# Patient Record
Sex: Female | Born: 1945 | Race: White | Hispanic: No | Marital: Married | Smoking: Never smoker
Health system: Southern US, Community
[De-identification: ages and names within clinical notes are randomized; demographics above are authoritative.]

## PROBLEM LIST (undated history)

## (undated) DIAGNOSIS — M545 Low back pain, unspecified: Secondary | ICD-10-CM

## (undated) DIAGNOSIS — K449 Diaphragmatic hernia without obstruction or gangrene: Secondary | ICD-10-CM

## (undated) DIAGNOSIS — K219 Gastro-esophageal reflux disease without esophagitis: Secondary | ICD-10-CM

## (undated) DIAGNOSIS — E785 Hyperlipidemia, unspecified: Secondary | ICD-10-CM

## (undated) DIAGNOSIS — M3500 Sicca syndrome, unspecified: Secondary | ICD-10-CM

## (undated) DIAGNOSIS — I10 Essential (primary) hypertension: Secondary | ICD-10-CM

## (undated) DIAGNOSIS — E039 Hypothyroidism, unspecified: Secondary | ICD-10-CM

## (undated) DIAGNOSIS — R519 Headache, unspecified: Secondary | ICD-10-CM

## (undated) HISTORY — DX: Gastro-esophageal reflux disease without esophagitis: K21.9

## (undated) HISTORY — PX: SINOSCOPY: SHX187

## (undated) HISTORY — DX: Essential (primary) hypertension: I10

## (undated) HISTORY — DX: Low back pain, unspecified: M54.50

## (undated) HISTORY — PX: OTHER SURGICAL HISTORY: SHX169

## (undated) HISTORY — DX: Hypothyroidism, unspecified: E03.9

## (undated) HISTORY — PX: SALIVARY GLAND SURGERY: SHX768

## (undated) HISTORY — PX: LAPAROSCOPY: SHX197

## (undated) HISTORY — DX: Diaphragmatic hernia without obstruction or gangrene: K44.9

## (undated) HISTORY — PX: BREAST LUMPECTOMY: SHX2

## (undated) HISTORY — PX: TONSILLECTOMY AND ADENOIDECTOMY: SUR1326

## (undated) HISTORY — PX: ADENOIDECTOMY: SUR15

---
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
## 1966-10-10 HISTORY — PX: SALIVARY GLAND SURGERY: SHX768

## 1999-06-02 ENCOUNTER — Other Ambulatory Visit: Admission: RE | Admit: 1999-06-02 | Discharge: 1999-06-02 | Payer: Self-pay | Admitting: *Deleted

## 1999-10-15 ENCOUNTER — Other Ambulatory Visit: Admission: RE | Admit: 1999-10-15 | Discharge: 1999-10-15 | Payer: Self-pay | Admitting: Surgery

## 2000-04-20 ENCOUNTER — Encounter: Payer: Self-pay | Admitting: *Deleted

## 2000-04-20 ENCOUNTER — Encounter: Admission: RE | Admit: 2000-04-20 | Discharge: 2000-04-20 | Payer: Self-pay | Admitting: *Deleted

## 2000-06-09 ENCOUNTER — Other Ambulatory Visit: Admission: RE | Admit: 2000-06-09 | Discharge: 2000-06-09 | Payer: Self-pay | Admitting: *Deleted

## 2001-04-30 ENCOUNTER — Encounter: Admission: RE | Admit: 2001-04-30 | Discharge: 2001-04-30 | Payer: Self-pay | Admitting: *Deleted

## 2001-04-30 ENCOUNTER — Encounter: Payer: Self-pay | Admitting: *Deleted

## 2001-06-26 ENCOUNTER — Other Ambulatory Visit: Admission: RE | Admit: 2001-06-26 | Discharge: 2001-06-26 | Payer: Self-pay | Admitting: *Deleted

## 2002-06-11 ENCOUNTER — Encounter: Admission: RE | Admit: 2002-06-11 | Discharge: 2002-06-11 | Payer: Self-pay | Admitting: *Deleted

## 2002-06-11 ENCOUNTER — Encounter: Payer: Self-pay | Admitting: *Deleted

## 2003-07-08 ENCOUNTER — Encounter: Admission: RE | Admit: 2003-07-08 | Discharge: 2003-07-08 | Payer: Self-pay | Admitting: *Deleted

## 2003-07-08 ENCOUNTER — Encounter: Payer: Self-pay | Admitting: *Deleted

## 2004-10-10 HISTORY — PX: NASAL SINUS SURGERY: SHX719

## 2004-10-26 ENCOUNTER — Encounter: Admission: RE | Admit: 2004-10-26 | Discharge: 2004-10-26 | Payer: Self-pay | Admitting: *Deleted

## 2005-03-31 ENCOUNTER — Encounter: Admission: RE | Admit: 2005-03-31 | Discharge: 2005-03-31 | Payer: Self-pay | Admitting: Otolaryngology

## 2005-03-31 IMAGING — CT CT NECK W/ CM
1 of 2 series · 9 of 14 positions shown, 12 images · IV contrast (OMNIOPAQUE 75CC)
Comparison: none

CLINICAL DATA: Mass at the posterior auricular region, question left submandibular calculus.  Prior salivary gland infection.
NECK CT WITH CONTRAST ? [DATE]:

[Series 2: neck · axial · 0.39mm/px · z∈[-37,+173]mm · 9 of 106 slices shown, 12 images]
[im 11/106  soft-tissue]
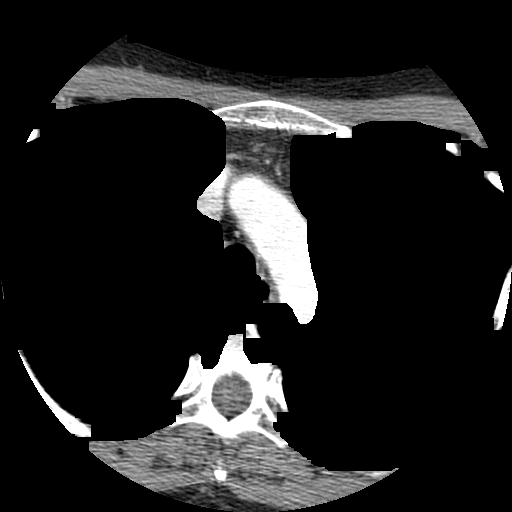
[im 11/106  bone]
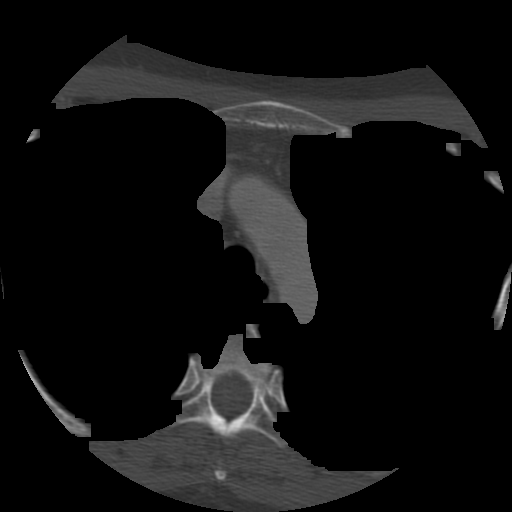
[im 22/106  bone]
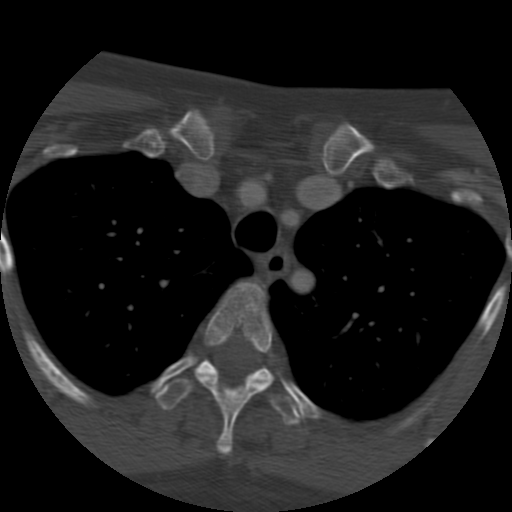
[im 32/106  bone]
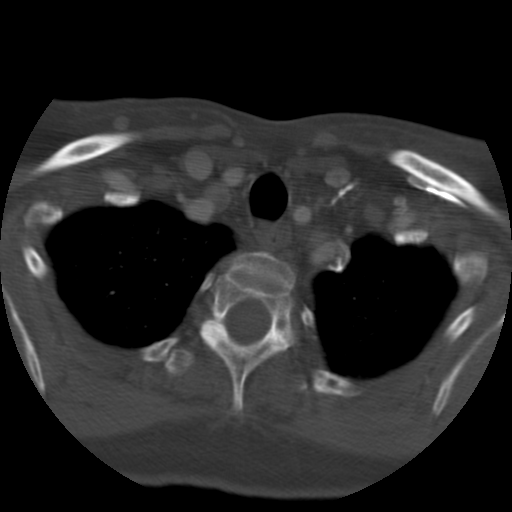
[im 43/106  bone]
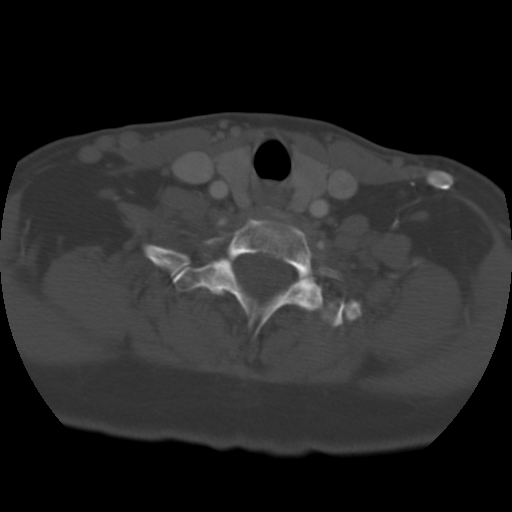
[im 53/106  soft-tissue]
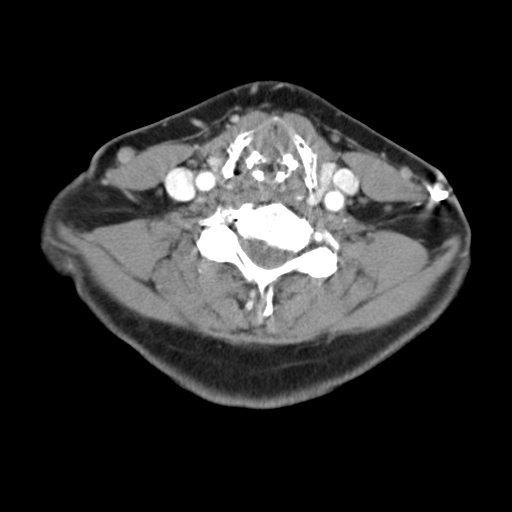
[im 53/106  bone]
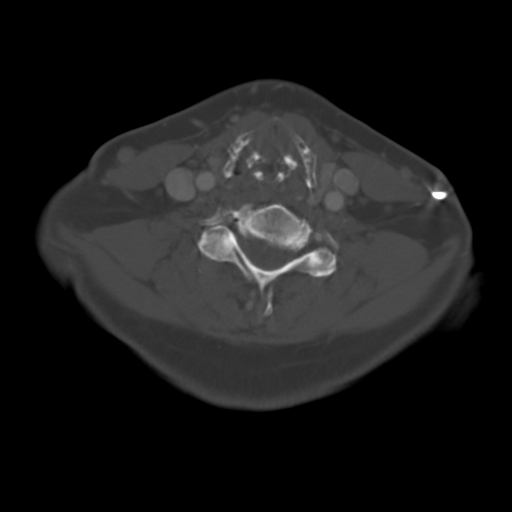
[im 64/106  bone]
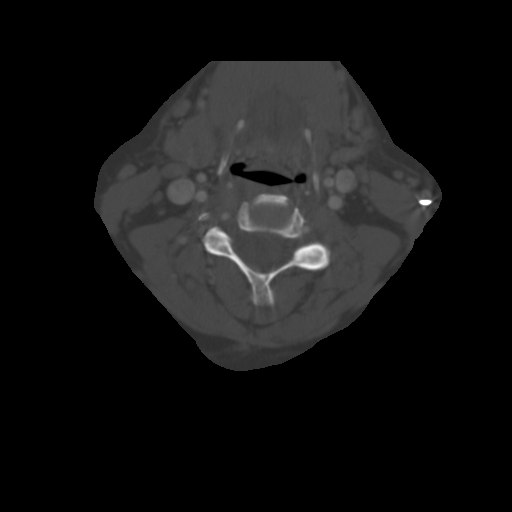
[im 74/106  bone]
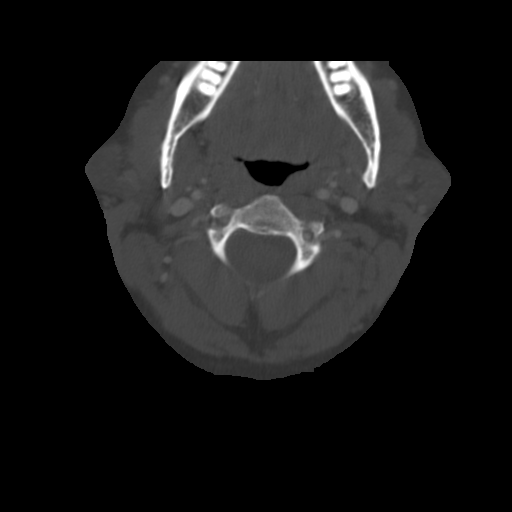
[im 85/106  bone]
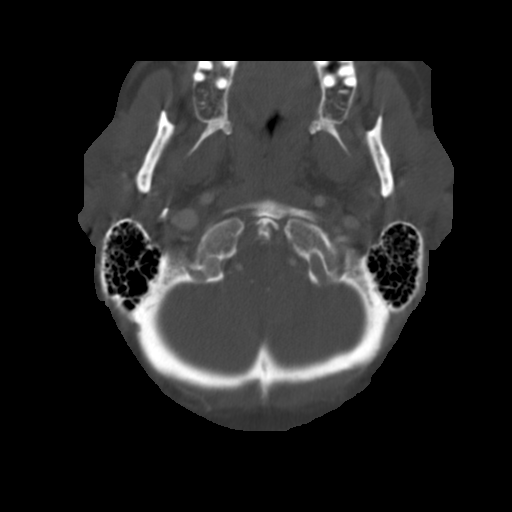
[im 95/106  soft-tissue]
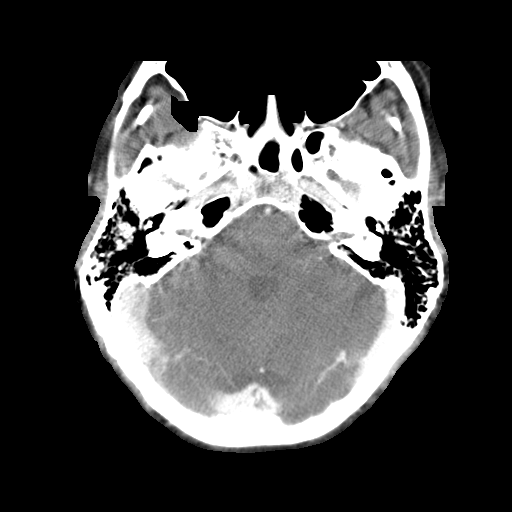
[im 95/106  bone]
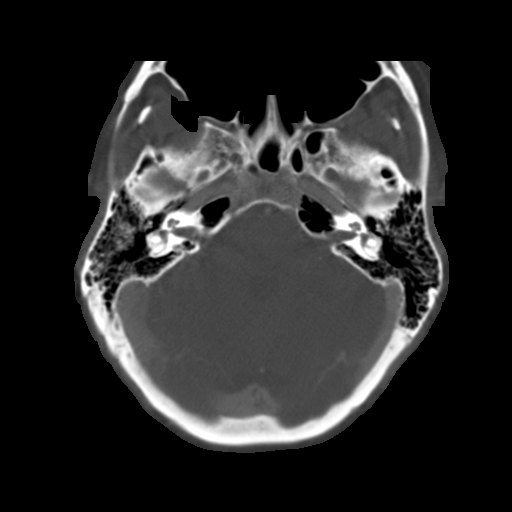

[9 of 14 positions shown; findings below may reference images not displayed]

FINDINGS: Multidetector spiral axial images were obtained from the level of the orbits to the main pulmonary artery with IV injection of 75 cc of Omnipaque 300.  The left submandibular gland has been surgically removed.  Bilateral carotid and right submandibular glands appear normal by CT assessment with no calculus, focal collection, nor mass/adenopathy.  omplete opacification of the right sphenoid sinus is seen with slight mucosal thickening at the inferior maxillary sinuses with the remaining visualized paranasal sinuses, middle ear cavities, and bilateral mastoid air cells clear.  No significant cervical nor superior mediastinal adenopathy is seen with largest lymph nodes seen at the right III internal jugular chain measuring 6 x 10 mm (image 43).  Lung apices are clear.
IMPRESSION: 1.  Post left submandibular gland resection.
2.  complete opacification of the right sphenoid sinus with slight mucosal thickening posterior left maxillary antrum and probable small subcentimeter mucus retention cyst at the inferior right maxillary antrum.
3.  Otherwise negative.

## 2005-06-08 ENCOUNTER — Encounter: Admission: RE | Admit: 2005-06-08 | Discharge: 2005-06-08 | Payer: Self-pay | Admitting: Otolaryngology

## 2005-06-08 IMAGING — CT CT MAXILLOFACIAL W/O CM
1 of 2 series · 15 of 30 positions shown, 19 images · non-contrast
Comparison: none

CLINICAL DATA: Right sphenoid sinusitis.  Pain, chronic drainage.
 CT SINUSES, FULL, WITH INSTATRAK:
 Multidetector helical scans through the sinuses were performed in the prone coronal and axial plane. InstaTrak device was utilized.  Compared to a CT of the neck of [DATE], there is little change in almost complete opacification of the right partition of the sphenoid sinus.  There is bony thickening of the walls of the right partition of the sphenoid sinus, most likely indicating a chronic sphenoid sinusitis.  The left partition of the sphenoid sinus is well pneumatized. The ethmoid air cells are normally pneumatized.  There is mild mucosal thickening of the posterior superior left maxillary sinus with probable retention cyst in the inferior right anterior maxillary sinus.  The frontal sinuses are underdeveloped.  The turbinates are normal in size and the nasal airway is patent.  There is mucosal edema occluding the olfactory recesses, right more so than left.  No bony erosion or destruction is seen.  The osteomeatal complexes are patent bilaterally.

[Series 5: instatrak · axial · 0.49mm/px · z∈[-42,+103]mm · 15 of 128 slices shown, 19 images]
[im 6/128  brain]
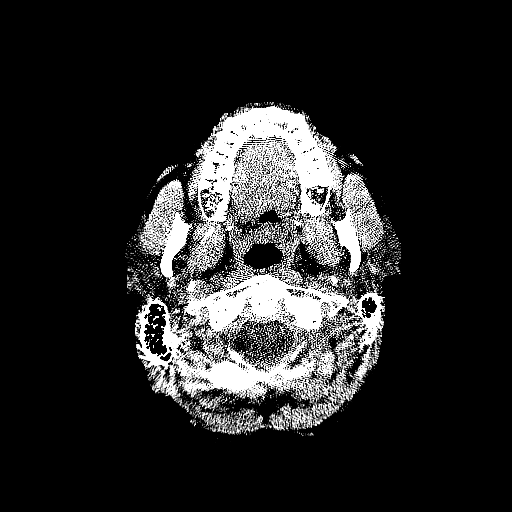
[im 6/128  bone]
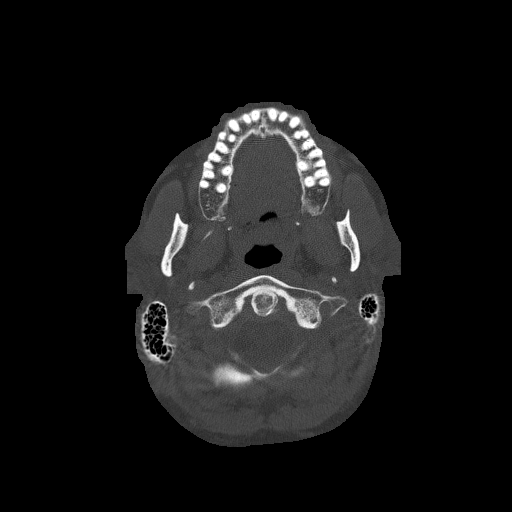
[im 18/128  bone]
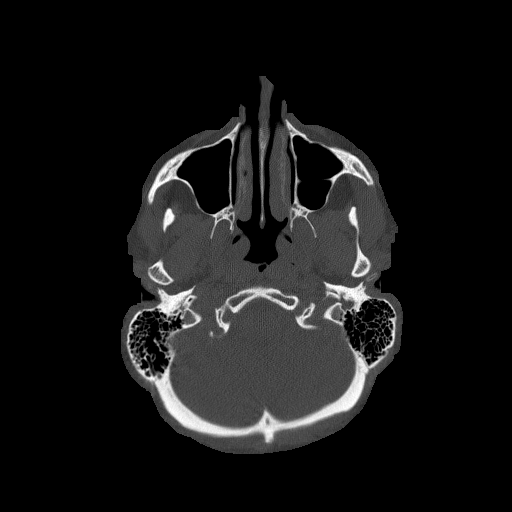
[im 24/128  bone]
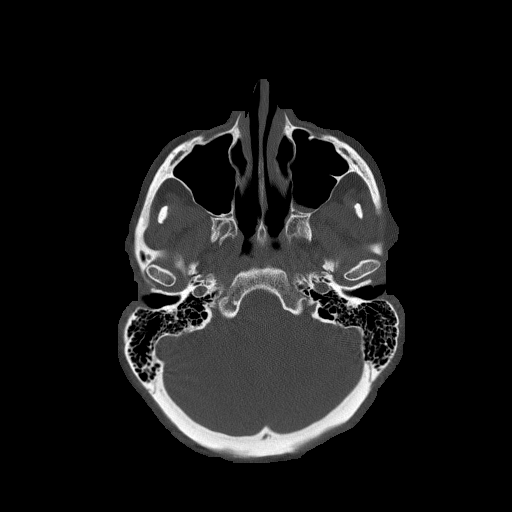
[im 29/128  bone]
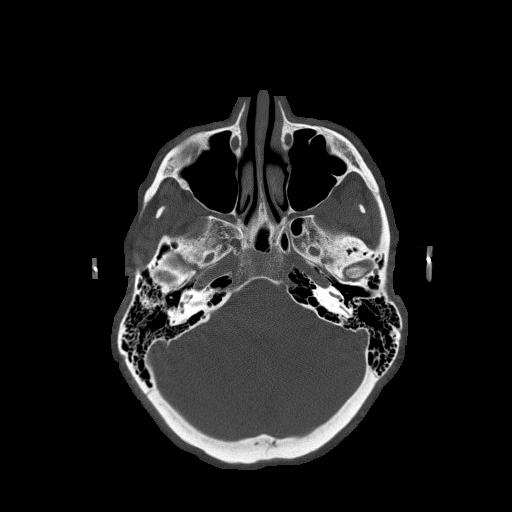
[im 41/128  brain]
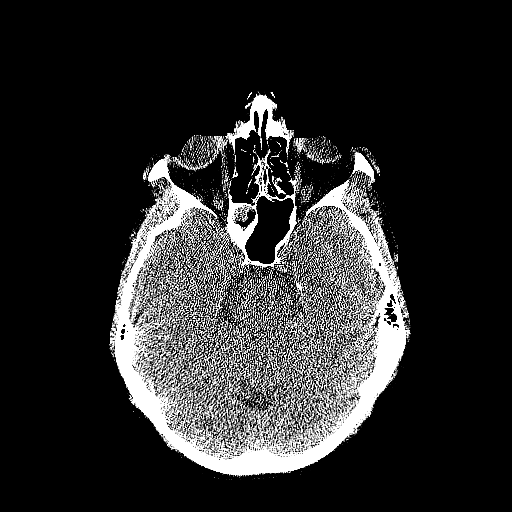
[im 41/128  bone]
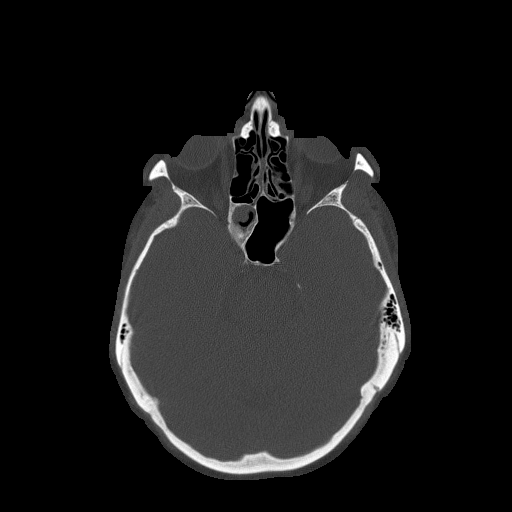
[im 47/128  bone]
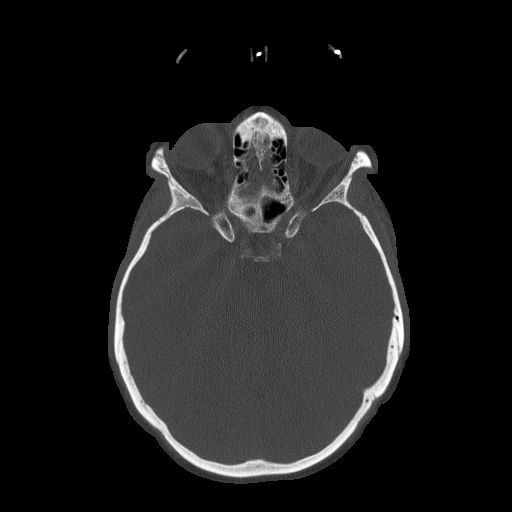
[im 58/128  bone]
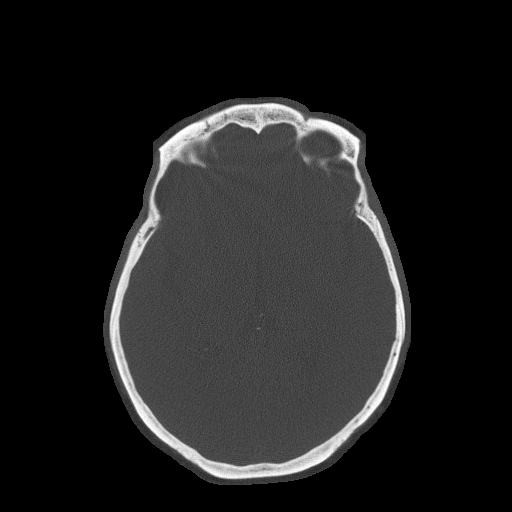
[im 64/128  bone]
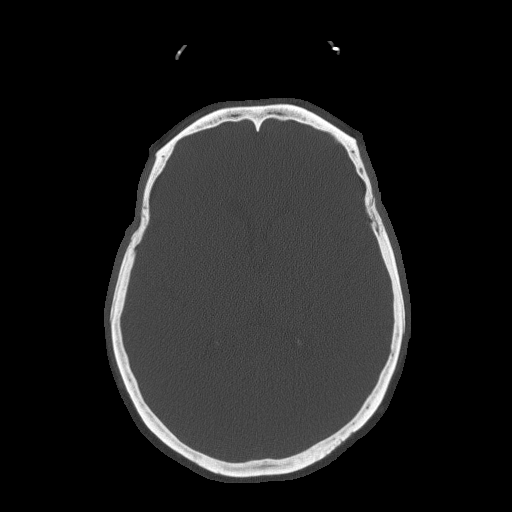
[im 70/128  brain]
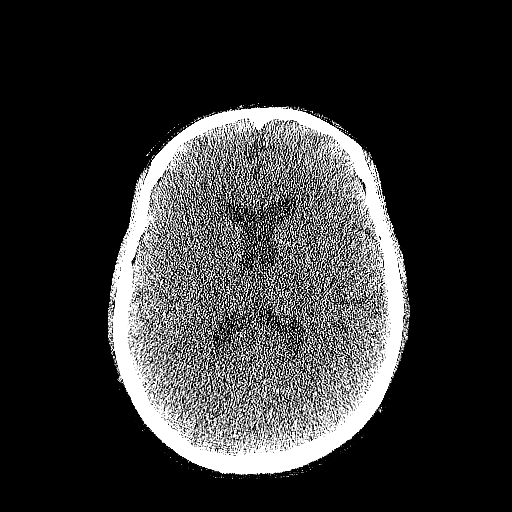
[im 70/128  bone]
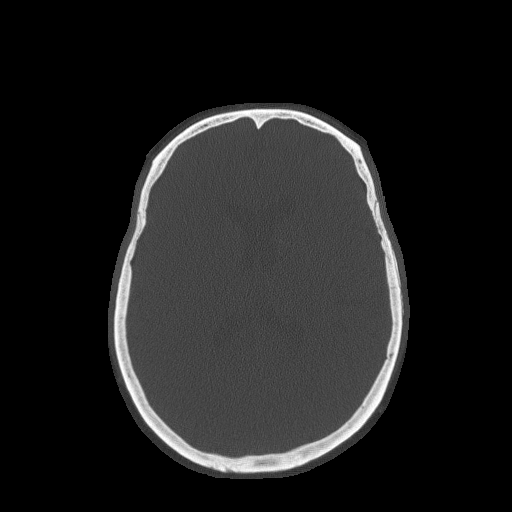
[im 81/128  bone]
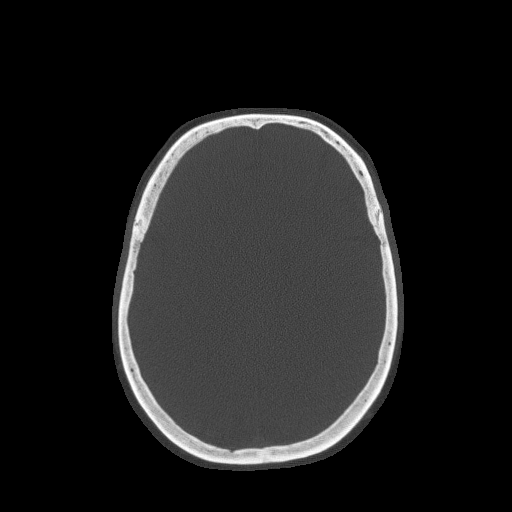
[im 87/128  bone]
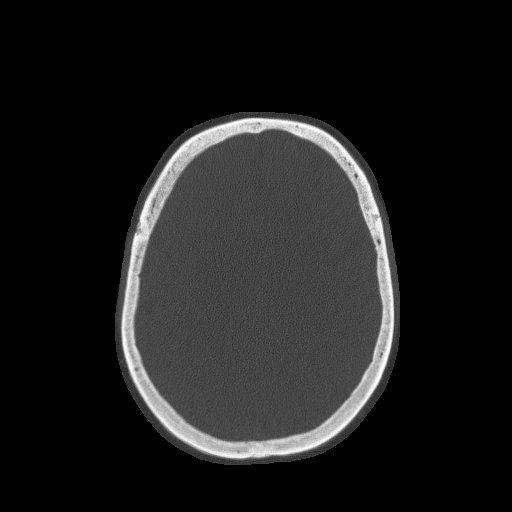
[im 99/128  bone]
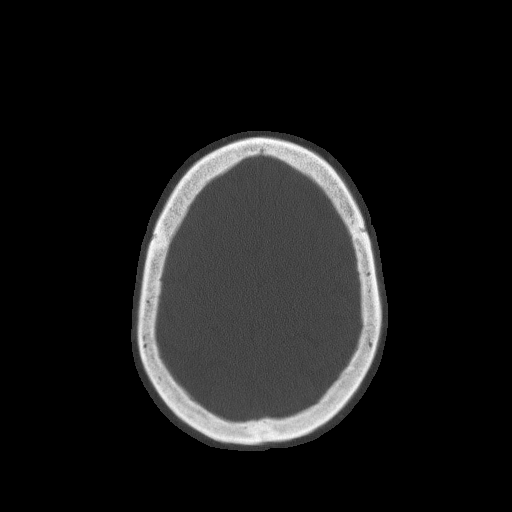
[im 104/128  brain]
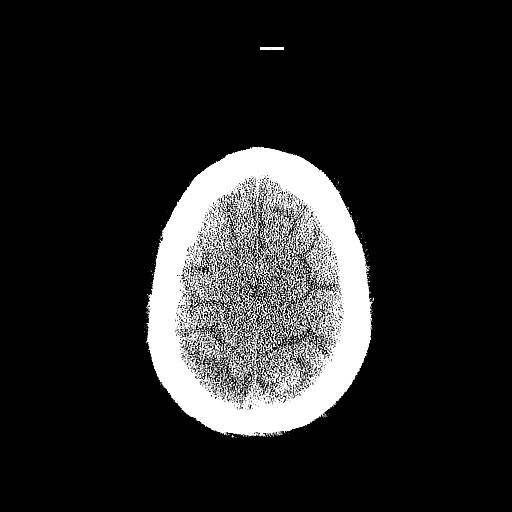
[im 104/128  bone]
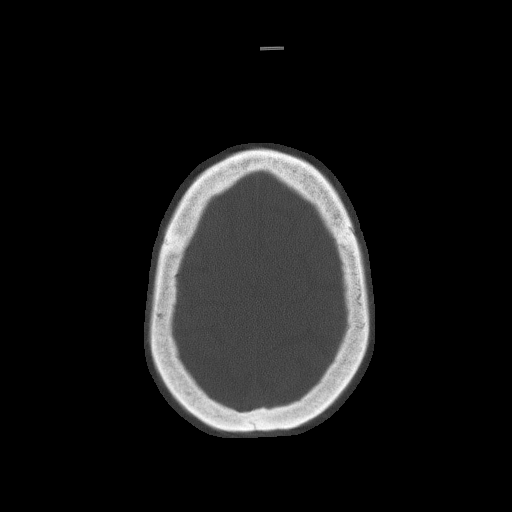
[im 110/128  bone]
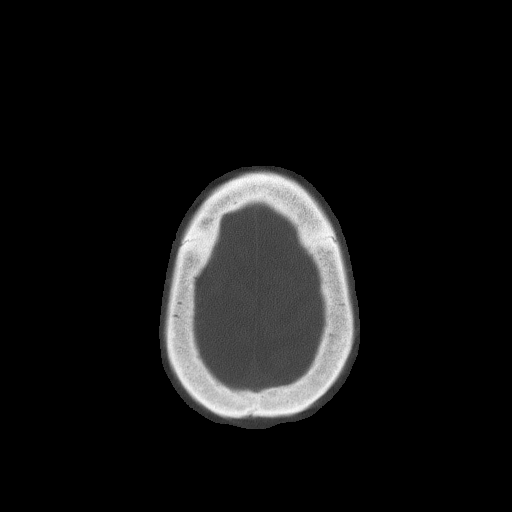
[im 122/128  bone]
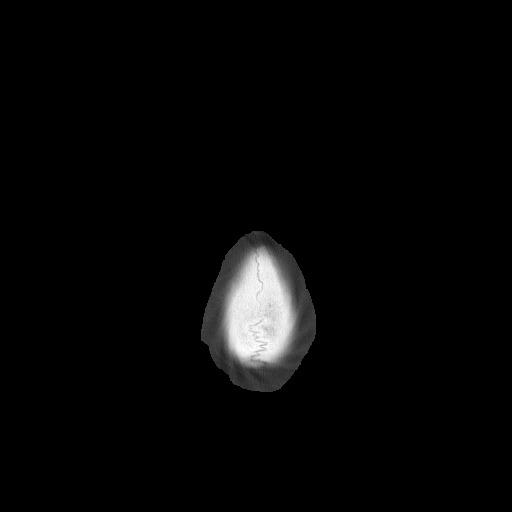

[15 of 30 positions shown; findings below may reference images not displayed]

IMPRESSION: 1.  Probable chronic right sphenoid sinusitis.
 2.  Focal mucosal thickening in posterior left maxillary sinus with probable retention cyst in the floor of the right maxillary sinus.
 3.  Mucosal edema occludes the olfactory recesses.

## 2009-10-10 HISTORY — PX: OTHER SURGICAL HISTORY: SHX169

## 2017-08-18 DIAGNOSIS — H5213 Myopia, bilateral: Secondary | ICD-10-CM | POA: Diagnosis not present

## 2018-01-03 DIAGNOSIS — M3500 Sicca syndrome, unspecified: Secondary | ICD-10-CM | POA: Diagnosis not present

## 2018-01-03 DIAGNOSIS — E663 Overweight: Secondary | ICD-10-CM | POA: Diagnosis not present

## 2018-01-03 DIAGNOSIS — Z6829 Body mass index (BMI) 29.0-29.9, adult: Secondary | ICD-10-CM | POA: Diagnosis not present

## 2018-01-03 DIAGNOSIS — D8989 Other specified disorders involving the immune mechanism, not elsewhere classified: Secondary | ICD-10-CM | POA: Diagnosis not present

## 2018-01-22 DIAGNOSIS — M3509 Sicca syndrome with other organ involvement: Secondary | ICD-10-CM | POA: Diagnosis not present

## 2018-10-12 DIAGNOSIS — H5213 Myopia, bilateral: Secondary | ICD-10-CM | POA: Diagnosis not present

## 2019-09-11 ENCOUNTER — Other Ambulatory Visit: Payer: Self-pay | Admitting: Family Medicine

## 2019-09-11 DIAGNOSIS — Z1231 Encounter for screening mammogram for malignant neoplasm of breast: Secondary | ICD-10-CM

## 2019-12-03 DIAGNOSIS — H524 Presbyopia: Secondary | ICD-10-CM | POA: Diagnosis not present

## 2019-12-24 DIAGNOSIS — Z1211 Encounter for screening for malignant neoplasm of colon: Secondary | ICD-10-CM | POA: Diagnosis not present

## 2019-12-24 DIAGNOSIS — Z0000 Encounter for general adult medical examination without abnormal findings: Secondary | ICD-10-CM | POA: Diagnosis not present

## 2019-12-24 DIAGNOSIS — M3500 Sicca syndrome, unspecified: Secondary | ICD-10-CM | POA: Diagnosis not present

## 2019-12-24 DIAGNOSIS — I10 Essential (primary) hypertension: Secondary | ICD-10-CM | POA: Diagnosis not present

## 2020-01-01 DIAGNOSIS — Z1211 Encounter for screening for malignant neoplasm of colon: Secondary | ICD-10-CM | POA: Diagnosis not present

## 2020-02-11 DIAGNOSIS — K625 Hemorrhage of anus and rectum: Secondary | ICD-10-CM | POA: Diagnosis not present

## 2020-02-11 DIAGNOSIS — R195 Other fecal abnormalities: Secondary | ICD-10-CM | POA: Diagnosis not present

## 2020-03-26 DIAGNOSIS — R030 Elevated blood-pressure reading, without diagnosis of hypertension: Secondary | ICD-10-CM | POA: Diagnosis not present

## 2020-03-26 DIAGNOSIS — R040 Epistaxis: Secondary | ICD-10-CM | POA: Diagnosis not present

## 2020-03-26 DIAGNOSIS — M3500 Sicca syndrome, unspecified: Secondary | ICD-10-CM | POA: Diagnosis not present

## 2020-03-27 ENCOUNTER — Encounter (HOSPITAL_COMMUNITY): Payer: Self-pay

## 2020-03-27 ENCOUNTER — Other Ambulatory Visit: Payer: Self-pay

## 2020-03-27 ENCOUNTER — Emergency Department (HOSPITAL_COMMUNITY)
Admission: EM | Admit: 2020-03-27 | Discharge: 2020-03-27 | Disposition: A | Discharge status: Home / Self Care | Payer: Medicare Other | Attending: Emergency Medicine | Admitting: Emergency Medicine

## 2020-03-27 DIAGNOSIS — Z79899 Other long term (current) drug therapy: Secondary | ICD-10-CM | POA: Diagnosis not present

## 2020-03-27 DIAGNOSIS — I10 Essential (primary) hypertension: Secondary | ICD-10-CM | POA: Insufficient documentation

## 2020-03-27 DIAGNOSIS — I160 Hypertensive urgency: Secondary | ICD-10-CM | POA: Diagnosis not present

## 2020-03-27 HISTORY — DX: Sjogren syndrome, unspecified: M35.00

## 2020-03-27 LAB — CBC WITH DIFFERENTIAL/PLATELET
Abs Immature Granulocytes: 0.06 10*3/uL (ref 0.00–0.07)
Basophils Absolute: 0.1 10*3/uL (ref 0.0–0.1)
Basophils Relative: 1 %
Eosinophils Absolute: 0.1 10*3/uL (ref 0.0–0.5)
Eosinophils Relative: 1 %
HCT: 40.7 % (ref 36.0–46.0)
Hemoglobin: 13.4 g/dL (ref 12.0–15.0)
Immature Granulocytes: 1 %
Lymphocytes Relative: 20 %
Lymphs Abs: 1.9 10*3/uL (ref 0.7–4.0)
MCH: 30.1 pg (ref 26.0–34.0)
MCHC: 32.9 g/dL (ref 30.0–36.0)
MCV: 91.5 fL (ref 80.0–100.0)
Monocytes Absolute: 0.7 10*3/uL (ref 0.1–1.0)
Monocytes Relative: 8 %
Neutro Abs: 6.8 10*3/uL (ref 1.7–7.7)
Neutrophils Relative %: 69 %
Platelets: 233 10*3/uL (ref 150–400)
RBC: 4.45 MIL/uL (ref 3.87–5.11)
RDW: 12.7 % (ref 11.5–15.5)
WBC: 9.6 10*3/uL (ref 4.0–10.5)
nRBC: 0 % (ref 0.0–0.2)

## 2020-03-27 LAB — COMPREHENSIVE METABOLIC PANEL
ALT: 24 U/L (ref 0–44)
AST: 24 U/L (ref 15–41)
Albumin: 4.3 g/dL (ref 3.5–5.0)
Alkaline Phosphatase: 65 U/L (ref 38–126)
Anion gap: 13 (ref 5–15)
BUN: 11 mg/dL (ref 8–23)
CO2: 24 mmol/L (ref 22–32)
Calcium: 9.2 mg/dL (ref 8.9–10.3)
Chloride: 104 mmol/L (ref 98–111)
Creatinine, Ser: 0.91 mg/dL (ref 0.44–1.00)
GFR calc Af Amer: >60 mL/min (ref >60)
GFR calc non Af Amer: >60 mL/min (ref >60)
Glucose, Bld: 118 mg/dL — ABNORMAL HIGH (ref 70–99)
Potassium: 3.4 mmol/L — ABNORMAL LOW (ref 3.5–5.1)
Sodium: 141 mmol/L (ref 135–145)
Total Bilirubin: 1.8 mg/dL — ABNORMAL HIGH (ref 0.3–1.2)
Total Protein: 7.6 g/dL (ref 6.5–8.1)

## 2020-03-27 MED ORDER — AMLODIPINE BESYLATE 5 MG PO TABS
5.0000 mg | ORAL_TABLET | Freq: Every day | ORAL | 0 refills | Status: DC
Start: 2020-03-28 — End: 2020-06-23

## 2020-03-27 MED ORDER — POTASSIUM CHLORIDE CRYS ER 20 MEQ PO TBCR
40.0000 meq | EXTENDED_RELEASE_TABLET | Freq: Once | ORAL | Status: AC
  Administered 2020-03-27: 40 meq via ORAL
  Filled 2020-03-27: qty 2

## 2020-03-27 MED ORDER — AMLODIPINE BESYLATE 5 MG PO TABS
5.0000 mg | ORAL_TABLET | Freq: Once | ORAL | Status: AC
  Administered 2020-03-27: 5 mg via ORAL
  Filled 2020-03-27: qty 1

## 2020-03-27 NOTE — ED Triage Notes (Signed)
Pt arrives POV for eval of hypertension from PCP. Pt was seen at [facility] yesterday for nosebleed, had rhino rocket placed. Seen at PCP today and sent over here for systolic >200. Denies thinners, denies hx of HTN

## 2020-03-27 NOTE — ED Notes (Signed)
Patient verbalizes understanding of discharge instructions. Opportunity for questioning and answers were provided. Pt discharged from ED. 

## 2020-03-27 NOTE — ED Provider Notes (Signed)
[facility] EMERGENCY DEPARTMENT Provider Note   CSN: [id number] Arrival date & time: 03/27/20  1406     History Chief Complaint  Patient presents with  . Hypertension    Regina Marsh is a 74 y.o. female.  HPI 74 year old female presents with hypertension.  Sent over by her PCPs office.  Yesterday she went to urgent care because she was having a right-sided nosebleed.  They placed a Rhino Rocket.  At that time her husband states her blood pressure was measuring around 220.  She has never had hypertension before and in April and March her blood pressure was around 130-140 at her PCPs office.  Went to her PCP today to have the packing removed and her blood pressure was very elevated so they sent her here because they were worried she was started bleeding again.  The patient has noticed intermittent minimal bleeding but none now.  She denies headache, vision changes, chest pain, shortness of breath.   Past Medical History:  Diagnosis Date  . Sjogren's disease (HCC)     There are no problems to display for this patient.   History reviewed. No pertinent surgical history.   OB History   No obstetric history on file.     No family history on file.  Social History   Tobacco Use  . Smoking status: Never Smoker  . Smokeless tobacco: Never Used  Substance Use Topics  . Alcohol use: Not Currently  . Drug use: Not Currently    Home Medications Prior to Admission medications   Medication Sig Start Date End Date Taking? Authorizing Provider  ibuprofen (ADVIL) 200 MG tablet Take 400 mg by mouth every 6 (six) hours as needed for mild pain.   Yes [provider]  Propylene Glycol (SYSTANE BALANCE OP) Apply 1 drop to eye in the morning and at bedtime.   Yes [provider]  PSEUDOEPHEDRINE-ACETAMINOPHEN PO Take 1 tablet by mouth daily as needed (for allergy).   Yes [provider]  amLODipine (NORVASC) 5 MG tablet Take 1 tablet (5 mg  total) by mouth daily. 03/28/20   Pricilla Loveless, MD    Allergies    African Lysbeth Penner Carrie Mew gabonensis] and Penicillins  Review of Systems   Review of Systems  HENT: Positive for nosebleeds.   Eyes: Negative for visual disturbance.  Respiratory: Negative for shortness of breath.   Cardiovascular: Negative for chest pain.  Neurological: Negative for headaches.  All other systems reviewed and are negative.   Physical Exam Updated Vital Signs BP (!) 214/91   Pulse 74   Temp 98.2 F (36.8 C) (Oral)   Resp 20   Ht 5\' 2"  (1.575 m)   Wt 74.8 kg   SpO2 97%   BMI 30.18 kg/m   Physical Exam Vitals and nursing note reviewed.  Constitutional:      General: She is not in acute distress.    Appearance: She is well-developed.  HENT:     Head: Normocephalic and atraumatic.     Right Ear: External ear normal.     Left Ear: External ear normal.     Nose: Nose normal.     Comments: Right sided nasal packing. No current bleeding Eyes:     General:        Right eye: No discharge.        Left eye: No discharge.     Extraocular Movements: Extraocular movements intact.     Pupils: Pupils are equal, round, and reactive to  light.  Cardiovascular:     Rate and Rhythm: Normal rate and regular rhythm.     Heart sounds: Normal heart sounds.  Pulmonary:     Effort: Pulmonary effort is normal.     Breath sounds: Normal breath sounds.  Abdominal:     Palpations: Abdomen is soft.     Tenderness: There is no abdominal tenderness.  Skin:    General: Skin is warm and dry.  Neurological:     Mental Status: She is alert.     Comments: CN 3-12 grossly intact. 5/5 strength in all 4 extremities. Grossly normal sensation. Normal finger to nose.   Psychiatric:        Mood and Affect: Mood is not anxious.     ED Results / Procedures / Treatments   Labs (all labs ordered are listed, but only abnormal results are displayed) Labs Reviewed  COMPREHENSIVE METABOLIC PANEL - Abnormal; Notable for  the following components:      Result Value   Potassium 3.4 (*)    Glucose, Bld 118 (*)    Total Bilirubin 1.8 (*)    All other components within normal limits  CBC WITH DIFFERENTIAL/PLATELET    EKG EKG Interpretation  Date/Time:  Friday March 27 2020 14:13:16 EDT Ventricular Rate:  89 PR Interval:  216 QRS Duration: 86 QT Interval:  410 QTC Calculation: 498 R Axis:   49 Text Interpretation: Sinus rhythm with 1st degree A-V block Cannot rule out Anterior infarct , age undetermined Abnormal ECG No old tracing to compare Confirmed by Sherwood Gambler [phone]) on 03/27/2020 3:09:43 PM   Radiology No results found.  Procedures Procedures (including critical care time)  Medications Ordered in ED Medications  potassium chloride SA (KLOR-CON) CR tablet 40 mEq (has no administration in time range)  amLODipine (NORVASC) tablet 5 mg (has no administration in time range)    ED Course  I have reviewed the triage vital signs and the nursing notes.  Pertinent labs & imaging results that were available during my care of the patient were reviewed by me and considered in my medical decision making (see chart for details).    MDM Rules/Calculators/A&P                          Patient currently is asymptomatic from her hypertension.  While it is impressively elevated, I discussed risk/benefits of acute lowering of her blood pressure.  She agrees with oral blood pressure control and following up closely with PCP, already has follow-up in 3 days.  Otherwise, from a bleeding perspective she is not currently bleeding though it would be a little early to take out her packing.  She will be referred to ear nose throat.  We will start her on Norvasc as she has had some adverse effects from beta-blocker and diuretic before.  Refer back to PCP.  No signs/symptoms of endorgan damage. Final Clinical Impression(s) / ED Diagnoses Final diagnoses:  Hypertensive urgency    Rx / DC Orders ED Discharge  Orders         Ordered    amLODipine (NORVASC) 5 MG tablet  Daily     Discontinue  Reprint     03/27/20 1527           Sherwood Gambler, MD 03/27/20 1539

## 2020-03-30 DIAGNOSIS — I160 Hypertensive urgency: Secondary | ICD-10-CM | POA: Diagnosis not present

## 2020-03-31 DIAGNOSIS — R030 Elevated blood-pressure reading, without diagnosis of hypertension: Secondary | ICD-10-CM | POA: Diagnosis not present

## 2020-03-31 DIAGNOSIS — R040 Epistaxis: Secondary | ICD-10-CM | POA: Diagnosis not present

## 2020-04-02 DIAGNOSIS — I10 Essential (primary) hypertension: Secondary | ICD-10-CM | POA: Diagnosis not present

## 2020-04-03 DIAGNOSIS — I10 Essential (primary) hypertension: Secondary | ICD-10-CM | POA: Diagnosis not present

## 2020-05-06 DIAGNOSIS — I10 Essential (primary) hypertension: Secondary | ICD-10-CM | POA: Diagnosis not present

## 2020-05-08 ENCOUNTER — Telehealth: Payer: Self-pay

## 2020-05-08 NOTE — Telephone Encounter (Signed)
Referral notes sent from [facility], Dr. Samara Deist Timberlake's office Phone #: [phone], Fax #: [phone]   Notes sent to scheduling

## 2020-05-15 DIAGNOSIS — Z79899 Other long term (current) drug therapy: Secondary | ICD-10-CM | POA: Diagnosis not present

## 2020-05-15 DIAGNOSIS — I10 Essential (primary) hypertension: Secondary | ICD-10-CM | POA: Diagnosis not present

## 2020-06-23 ENCOUNTER — Encounter: Payer: Self-pay | Admitting: Cardiology

## 2020-06-23 ENCOUNTER — Ambulatory Visit (INDEPENDENT_AMBULATORY_CARE_PROVIDER_SITE_OTHER): Payer: Medicare Other | Admitting: Cardiology

## 2020-06-23 ENCOUNTER — Other Ambulatory Visit: Payer: Self-pay

## 2020-06-23 VITALS — BP 150/88 | HR 83 | Temp 97.5°F | Ht 62.0 in | Wt 167.4 lb

## 2020-06-23 DIAGNOSIS — R072 Precordial pain: Secondary | ICD-10-CM | POA: Diagnosis not present

## 2020-06-23 DIAGNOSIS — R42 Dizziness and giddiness: Secondary | ICD-10-CM

## 2020-06-23 DIAGNOSIS — M3500 Sicca syndrome, unspecified: Secondary | ICD-10-CM | POA: Diagnosis not present

## 2020-06-23 DIAGNOSIS — I10 Essential (primary) hypertension: Secondary | ICD-10-CM | POA: Diagnosis not present

## 2020-06-23 DIAGNOSIS — Z7189 Other specified counseling: Secondary | ICD-10-CM

## 2020-06-23 MED ORDER — LISINOPRIL-HYDROCHLOROTHIAZIDE 10-12.5 MG PO TABS: 1.0000 | ORAL_TABLET | Freq: Every day | ORAL | 11 refills | Status: DC

## 2020-06-23 NOTE — Patient Instructions (Signed)
Medication Instructions:  Stop Amlodipine 10 mg Start Lisinopril-Hydrochlorothiazide 10-12.5 mg daily   *If you need a refill on your cardiac medications before your next appointment, please call your pharmacy*   Lab Work: None ordered   Testing/Procedures: None ordered    Follow-Up: At [facility], you and your health needs are our priority.  As part of our continuing mission to provide you with exceptional heart care, we have created designated Provider Care Teams.  These Care Teams include your primary Cardiologist (physician) and Advanced Practice Providers (APPs -  Physician Assistants and Nurse Practitioners) who all work together to provide you with the care you need, when you need it.  We recommend signing up for the patient portal called "MyChart".  Sign up information is provided on this After Visit Summary.  MyChart is used to connect with patients for Virtual Visits (Telemedicine).  Patients are able to view lab/test results, encounter notes, upcoming appointments, etc.  Non-urgent messages can be sent to your provider as well.   To learn more about what you can do with MyChart, go to [email or web address].    Your next appointment:   4-6 week(s)  The format for your next appointment:   Virtual Visit   Provider:   Jodelle Red, MD

## 2020-06-23 NOTE — Progress Notes (Signed)
Cardiology Office Note:    Date:  06/23/2020   ID:  Regina Marsh, DOB 11-29-1945, MRN [id number]  PCP:  Shon Hale, MD  Cardiologist:  Jodelle Red, MD  Referring MD: Shon Hale, *   CC: new patient evaluation for hypertension  History of Present Illness:    Regina Marsh is a 74 y.o. 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  I do not have any notes available from Dr. Chanetta Marshall, but I do have a faxed record of metanephrines that are within normal limits (metanephrine 48, normetanephrine 141). I did review the ER note from 03/27/20. Noted to have elevated blood pressure after she had nasal packing for a nosebleed. No associated symptoms. BP noted as 214/91 at that time. Noted to be taking pseudophedrine at home. Started on amlodipine.  Patient history: Here with her husband today.   Had ruptured a vessel in her nose after being outside, started sneezing and then profusely bleeding. Was taking sudafed at the time, no longer taking. Saw ENT, had vessel cauterized.   Brings a list of questions, medications/supplements, and BP logs. Range has been 135/72-160/78. No low numbers. Most average in the 140/70 range.   Was on lisinopril-HCTZ 10-12.5 mg daily, stopped for unclear reasons. Didn't notice any change in symptoms when this was stopped, except perhaps lightheadedness improved.   Had metanephrines checked for pheochromocytoma, normal.   Has a neighbor who is a physician, asking about POTS. Discussed what this is, doesn't meet criteria.  Symptoms: Lightheadedness happens on a daily basis, but when it happens is inconsistent. No syncope, but feels like she needs to sit down. No clear triggers. Feels motion sick/nausea after eating. No energy, no appetite. Worst feeling after lunch, takes her medication around 8:30 PM. Has calf pain  at night in bed, better when she stands up. Feels that she is generally weaker overall, no focal symptoms.   Notes chest discomfort after exercising. Center of chest, neither sharp/dull. Better with rest. No shortness of breath. No radiation. She can sometimes continue exercising through it, but sometimes she needs to sit down.  Denies shortness of breath at rest or with normal exertion. No PND, orthopnea, or unexpected weight gain. No syncope or palpitations.  Past Medical History:  Diagnosis Date  . Sjogren's disease (HCC)     No past surgical history on file.  Current Medications: Current Outpatient Medications on File Prior to Visit  Medication Sig  . acetaminophen (TYLENOL) 325 MG tablet Take 650 mg by mouth every 6 (six) hours as needed.  . Ascorbic Acid (VITAMIN C) 500 MG CAPS Take by mouth daily.  . Cholecalciferol (D3) 50 MCG (2000 UT) TABS Take by mouth daily.  . Cyanocobalamin (VITAMIN B 12 PO) Take by mouth daily.  . D-Mannose 500 MG CAPS Take by mouth daily.  . Ferrous Sulfate (IRON) 325 (65 Fe) MG TABS Take by mouth daily.  . Melatonin 500 MCG TBDP Take by mouth daily as needed.  . Menatetrenone (VITAMIN K2) 100 MCG TABS Take by mouth daily.  . Multiple Vitamin (MULTIVITAMIN) tablet Take 1 tablet by mouth daily.  . Probiotic Product (PROBIOTIC ADVANCED PO) Take by mouth daily.  [name] Propylene Glycol (SYSTANE BALANCE OP) Apply 1 drop to eye in the morning and at bedtime.   No current facility-administered medications on file prior to visit.     Allergies:  African mango [irvingia gabonensis] and Penicillins   Social History   Tobacco Use  . Smoking status: Never Smoker  . Smokeless tobacco: Never Used  Substance Use Topics  . Alcohol use: Not Currently  . Drug use: Not Currently    Family History: family history is not on file.  ROS:   Please see the history of present illness.  Additional pertinent ROS: Constitutional: Negative for chills, fever, night  sweats, unintentional weight loss  HENT: Negative for ear pain and hearing loss.   Eyes: Negative for loss of vision and eye pain.  Respiratory: Negative for cough, sputum, wheezing.   Cardiovascular: See HPI. Gastrointestinal: Negative for abdominal pain, melena, and hematochezia.  Genitourinary: Negative for dysuria and hematuria.  Musculoskeletal: Negative for falls and myalgias.  Skin: Negative for itching and rash.  Neurological: Negative for focal weakness, focal sensory changes and loss of consciousness.  Endo/Heme/Allergies: Does not bruise/bleed easily.     EKGs/Labs/Other Studies Reviewed:    The following studies were reviewed today: No prior cardiac studies  EKG:  EKG is personally reviewed.  The ekg ordered today demonstrates NSR at 83 bpm with poor r wave progression  Recent Labs: 03/27/2020: ALT 24; BUN 11; Creatinine, Ser 0.91; Hemoglobin 13.4; Platelets 233; Potassium 3.4; Sodium 141  Recent Lipid Panel No results found for: CHOL, TRIG, HDL, CHOLHDL, VLDL, LDLCALC, LDLDIRECT  Physical Exam:    VS:  BP (!) 150/88   Pulse 83   Temp (!) 97.5 F (36.4 C)   Ht 5\' 2"  (1.575 m)   Wt 167 lb 6.4 oz (75.9 kg)   SpO2 95%   BMI 30.62 kg/m     Wt Readings from Last 3 Encounters:  06/23/20 167 lb 6.4 oz (75.9 kg)  03/27/20 165 lb (74.8 kg)    GEN: Well nourished, well developed in no acute distress HEENT: Normal, moist mucous membranes NECK: No JVD CARDIAC: regular rhythm, normal S1 and S2, no rubs or gallops. No murmurs. VASCULAR: Radial and DP pulses 2+ bilaterally. No carotid bruits RESPIRATORY:  Clear to auscultation without rales, wheezing or rhonchi  ABDOMEN: Soft, non-tender, non-distended MUSCULOSKELETAL:  Ambulates independently SKIN: Warm and dry, no edema NEUROLOGIC:  Alert and oriented x 3. No focal neuro deficits noted. PSYCHIATRIC:  Normal affect    ASSESSMENT:    1. Essential hypertension   2. Episodic lightheadedness   3. Precordial pain     4. Sjogren's syndrome, with unspecified organ involvement (HCC)   5. Cardiac risk counseling   6. Counseling on health promotion and disease prevention    PLAN:    Hypertension: slightly elevated today, but not severe. Has many nonspecific symptoms that she feels all started after initial diagnosis of hypertension -was for a time on amlodipine, lisinopril-HCTZ. Lisinopril-HCTZ was stopped without much change in symptoms. She wants to try to simplify and would like to try coming off the amlodipine and back to the lisinopril-HCTZ. We will do this today.  Episodic lightheadedness: largely positional. Discussed slow position changes, compression stockings, hydration today  Precordial pain: we discussed this today. Discussed further evaluation, she would like to try changing medications first and then reassess if this improves her symptoms.  History of Sjogren's syndrome: discussed briefly today  Cardiac risk counseling and prevention recommendations: -recommend heart healthy/Mediterranean diet, with whole grains, fruits, vegetable, fish, lean meats, nuts, and olive oil. Limit salt. -recommend moderate walking, 3-5 times/week for 30-50 minutes each session. Aim for at least 150 minutes.week. Goal should be pace of  3 miles/hours, or walking 1.5 miles in 30 minutes -recommend avoidance of tobacco products. Avoid excess alcohol. -ASCVD risk score: The ASCVD Risk score Denman George DC Jr., et al., 2013) failed to calculate for the following reasons:   Cannot find a previous HDL lab   Cannot find a previous total cholesterol lab    Plan for follow up: 4-6 weeks  Jodelle Red, MD, PhD [place]  [facility]    Medication Adjustments/Labs and Tests Ordered: Current medicines are reviewed at length with the patient today.  Concerns regarding medicines are outlined above.  Orders Placed This Encounter  Procedures  . EKG 12-Lead   Meds ordered this encounter  Medications  .  lisinopril-hydrochlorothiazide (ZESTORETIC) 10-12.5 MG tablet    Sig: Take 1 tablet by mouth daily.    Dispense:  30 tablet    Refill:  11    Patient Instructions  Medication Instructions:  Stop Amlodipine 10 mg Start Lisinopril-Hydrochlorothiazide 10-12.5 mg daily   *If you need a refill on your cardiac medications before your next appointment, please call your pharmacy*   Lab Work: None ordered   Testing/Procedures: None ordered    Follow-Up: At [facility], you and your health needs are our priority.  As part of our continuing mission to provide you with exceptional heart care, we have created designated Provider Care Teams.  These Care Teams include your primary Cardiologist (physician) and Advanced Practice Providers (APPs -  Physician Assistants and Nurse Practitioners) who all work together to provide you with the care you need, when you need it.  We recommend signing up for the patient portal called "MyChart".  Sign up information is provided on this After Visit Summary.  MyChart is used to connect with patients for Virtual Visits (Telemedicine).  Patients are able to view lab/test results, encounter notes, upcoming appointments, etc.  Non-urgent messages can be sent to your provider as well.   To learn more about what you can do with MyChart, go to [email or web address].    Your next appointment:   4-6 week(s)  The format for your next appointment:   Virtual Visit   Provider:   Jodelle Red, MD       Signed, Jodelle Red, MD PhD 06/23/2020  [facility]

## 2020-06-25 ENCOUNTER — Encounter: Payer: Self-pay | Admitting: Cardiology

## 2020-06-25 DIAGNOSIS — N390 Urinary tract infection, site not specified: Secondary | ICD-10-CM | POA: Diagnosis not present

## 2020-06-25 DIAGNOSIS — R350 Frequency of micturition: Secondary | ICD-10-CM | POA: Diagnosis not present

## 2020-06-25 DIAGNOSIS — I10 Essential (primary) hypertension: Secondary | ICD-10-CM | POA: Diagnosis not present

## 2020-07-03 DIAGNOSIS — M3500 Sicca syndrome, unspecified: Secondary | ICD-10-CM | POA: Diagnosis not present

## 2020-07-03 DIAGNOSIS — N3289 Other specified disorders of bladder: Secondary | ICD-10-CM | POA: Diagnosis not present

## 2020-07-03 DIAGNOSIS — I10 Essential (primary) hypertension: Secondary | ICD-10-CM | POA: Diagnosis not present

## 2020-07-14 DIAGNOSIS — Z23 Encounter for immunization: Secondary | ICD-10-CM | POA: Diagnosis not present

## 2020-07-21 ENCOUNTER — Telehealth (INDEPENDENT_AMBULATORY_CARE_PROVIDER_SITE_OTHER): Payer: Medicare Other | Admitting: Cardiology

## 2020-07-21 VITALS — BP 149/77 | HR 54 | Ht 62.0 in | Wt 164.0 lb

## 2020-07-21 DIAGNOSIS — I10 Essential (primary) hypertension: Secondary | ICD-10-CM

## 2020-07-21 DIAGNOSIS — Z7189 Other specified counseling: Secondary | ICD-10-CM

## 2020-07-21 DIAGNOSIS — R42 Dizziness and giddiness: Secondary | ICD-10-CM | POA: Diagnosis not present

## 2020-07-21 MED ORDER — LISINOPRIL-HYDROCHLOROTHIAZIDE 10-12.5 MG PO TABS: 2.0000 | ORAL_TABLET | Freq: Every day | ORAL | 11 refills | Status: DC

## 2020-07-21 NOTE — Progress Notes (Signed)
Virtual Visit via Telephone Note   This visit type was conducted due to national recommendations for restrictions regarding the COVID-19 Pandemic (e.g. social distancing) in an effort to limit this patient's exposure and mitigate transmission in our community.  Due to her co-morbid illnesses, this patient is at least at moderate risk for complications without adequate follow up.  This format is felt to be most appropriate for this patient at this time.  The patient did not have access to video technology/had technical difficulties with video requiring transitioning to audio format only (telephone).  All issues noted in this document were discussed and addressed.  No physical exam could be performed with this format.  Please refer to the patient's chart for her  consent to telehealth for [facility].   The patient was identified using 2 identifiers.  Patient Location: Home Provider Location: Home Office  Date:  07/21/2020   ID:  Regina Marsh November 09, 1945, MRN [id number]  PCP:  Regina Smoker, MD  Cardiologist:  Regina Dresser, MD  Referring MD: Regina Marsh, *   CC: follow up  History of Present Illness:    Regina Marsh is a 74 y.o. female with a hx of Sjogren's who is seen for follow up today. I met her 06/23/20 as a new consult at the request of Regina Marsh, * for the evaluation and management of resistant hypertension.  Cardiac history:  metanephrines that are within normal limits (metanephrine 48, normetanephrine 141). ER note from 03/27/20. Noted to have elevated blood pressure after she had nasal packing for a nosebleed. No associated symptoms. BP noted as 214/91 at that time. Noted to be taking pseudophedrine at home. Started on amlodipine.  Today: 149-158 is her typical range of systolic blood pressures since changing from amlodipine to lisinopril-HCTZ.  Lightheadedness a bit better off amlodipine but still present. Occurs after eating,  in the middle of the day. Keeping a food log to look for patterns, no clear associations yet. No abdominal pain. Feels well in the AM, then worst time is about 12-4, then better in the PM. Has come off all her supplements as well, trying to find a trigger. Notes that her blood pressure is a bit higher when she feels poorly, no lows.  Also asking about decongestants. Took sudafed for about 50 years. Discussed avoiding pseudophedrine, phenylephrine particularly. Flonase, antihistamines, coricidin should be ok, but watch blood pressure closely.  She has baseline fatigue, discussed that I would avoid beta blockers given this. Discussed options to try to optimize blood pressure. Will try increasing lisinopril-HCTZ dose.  Denies chest pain, shortness of breath at rest or with normal exertion. No PND, orthopnea, LE edema or unexpected weight gain. No syncope or palpitations.  Past Medical History:  Diagnosis Date  . Sjogren's disease (Regina Marsh)     No past surgical history on file.  Current Medications: Current Outpatient Medications on File Prior to Visit  Medication Sig  . acetaminophen (TYLENOL) 325 MG tablet Take 650 mg by mouth every 6 (six) hours as needed.  . D-Mannose 500 MG CAPS Take by mouth daily.  . Ferrous Sulfate (IRON) 325 (65 Fe) MG TABS Take by mouth daily.  [name] lisinopril-hydrochlorothiazide (ZESTORETIC) 10-12.5 MG tablet Take 1 tablet by mouth daily.  . Melatonin 500 MCG TBDP Take by mouth daily as needed.  . Menatetrenone (VITAMIN K2) 100 MCG TABS Take by mouth daily.  . Multiple Vitamin (MULTIVITAMIN) tablet Take 1 tablet by mouth daily.  . Probiotic Product (PROBIOTIC  ADVANCED PO) Take by mouth daily.  [name] Propylene Glycol (SYSTANE BALANCE OP) Apply 1 drop to eye in the morning and at bedtime.   No current facility-administered medications on file prior to visit.     Allergies:   African mango [irvingia gabonensis] and Penicillins   Social History   Tobacco Use  . Smoking  status: Never Marsh  . Smokeless tobacco: Never Used  Substance Use Topics  . Alcohol use: Not Currently  . Drug use: Not Currently    Family History: No family history of premature CAD  ROS:   Please see the history of present illness.  Additional pertinent ROS otherwise unremarkable.   EKGs/Labs/Other Studies Reviewed:    The following studies were reviewed today: No prior cardiac studies  EKG:  EKG is personally reviewed.  The ekg ordered 06/23/20 demonstrates NSR at 83 bpm with poor r wave progression  Recent Labs: 03/27/2020: ALT 24; BUN 11; Creatinine, Ser 0.91; Hemoglobin 13.4; Platelets 233; Potassium 3.4; Sodium 141  Recent Lipid Panel No results found for: CHOL, TRIG, HDL, CHOLHDL, VLDL, LDLCALC, LDLDIRECT  Physical Exam:    VS:  BP (!) 149/77   Pulse (!) 54   Ht _0  (1.575 m)   Wt 164 lb (74.4 kg)   BMI 30.00 kg/m     Wt Readings from Last 3 Encounters:  07/21/20 164 lb (74.4 kg)  06/23/20 167 lb 6.4 oz (75.9 kg)  03/27/20 165 lb (74.8 kg)    Speaking comfortably on the phone, no audible wheezing In no acute distress Alert and oriented Normal affect Normal speech  ASSESSMENT:    1. Episodic lightheadedness   2. Essential hypertension   3. Cardiac risk counseling   4. Counseling on health promotion and disease prevention    PLAN:    Hypertension:  -improved with stopping amlodipine and returning to lisinopril-HCTZ 10-12.5 mg daily -still above goal. Will increase to 2 tabs of lisinopril-HCTZ daily, for total of 20-28m daily  Episodic lightheadedness: unclear etiology of this. Has cut back supplements, keeping detailed food log. Discussed how to monitor BP at these times as well  History of Sjogren's syndrome  Cardiac risk counseling and prevention recommendations: -recommend heart healthy/Mediterranean diet, with whole grains, fruits, vegetable, fish, lean meats, nuts, and olive oil. Limit salt. -recommend moderate walking, 3-5 times/week  for 30-50 minutes each session. Aim for at least 150 minutes.week. Goal should be pace of 3 miles/hours, or walking 1.5 miles in 30 minutes -recommend avoidance of tobacco products. Avoid excess alcohol. -ASCVD risk score: The ASCVD Risk score (Mikey BussingDC Jr., et al., 2013) failed to calculate for the following reasons:   Cannot find a previous HDL lab   Cannot find a previous total cholesterol lab    Plan for follow up: 2 months  Today, I have spent 19 minutes with the patient with telehealth technology discussing the above problems.  Additional time spent in chart review, documentation, and communication.  Medication Adjustments/Labs and Tests Ordered: Current medicines are reviewed at length with the patient today.  Concerns regarding medicines are outlined above.  No orders of the defined types were placed in this encounter.  Meds ordered this encounter  Medications  . lisinopril-hydrochlorothiazide (ZESTORETIC) 10-12.5 MG tablet    Sig: Take 2 tablets by mouth daily.    Dispense:  30 tablet    Refill:  11    Please wait to fill until patient calls for it. Trialing higher dose    Patient Instructions  Medication  Instructions:  Increase Lisinopril-Hydrochlorothiazide 10-12.5 mg daily  *If you need a refill on your cardiac medications before your next appointment, please call your pharmacy*   Lab Work: None ordered  Testing/Procedures: None ordered    Follow-Up: At [facility], you and your health needs are our priority.  As part of our continuing mission to provide you with exceptional heart care, we have created designated Provider Care Teams.  These Care Teams include your primary Cardiologist (physician) and Advanced Practice Providers (APPs -  Physician Assistants and Nurse Practitioners) who all work together to provide you with the care you need, when you need it.  We recommend signing up for the patient portal called "MyChart".  Sign up information is provided on  this After Visit Summary.  MyChart is used to connect with patients for Virtual Visits (Telemedicine).  Patients are able to view lab/test results, encounter notes, upcoming appointments, etc.  Non-urgent messages can be sent to your provider as well.   To learn more about what you can do with MyChart, go to [email or web address].    Your next appointment:   2 month(s)  The format for your next appointment:   In Person  Provider:   Buford Dresser, MD       Signed, Regina Dresser, MD PhD 07/21/2020  [place]

## 2020-07-21 NOTE — Patient Instructions (Signed)
Medication Instructions:  Increase Lisinopril-Hydrochlorothiazide 10-12.5 mg daily  *If you need a refill on your cardiac medications before your next appointment, please call your pharmacy*   Lab Work: None ordered  Testing/Procedures: None ordered    Follow-Up: At [facility], you and your health needs are our priority.  As part of our continuing mission to provide you with exceptional heart care, we have created designated Provider Care Teams.  These Care Teams include your primary Cardiologist (physician) and Advanced Practice Providers (APPs -  Physician Assistants and Nurse Practitioners) who all work together to provide you with the care you need, when you need it.  We recommend signing up for the patient portal called "MyChart".  Sign up information is provided on this After Visit Summary.  MyChart is used to connect with patients for Virtual Visits (Telemedicine).  Patients are able to view lab/test results, encounter notes, upcoming appointments, etc.  Non-urgent messages can be sent to your provider as well.   To learn more about what you can do with MyChart, go to [email or web address].    Your next appointment:   2 month(s)  The format for your next appointment:   In Person  Provider:   Jodelle Red, MD

## 2020-07-26 ENCOUNTER — Encounter: Payer: Self-pay | Admitting: Cardiology

## 2020-08-13 ENCOUNTER — Ambulatory Visit: Payer: Medicare Other | Admitting: Cardiology

## 2020-08-13 ENCOUNTER — Other Ambulatory Visit: Payer: Self-pay

## 2020-08-13 ENCOUNTER — Encounter: Payer: Self-pay | Admitting: Cardiology

## 2020-08-13 VITALS — BP 160/90 | HR 84

## 2020-08-13 DIAGNOSIS — R42 Dizziness and giddiness: Secondary | ICD-10-CM | POA: Diagnosis not present

## 2020-08-13 DIAGNOSIS — I479 Paroxysmal tachycardia, unspecified: Secondary | ICD-10-CM | POA: Diagnosis not present

## 2020-08-13 DIAGNOSIS — I10 Essential (primary) hypertension: Secondary | ICD-10-CM | POA: Diagnosis not present

## 2020-08-13 DIAGNOSIS — Z7189 Other specified counseling: Secondary | ICD-10-CM | POA: Diagnosis not present

## 2020-08-13 MED ORDER — LISINOPRIL-HYDROCHLOROTHIAZIDE 10-12.5 MG PO TABS: 1.0000 | ORAL_TABLET | Freq: Every day | ORAL | 3 refills | Status: DC

## 2020-08-13 NOTE — Progress Notes (Signed)
Cardiology Office Note:    Date:  08/13/2020   ID:  Regina Marsh, DOB 28-Aug-1946, MRN [id number]  PCP:  Glenis Smoker, MD  Cardiologist:  Buford Dresser, MD  Referring MD: Glenis Smoker, *   Chief Complaint  Patient presents with  . Follow-up  . Headache    History of Present Illness:    Regina Marsh is a 74 y.o. female with a hx of Sjogren's who is seen for follow up today. 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  Cardiac history:  metanephrines that are within normal limits (metanephrine 48, normetanephrine 141). ER note from 03/27/20. Noted to have elevated blood pressure after she had nasal packing for a nosebleed. No associated symptoms. BP noted as 214/91 at that time. Noted to be taking pseudophedrine at home. Started on amlodipine. Changed to lisinopril-HCTZ.  Today:   Received the following letter via mychart. Appt today to discuss. "Email to Dr. Harrell Gave  I know it has only been two weeks since we had the audio visit, but I am having several problems. First, my blood pressure on the two 10 mg dose is going up not down. I am getting fairly consistent readings of 170- 178 over 75-76. The lightheadedness with the two pills is much worse. I am also having stomach problems when I try to eat. It is not nausea, but I just do not feel good after I eat.  I think that I did not fully understand some of the questions you were asking me when we talked. I am having what I call "hot flashes" which I thought were menopause but now I am beginning to wonder if they are something else. I had a very sever episode a few nights ago when I woke burning up and sweating to the point that my gown was so wet, I could wring it out. I was so miserable I did not understand to check my heart rate and only after I had calmed down did I check my pulse and blood pressure. My blood  pressure was up over 200/100 and my pulse was reading 102. When I have one of these "hot flashes" I feel like something is rushing through my body. I quite often also have the lightheadedness when I cool down. But I also have the lightheadedness without having the "hot flashes." I really don't feel my heart beating so I must use a monitor to check my pulse.  Upon reading the paper copy of our phone call I did not realize that putting on weight was an issue I needed to talk to you about. Since I stopped working in early 2020, I have put on about 17 to 18 pounds. I thought it was because I was home and able to cook three meals a day although they were healthy meals and probably better for me that what I was eating at work. I have cut way back on the cooking and what I am eating but not losing the weight.  I had also mentioned having some chest pain which feels like "heartburn" when I am trying to exercise or carry something heavy. When I was working a few months ago I was moving 40 lbs. rolls of cloth and lifting furniture around all day. 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 My pedometer broke but it was normal for me to have 12,000 to 16,000 steps in a day with no problems. Now some days I can walk a mile with no problem and other days I can't make a block.   Addendum: Paulo Fruit (husband)  I have been observing Regina Marsh closely during this process and it has become clear significant parts of the story have not been adequately conveyed to you. At seemingly random times during the day and night Regina Marsh suddenly experiences what appear to be some kind of large hormone dump into her body that completely overwhelm her. If she is on her feet all she can do is try to grab ahold of something to keep from falling. On many occasions I have rushed over to hold her and  prevent a fall. She previously stated that her heart rate did not increase or race during these events, but that has been proven to not be the case. Using a fingertip pulse oximeter, she has gotten a pulse rate as high as 102 bpm (about 60 is her normal rate), and that is 30 seconds or more into the event when things have probably calmed down some. Once when I was right next to her when it happened, I placed my hand on her chest over the heart and felt it beating at what seemed a faster rate than the 102 bpm, though I was not able to get a definite rate count. These "events" are so overwhelming that she is not aware of the heart rate increase, which is why she previously stated there was no increase. The condition that was eliminated from consideration due to not having a heart rate increase needs to be considered.  On more than one occasion I have seen Regina Marsh reduced to tears over what these apparent hormone dumps are doing to her quality of life. The blood pressure meds do not seem to be helping, and the recent doubling of them may have made things worse. Regina Marsh has been experiencing what may have been a somewhat milder form of these hormone events for many years now thinking they were a very extended menopause related thing,  but over the last few months they have become much worse at the same time her blood pressure has become a problem. "  Overall, she has more lightheadedness with the higher doses of medication, but it hasn't helped her blood pressure at all.  We discussed ambulatory blood pressure monitoring and cardiac telemetry. Severe events happen intermittently, can be 3-4 times a week. No clear triggers. Husband feels that this is a hormone dump causing her symptoms. Discussed workup she has had to date.  Past Medical History:  Diagnosis Date  . Sjogren's disease ([place])     History reviewed. No pertinent surgical history.  Current Medications: Current Outpatient Medications on File Prior  to Visit  Medication Sig  . acetaminophen (TYLENOL) 325 MG tablet Take 650 mg by mouth every 6 (six) hours as needed.  [name] lisinopril-hydrochlorothiazide (ZESTORETIC) 10-12.5 MG tablet Take 2 tablets by mouth daily.  . Melatonin 500 MCG TBDP Take by mouth daily as needed.  [name] Propylene Glycol (SYSTANE BALANCE OP) Apply 1 drop to eye in the morning and at bedtime.   No current facility-administered medications on file prior to visit.     Allergies:   African mango [irvingia gabonensis] and Penicillins   Social History   Tobacco Use  . Smoking status:  Never Smoker  . Smokeless tobacco: Never Used  Substance Use Topics  . Alcohol use: Not Currently  . Drug use: Not Currently    Family History: No family history of premature CAD  ROS:   Please see the history of present illness.  Additional pertinent ROS otherwise unremarkable.   EKGs/Labs/Other Studies Reviewed:    The following studies were reviewed today: No prior cardiac studies  EKG:  EKG is personally reviewed.  The ekg ordered 06/23/20 demonstrates NSR at 83 bpm with poor r wave progression  Recent Labs: 03/27/2020: ALT 24; BUN 11; Creatinine, Ser 0.91; Hemoglobin 13.4; Platelets 233; Potassium 3.4; Sodium 141  Recent Lipid Panel No results found for: CHOL, TRIG, HDL, CHOLHDL, VLDL, LDLCALC, LDLDIRECT  Physical Exam:    VS:  BP (!) 160/90 (BP Location: Right Arm, Patient Position: Sitting, Cuff Size: Normal)   Pulse 84     Wt Readings from Last 3 Encounters:  07/21/20 164 lb (74.4 kg)  06/23/20 167 lb 6.4 oz (75.9 kg)  03/27/20 165 lb (74.8 kg)    GEN: Well nourished, well developed in no acute distress HEENT: Normal, moist mucous membranes NECK: No JVD CARDIAC: regular rhythm, normal S1 and S2, no rubs or gallops. No murmur. VASCULAR: Radial and DP pulses 2+ bilaterally. No carotid bruits RESPIRATORY:  Clear to auscultation without rales, wheezing or rhonchi  ABDOMEN: Soft, non-tender,  non-distended MUSCULOSKELETAL:  Ambulates independently SKIN: Warm and dry, no edema NEUROLOGIC:  Alert and oriented x 3. No focal neuro deficits noted. PSYCHIATRIC:  Normal affect   ASSESSMENT:    1. Essential hypertension   2. Dizziness   3. Light headedness   4. Paroxysmal tachycardia (HCC)   5. Cardiac risk counseling    PLAN:    Hypertension Episodic lightheadedness Intermittent tachycardia -reviewed at length, see comments in HPI -she feels that she is worse on the higher dose of lisinopril-HCTZ. Will decrease back to prior dosing -will get ambulatory blood pressure monitoring for further evaluation -reviewed extensive workup to this point -will pursue telemetry monitoring given intermittent episodes of tachycardia  History of Sjogren's syndrome  Cardiac risk counseling and prevention recommendations: -recommend heart healthy/Mediterranean diet, with whole grains, fruits, vegetable, fish, lean meats, nuts, and olive oil. Limit salt. -recommend moderate walking, 3-5 times/week for 30-50 minutes each session. Aim for at least 150 minutes.week. Goal should be pace of 3 miles/hours, or walking 1.5 miles in 30 minutes -recommend avoidance of tobacco products. Avoid excess alcohol. -ASCVD risk score: The ASCVD Risk score Mikey Bussing DC Jr., et al., 2013) failed to calculate for the following reasons:   Cannot find a previous HDL lab   Cannot find a previous total cholesterol lab    Plan for follow up: 4 weeks  Medication Adjustments/Labs and Tests Ordered: Current medicines are reviewed at length with the patient today.  Concerns regarding medicines are outlined above.  Orders Placed This Encounter  Procedures  . LONG TERM MONITOR (3-14 DAYS)  . 24 hour blood pressure monitor   Meds ordered this encounter  Medications  . lisinopril-hydrochlorothiazide (ZESTORETIC) 10-12.5 MG tablet    Sig: Take 1 tablet by mouth daily.    Dispense:  90 tablet    Refill:  3    Please wait  to fill until patient calls for it. Trialing higher dose    Patient Instructions  Medication Instructions:  Decrease Lisinopril-Hydrochlorothiazide 10-12.5 mg to one tablet daily.  *If you need a refill on your cardiac medications before your next appointment,  please call your pharmacy*   Lab Work: None   Testing/Procedures: Our physician has recommended that you wear an Mena monitor. The Zio patch cardiac monitor continuously records heart rhythm data for up to 14 days, this is for patients being evaluated for multiple types heart rhythms. For the first 24 hours post application, please avoid getting the Zio monitor wet in the shower or by excessive sweating during exercise. After that, feel free to carry on with regular activities. Keep soaps and lotions away from the ZIO XT Patch.   Someone from our office will call to verify address and mail monitor.   24 hour home blood pressure monitor ( Will receive a call to schedule appointment). [address]   Follow-Up: At [facility], you and your health needs are our priority.  As part of our continuing mission to provide you with exceptional heart care, we have created designated Provider Care Teams.  These Care Teams include your primary Cardiologist (physician) and Advanced Practice Providers (APPs -  Physician Assistants and Nurse Practitioners) who all work together to provide you with the care you need, when you need it.  We recommend signing up for the patient portal called "MyChart".  Sign up information is provided on this After Visit Summary.  MyChart is used to connect with patients for Virtual Visits (Telemedicine).  Patients are able to view lab/test results, encounter notes, upcoming appointments, etc.  Non-urgent messages can be sent to your provider as well.   To learn more about what you can do with MyChart, go to [email or web address].    Your next appointment:   4 week(s)  The format  for your next appointment:   In Person  Provider:   Buford Dresser, MD      Signed, Buford Dresser, MD PhD 08/13/2020  [place]

## 2020-08-13 NOTE — Patient Instructions (Signed)
Medication Instructions:  Decrease Lisinopril-Hydrochlorothiazide 10-12.5 mg to one tablet daily.  *If you need a refill on your cardiac medications before your next appointment, please call your pharmacy*   Lab Work: None   Testing/Procedures: Our physician has recommended that you wear an 14  DAY ZIO-PATCH monitor. The Zio patch cardiac monitor continuously records heart rhythm data for up to 14 days, this is for patients being evaluated for multiple types heart rhythms. For the first 24 hours post application, please avoid getting the Zio monitor wet in the shower or by excessive sweating during exercise. After that, feel free to carry on with regular activities. Keep soaps and lotions away from the ZIO XT Patch.   Someone from our office will call to verify address and mail monitor.   24 hour home blood pressure monitor ( Will receive a call to schedule appointment). [address]   Follow-Up: At [facility], you and your health needs are our priority.  As part of our continuing mission to provide you with exceptional heart care, we have created designated Provider Care Teams.  These Care Teams include your primary Cardiologist (physician) and Advanced Practice Providers (APPs -  Physician Assistants and Nurse Practitioners) who all work together to provide you with the care you need, when you need it.  We recommend signing up for the patient portal called "MyChart".  Sign up information is provided on this After Visit Summary.  MyChart is used to connect with patients for Virtual Visits (Telemedicine).  Patients are able to view lab/test results, encounter notes, upcoming appointments, etc.  Non-urgent messages can be sent to your provider as well.   To learn more about what you can do with MyChart, go to [email or web address].    Your next appointment:   4 week(s)  The format for your next appointment:   In Person  Provider:   Jodelle Red,  MD

## 2020-08-18 ENCOUNTER — Other Ambulatory Visit (INDEPENDENT_AMBULATORY_CARE_PROVIDER_SITE_OTHER): Payer: Medicare Other

## 2020-08-18 DIAGNOSIS — R42 Dizziness and giddiness: Secondary | ICD-10-CM

## 2020-08-26 ENCOUNTER — Telehealth: Payer: Self-pay | Admitting: *Deleted

## 2020-08-26 NOTE — Telephone Encounter (Signed)
Patient scheduled to have 24 hour ambulatory blood pressure monitor applied at [facility] office, Monday, 09/07/2020 at 10:00 AM. Patient will be accompanied by her husband, Chestine Spore, to assist due to patients episodes of lightheadedness.

## 2020-08-26 NOTE — Telephone Encounter (Signed)
Patient is following up. She states she has a scheduling conflict on 09/07/20. Her husband has an appointment on the same day and she may need to reschedule. Please assist.

## 2020-08-31 NOTE — Telephone Encounter (Signed)
Patients appointment changed to 2:00 PM on 09/07/2020.

## 2020-09-07 ENCOUNTER — Other Ambulatory Visit: Payer: Self-pay

## 2020-09-07 ENCOUNTER — Ambulatory Visit (INDEPENDENT_AMBULATORY_CARE_PROVIDER_SITE_OTHER): Payer: Medicare Other

## 2020-09-07 DIAGNOSIS — R42 Dizziness and giddiness: Secondary | ICD-10-CM

## 2020-09-09 DIAGNOSIS — R42 Dizziness and giddiness: Secondary | ICD-10-CM | POA: Diagnosis not present

## 2020-09-21 ENCOUNTER — Encounter: Payer: Self-pay | Admitting: Cardiology

## 2020-09-21 ENCOUNTER — Other Ambulatory Visit: Payer: Self-pay

## 2020-09-21 ENCOUNTER — Ambulatory Visit: Payer: Medicare Other | Admitting: Cardiology

## 2020-09-21 VITALS — BP 156/92 | HR 65 | Ht 62.0 in | Wt 169.0 lb

## 2020-09-21 DIAGNOSIS — Z7189 Other specified counseling: Secondary | ICD-10-CM | POA: Diagnosis not present

## 2020-09-21 DIAGNOSIS — I10 Essential (primary) hypertension: Secondary | ICD-10-CM | POA: Diagnosis not present

## 2020-09-21 DIAGNOSIS — R42 Dizziness and giddiness: Secondary | ICD-10-CM

## 2020-09-21 MED ORDER — SPIRONOLACTONE 25 MG PO TABS
25.0000 mg | ORAL_TABLET | Freq: Every day | ORAL | 3 refills | Status: DC
Start: 2020-09-21 — End: 2020-10-07

## 2020-09-21 NOTE — Patient Instructions (Addendum)
Medication Instructions:  For the next two weeks, continue both the lisinopril-HCTZ and start the spironolactone 25 mg daily. Overlap for two weeks. Then stop lisinopril-HCTZ. Monitor blood pressure. Goal is <130/80. If your blood pressure is at goal sooner than the two weeks, you can stop lisinopril-HCTZ early.  Monitor blood pressure daily and keep a log. *If you need a refill on your cardiac medications before your next appointment, please call your pharmacy*   Lab Work: None  Testing/Procedures: None   Follow-Up: At [facility], you and your health needs are our priority.  As part of our continuing mission to provide you with exceptional heart care, we have created designated Provider Care Teams.  These Care Teams include your primary Cardiologist (physician) and Advanced Practice Providers (APPs -  Physician Assistants and Nurse Practitioners) who all work together to provide you with the care you need, when you need it.  We recommend signing up for the patient portal called "MyChart".  Sign up information is provided on this After Visit Summary.  MyChart is used to connect with patients for Virtual Visits (Telemedicine).  Patients are able to view lab/test results, encounter notes, upcoming appointments, etc.  Non-urgent messages can be sent to your provider as well.   To learn more about what you can do with MyChart, go to [email or web address].    Your next appointment:   1 month(s)  for labs (will order at visit) and BP check  The format for your next appointment:   In Person  Provider:   Jodelle Red, MD

## 2020-09-21 NOTE — Progress Notes (Signed)
Cardiology Office Note:    Date:  09/21/2020   ID:  Tunisia Landgrebe, DOB Sep 14, 1946, MRN [id number]  PCP:  Glenis Smoker, MD  Cardiologist:  Buford Dresser, MD  Referring MD: Glenis Smoker, *   CC: follow up  History of Present Illness:    Regina Marsh is a 74 y.o. female with a hx of Sjogren's who is seen for follow up today. 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  Cardiac history:  metanephrines that are within normal limits (metanephrine 48, normetanephrine 141). ER note from 03/27/20. Noted to have elevated blood pressure after she had nasal packing for a nosebleed. No associated symptoms. BP noted as 214/91 at that time. Noted to be taking pseudophedrine at home. Started on amlodipine. Changed to lisinopril-HCTZ. See extensive note from 07/21/20.  Today: Reviewed monitor results with patient and her husband today. She self stopped her medications after the monitor was done and felt much better. We reviewed her ambulatory BP monitor as well. She runs hypertensive even with sleep, and there is only a 1% dip in her systolic BP with sleep. Average BP 176/79, peak 201/98.  We discussed options and importance of BP medications. She felt better when she stopped all medications. No BP measurements at that time. We discussed the risk of stroke with uncontrolled BP, and my concern is that if her BP is elevated on her medications, it is likely even higher off of it.   She and her husband associate symptoms with "hormone dump." We reviewed her workup to date. Adrenal workup normal.  We reviewed her prior history on antihypertensives, see below.  Denies chest pain, shortness of breath at rest or with normal exertion. No PND, orthopnea, LE edema or unexpected weight gain. No syncope or palpitations.  Past Medical History:  Diagnosis Date  . Sjogren's disease ([place])      No past surgical history on file.  Current Medications: Current Outpatient Medications on File Prior to Visit  Medication Sig  . acetaminophen (TYLENOL) 325 MG tablet Take 650 mg by mouth every 6 (six) hours as needed.  [name] lisinopril-hydrochlorothiazide (ZESTORETIC) 10-12.5 MG tablet Take 1 tablet by mouth daily.  . Melatonin 500 MCG TBDP Take by mouth daily as needed.  [name] Propylene Glycol (SYSTANE BALANCE OP) Apply 1 drop to eye in the morning and at bedtime.   No current facility-administered medications on file prior to visit.     Allergies:   African mango [irvingia gabonensis] and Penicillins   Social History   Tobacco Use  . Smoking status: Never Smoker  . Smokeless tobacco: Never Used  Substance Use Topics  . Alcohol use: Not Currently  . Drug use: Not Currently    Family History: No family history of premature CAD  ROS:   Please see the history of present illness.  Additional pertinent ROS otherwise unremarkable.   EKGs/Labs/Other Studies Reviewed:    The following studies were reviewed today: Monitor 09/21/20 14 days of data recorded on Zio monitor. Patient had a min HR of 45 bpm, max HR of 184 bpm, and avg HR of 68 bpm. Predominant underlying rhythm was Sinus Rhythm. No VT, atrial fibrillation, high degree block, or pauses noted. 4 Supraventricular Tachycardia runs occurred, the run with the fastest interval lasting 6 beats with a max rate of 184 bpm, the longest lasting 11 beats with an avg rate of 109 bpm. Isolated  atrial and ventricular ectopy was rare (<1%). There were 19 triggered events. These are all sinus rhythm based, 3 with PAC and 1 with PVC.  Ambulatory 24 hour BP monitoring 09/21/20 24 hour ambulatory blood pressure monitor reviewed. Remained hypertensive throughout study. Overall mean 176/79, HR 69. Systolic dipped less than 1% with sleep. Max reading 201/98.    EKG:  EKG is personally reviewed.  The ekg ordered 06/23/20 demonstrates NSR at 83 bpm  with poor r wave progression  Recent Labs: 03/27/2020: ALT 24; BUN 11; Creatinine, Ser 0.91; Hemoglobin 13.4; Platelets 233; Potassium 3.4; Sodium 141  Recent Lipid Panel No results found for: CHOL, TRIG, HDL, CHOLHDL, VLDL, LDLCALC, LDLDIRECT  Physical Exam:    VS:  BP (!) 156/92   Pulse 65   Ht '5\' 2"'  (1.575 m)   Wt 169 lb (76.7 kg)   SpO2 98%   BMI 30.91 kg/m     Wt Readings from Last 3 Encounters:  09/21/20 169 lb (76.7 kg)  07/21/20 164 lb (74.4 kg)  06/23/20 167 lb 6.4 oz (75.9 kg)    GEN: Well nourished, well developed in no acute distress HEENT: Normal, moist mucous membranes NECK: No JVD CARDIAC: regular rhythm, normal S1 and S2, no rubs or gallops. No murmur. VASCULAR: Radial and DP pulses 2+ bilaterally. No carotid bruits RESPIRATORY:  Clear to auscultation without rales, wheezing or rhonchi  ABDOMEN: Soft, non-tender, non-distended MUSCULOSKELETAL:  Ambulates independently SKIN: Warm and dry, no edema NEUROLOGIC:  Alert and oriented x 3. No focal neuro deficits noted. PSYCHIATRIC:  Normal affect   ASSESSMENT:    1. Primary hypertension   2. Episodic lightheadedness   3. Cardiac risk counseling   4. Counseling on health promotion and disease prevention    PLAN:    Hypertension:  -elevated on lisinopril-HCTZ 20-35m daily -we discussed all of the options. Discussed pros/cons of beta blockers, calcium channel blockers, ARB, hydralazine, and clonidine. BP went up on amlodipine. Was on a beta blocker >20 years ago, felt terrible.  -we discussed spironolactone today. She is amenable. -her husband feels strongly this is a hormonal dump process. Reviewed prior workup. -given instructions on transition to spironolactone, monitoring home BP  Episodic lightheadedness: unclear etiology of this. Discussed close monitoring/log to evaluate triggers  History of Sjogren's syndrome  Cardiac risk counseling and prevention recommendations: -recommend heart  healthy/Mediterranean diet, with whole grains, fruits, vegetable, fish, lean meats, nuts, and olive oil. Limit salt. -recommend moderate walking, 3-5 times/week for 30-50 minutes each session. Aim for at least 150 minutes.week. Goal should be pace of 3 miles/hours, or walking 1.5 miles in 30 minutes -recommend avoidance of tobacco products. Avoid excess alcohol. -ASCVD risk score: The ASCVD Risk score (Mikey BussingDC Jr., et al., 2013) failed to calculate for the following reasons:   Cannot find a previous HDL lab   Cannot find a previous total cholesterol lab    Plan for follow up: 1 month, check BMET at that time.  Medication Adjustments/Labs and Tests Ordered: Current medicines are reviewed at length with the patient today.  Concerns regarding medicines are outlined above.  No orders of the defined types were placed in this encounter.  Meds ordered this encounter  Medications  . spironolactone (ALDACTONE) 25 MG tablet    Sig: Take 1 tablet (25 mg total) by mouth daily.    Dispense:  90 tablet    Refill:  3    Patient Instructions  Medication Instructions:  For the next two weeks, continue both  the lisinopril-HCTZ and start the spironolactone 25 mg daily. Overlap for two weeks. Then stop lisinopril-HCTZ. Monitor blood pressure. Goal is <130/80. If your blood pressure is at goal sooner than the two weeks, you can stop lisinopril-HCTZ early.  Monitor blood pressure daily and keep a log. *If you need a refill on your cardiac medications before your next appointment, please call your pharmacy*   Lab Work: None  Testing/Procedures: None   Follow-Up: At [facility], you and your health needs are our priority.  As part of our continuing mission to provide you with exceptional heart care, we have created designated Provider Care Teams.  These Care Teams include your primary Cardiologist (physician) and Advanced Practice Providers (APPs -  Physician Assistants and Nurse Practitioners) who  all work together to provide you with the care you need, when you need it.  We recommend signing up for the patient portal called "MyChart".  Sign up information is provided on this After Visit Summary.  MyChart is used to connect with patients for Virtual Visits (Telemedicine).  Patients are able to view lab/test results, encounter notes, upcoming appointments, etc.  Non-urgent messages can be sent to your provider as well.   To learn more about what you can do with MyChart, go to [email or web address].    Your next appointment:   1 month(s)  for labs (will order at visit) and BP check  The format for your next appointment:   In Person  Provider:   Buford Dresser, MD      Signed, Buford Dresser, MD PhD 09/21/2020  [place]

## 2020-09-24 ENCOUNTER — Telehealth: Payer: Self-pay | Admitting: Cardiology

## 2020-09-24 NOTE — Telephone Encounter (Signed)
Informed pt that labs will be ordered at [facility] on 1/19. Pt verbalized understanding.

## 2020-09-24 NOTE — Telephone Encounter (Signed)
Patient is inquiring about whether or not she needs to have lab work prior to her appointment on 10/29/19. Please advise.

## 2020-09-24 NOTE — Telephone Encounter (Signed)
Did not see where labs have been ordered but AVS indicates labs will be ordered at the next visit  Routed to MD/RN to advise if they should be done before, per patient question

## 2020-10-07 ENCOUNTER — Telehealth: Payer: Self-pay | Admitting: Cardiology

## 2020-10-07 NOTE — Telephone Encounter (Signed)
Spoke to patient she stated she cannot take Aldactone.Stated she took about 1 hour ago and she is having face numbness,cheeks are red.She has a sore on tongue.Advised stop taking.Advised she can take Benadryl 25 mg now.Dr.Christopher is out of office.I will send message to her for advice.

## 2020-10-07 NOTE — Telephone Encounter (Signed)
Pt c/o medication issue:  1. Name of Medication: spironolactone (ALDACTONE) 25 MG tablet  2. How are you currently taking this medication (dosage and times per day)? As prescribed.  3. Are you having a reaction (difficulty breathing--STAT)? Face numb, sores on tongue, lightheadedness.  4. What is your medication issue? Patient is calling in stating that she thinks she's having an allergic reaction to this medication, she says that she's been taking it for 3 weeks but this is the first time this has happened.

## 2020-10-08 ENCOUNTER — Telehealth: Payer: Self-pay

## 2020-10-08 NOTE — Telephone Encounter (Signed)
I contacted patient to discuss the message from PharmD to go to hospital. Patient states that her tingling, and rash and all are improving. She states she is not having any issues currently. I did advise her if they came back to go to the ER/Urgent Care. Patient advised that her blood pressure was 173/89 without medications, so she doesn't know what to take- she advised that she has previous meds before they were changed and she had no issues with. Lisinopril-HCTZ mixed. She was advised for now to hold off, but if the blood pressure continues to climb to go to ER/Urgent Care with/without symptoms.  Patient did verbalize this understanding.

## 2020-10-08 NOTE — Telephone Encounter (Signed)
She should either go to ED or Urgent care for evaluation.

## 2020-10-11 NOTE — Telephone Encounter (Signed)
Agree with holding spironolactone. We will discuss options at her upcoming appt.

## 2020-10-12 ENCOUNTER — Encounter: Payer: Self-pay | Admitting: Cardiology

## 2020-10-12 NOTE — Telephone Encounter (Signed)
Spoke to patient Dr.Christopher's advice given.She will continue to hold Lisinopril/HCTZ too.She will keep appointment as planned 1/19 at 3:00 pm.She will monitor B/P and bring readings to appointment.She stated B/P has been 134 to 155 / 68.

## 2020-10-23 ENCOUNTER — Ambulatory Visit: Payer: Medicare Other | Admitting: Cardiology

## 2020-10-28 ENCOUNTER — Ambulatory Visit (INDEPENDENT_AMBULATORY_CARE_PROVIDER_SITE_OTHER): Payer: Medicare Other | Admitting: Cardiology

## 2020-10-28 ENCOUNTER — Encounter: Payer: Self-pay | Admitting: Cardiology

## 2020-10-28 ENCOUNTER — Other Ambulatory Visit: Payer: Self-pay

## 2020-10-28 VITALS — BP 184/90 | HR 91 | Ht 62.0 in | Wt 169.0 lb

## 2020-10-28 DIAGNOSIS — Z7189 Other specified counseling: Secondary | ICD-10-CM

## 2020-10-28 DIAGNOSIS — Z79899 Other long term (current) drug therapy: Secondary | ICD-10-CM | POA: Diagnosis not present

## 2020-10-28 DIAGNOSIS — R42 Dizziness and giddiness: Secondary | ICD-10-CM

## 2020-10-28 DIAGNOSIS — I10 Essential (primary) hypertension: Secondary | ICD-10-CM

## 2020-10-28 MED ORDER — HYDROCHLOROTHIAZIDE 25 MG PO TABS: 25.0000 mg | ORAL_TABLET | Freq: Every day | ORAL | 11 refills | Status: DC

## 2020-10-28 NOTE — Progress Notes (Signed)
Cardiology Office Note:    Date:  10/28/2020   ID:  Regina Marsh, DOB [birth year], MRN [id number]  PCP:  Glenis Smoker, MD  Cardiologist:  Buford Dresser, MD  Referring MD: Glenis Smoker, *   CC: follow up  History of Present Illness:    Regina Marsh is a 75 y.o. female with a hx of Sjogren's who is seen for follow up today. 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  Cardiac history:  metanephrines that are within normal limits (metanephrine 48, normetanephrine 141). ER note from 03/27/20. Noted to have elevated blood pressure after she had nasal packing for a nosebleed. No associated symptoms. BP noted as 214/91 at that time. Noted to be taking pseudophedrine at home. Started on amlodipine. Changed to lisinopril-HCTZ. See extensive note from 07/21/20. Possible reaction to spironolactone (rash), stopped.  Today: Had a reaction to spironolactone, facial erythema and then diffuse rash, with facial tingling. Improving with benadryl. She does have a history of food allergies (tomatoes/potatoes). Had sores in her mouth and a cough. At first thought it was a food reaction, but then associated it back to her medication changes. Now back to having intermittent lightheadedness.   She is now back on lisinopril-HCTZ. She is taking 1/2 tab of this currently and feels that her blood pressure is better on less medication. Brings log of BP measurements. Range 146/79, HR 55-167/70 HR 64.  We discussed today goal for BP based on guidelines. Discussed options for management. After shared decision making, will try to optimize on one medication at a time to avoid potential interactions, see below.  Past Medical History:  Diagnosis Date  . Sjogren's disease ([place])     No past surgical history on file.  Current Medications: Current Outpatient Medications on File Prior to Visit   Medication Sig  . acetaminophen (TYLENOL) 325 MG tablet Take 650 mg by mouth every 6 (six) hours as needed.  [name] lisinopril-hydrochlorothiazide (ZESTORETIC) 10-12.5 MG tablet Take 1 tablet by mouth daily.  . Melatonin 500 MCG TBDP Take by mouth daily as needed.  [name] Propylene Glycol (SYSTANE BALANCE OP) Apply 1 drop to eye in the morning and at bedtime.   No current facility-administered medications on file prior to visit.     Allergies:   African mango [irvingia gabonensis] and Penicillins   Social History   Tobacco Use  . Smoking status: Never Smoker  . Smokeless tobacco: Never Used  Substance Use Topics  . Alcohol use: Not Currently  . Drug use: Not Currently    Family History: No family history of premature CAD  ROS:   Please see the history of present illness.  Additional pertinent ROS otherwise unremarkable.   EKGs/Labs/Other Studies Reviewed:    The following studies were reviewed today: Monitor 09/21/20 14 days of data recorded on Zio monitor. Patient had a min HR of 45 bpm, max HR of 184 bpm, and avg HR of 68 bpm. Predominant underlying rhythm was Sinus Rhythm. No VT, atrial fibrillation, high degree block, or pauses noted. 4 Supraventricular Tachycardia runs occurred, the run with the fastest interval lasting 6 beats with a max rate of 184 bpm, the longest lasting 11 beats with an avg rate of 109 bpm. Isolated atrial and ventricular ectopy was rare (<1%). There were 19 triggered events. These are all sinus rhythm based, 3 with PAC and 1 with PVC.  Ambulatory 24 hour  BP monitoring 09/21/20 24 hour ambulatory blood pressure monitor reviewed. Remained hypertensive throughout study. Overall mean 176/79, HR 69. Systolic dipped less than 1% with sleep. Max reading 201/98.    EKG:  EKG is personally reviewed.  The ekg ordered 06/23/20 demonstrates NSR at 83 bpm with poor r wave progression  Recent Labs: 03/27/2020: ALT 24; BUN 11; Creatinine, Ser 0.91; Hemoglobin 13.4;  Platelets 233; Potassium 3.4; Sodium 141  Recent Lipid Panel No results found for: CHOL, TRIG, HDL, CHOLHDL, VLDL, LDLCALC, LDLDIRECT  Physical Exam:    VS:  BP (!) 184/90 (BP Location: Left Arm, Patient Position: Sitting)   Pulse 91   Ht '5\' 2"'  (1.575 m)   Wt 169 lb (76.7 kg)   SpO2 99%   BMI 30.91 kg/m     Wt Readings from Last 3 Encounters:  10/28/20 169 lb (76.7 kg)  09/21/20 169 lb (76.7 kg)  07/21/20 164 lb (74.4 kg)    GEN: Well nourished, well developed in no acute distress HEENT: Normal, moist mucous membranes NECK: No JVD CARDIAC: regular rhythm, normal S1 and S2, no rubs or gallops. No murmur. VASCULAR: Radial and DP pulses 2+ bilaterally. No carotid bruits RESPIRATORY:  Clear to auscultation without rales, wheezing or rhonchi  ABDOMEN: Soft, non-tender, non-distended MUSCULOSKELETAL:  Ambulates independently SKIN: Warm and dry, no edema NEUROLOGIC:  Alert and oriented x 3. No focal neuro deficits noted. PSYCHIATRIC:  Normal affect   ASSESSMENT:    1. Primary hypertension   2. Medication management   3. Episodic lightheadedness   4. Cardiac risk counseling    PLAN:    Hypertension:  -continues to be above goal -had rash on spironolactone, stopped -has felt terrible on beta blockers in the past -reported that BP went up on amlodipine -after shared decision making, will start with simplification of regimen. She is currently taking 1/2 tab of lisinopril 10 mg-HCTZ 12.5 mg. We will stop this and work to titrate the HCTZ alone. Will start at 12.5 mg daily for a week, then increase to 25 mg daily after.  -will have her come back for BMET given increase of HCTZ -will follow closely  Episodic lightheadedness: unclear etiology of this. Discussed close monitoring/log to evaluate triggers  History of Sjogren's syndrome  Cardiac risk counseling and prevention recommendations: -recommend heart healthy/Mediterranean diet, with whole grains, fruits, vegetable, fish,  lean meats, nuts, and olive oil. Limit salt. -recommend moderate walking, 3-5 times/week for 30-50 minutes each session. Aim for at least 150 minutes.week. Goal should be pace of 3 miles/hours, or walking 1.5 miles in 30 minutes -recommend avoidance of tobacco products. Avoid excess alcohol. -ASCVD risk score: The ASCVD Risk score Mikey Bussing DC Jr., et al., 2013) failed to calculate for the following reasons:   Cannot find a previous HDL lab   Cannot find a previous total cholesterol lab    Plan for follow up: 3-4 weeks  Medication Adjustments/Labs and Tests Ordered: Current medicines are reviewed at length with the patient today.  Concerns regarding medicines are outlined above.  Orders Placed This Encounter  Procedures  . Basic metabolic panel   Meds ordered this encounter  Medications  . hydrochlorothiazide (HYDRODIURIL) 25 MG tablet    Sig: Take 1 tablet (25 mg total) by mouth daily.    Dispense:  30 tablet    Refill:  11    Patient Instructions  Medication Instructions:  Stop the combination lisinopril-HCTZ pill. Start taking HCTZ 12.5 mg daily for one week, and if you tolerate  this well, then go up to 25 mg daily pill.   *If you need a refill on your cardiac medications before your next appointment, please call your pharmacy*   Lab Work: None  Testing/Procedures: None   Follow-Up: At [facility], you and your health needs are our priority.  As part of our continuing mission to provide you with exceptional heart care, we have created designated Provider Care Teams.  These Care Teams include your primary Cardiologist (physician) and Advanced Practice Providers (APPs -  Physician Assistants and Nurse Practitioners) who all work together to provide you with the care you need, when you need it.  We recommend signing up for the patient portal called "MyChart".  Sign up information is provided on this After Visit Summary.  MyChart is used to connect with patients for Virtual  Visits (Telemedicine).  Patients are able to view lab/test results, encounter notes, upcoming appointments, etc.  Non-urgent messages can be sent to your provider as well.   To learn more about what you can do with MyChart, go to [email or web address].    Your next appointment:   3-4 week(s)  The format for your next appointment:   In Person  Provider:   Buford Dresser, MD       Signed, Buford Dresser, MD PhD 10/28/2020  [place]

## 2020-10-28 NOTE — Patient Instructions (Addendum)
Medication Instructions:  Stop the combination lisinopril-HCTZ pill. Start taking HCTZ 12.5 mg daily for one week, and if you tolerate this well, then go up to 25 mg daily pill.   *If you need a refill on your cardiac medications before your next appointment, please call your pharmacy*   Lab Work: None  Testing/Procedures: None   Follow-Up: At [facility], you and your health needs are our priority.  As part of our continuing mission to provide you with exceptional heart care, we have created designated Provider Care Teams.  These Care Teams include your primary Cardiologist (physician) and Advanced Practice Providers (APPs -  Physician Assistants and Nurse Practitioners) who all work together to provide you with the care you need, when you need it.  We recommend signing up for the patient portal called "MyChart".  Sign up information is provided on this After Visit Summary.  MyChart is used to connect with patients for Virtual Visits (Telemedicine).  Patients are able to view lab/test results, encounter notes, upcoming appointments, etc.  Non-urgent messages can be sent to your provider as well.   To learn more about what you can do with MyChart, go to [email or web address].    Your next appointment:   3-4 week(s)  The format for your next appointment:   In Person  Provider:   Jodelle Red, MD

## 2020-10-29 LAB — BASIC METABOLIC PANEL WITH GFR
BUN/Creatinine Ratio: 18 (ref 12–28)
BUN: 20 mg/dL (ref 8–27)
CO2: 22 mmol/L (ref 20–29)
Calcium: 9.5 mg/dL (ref 8.7–10.3)
Chloride: 103 mmol/L (ref 96–106)
Creatinine, Ser: 1.11 mg/dL — ABNORMAL HIGH (ref 0.57–1.00)
GFR calc Af Amer: 56 mL/min/{1.73_m2} — ABNORMAL LOW
GFR calc non Af Amer: 49 mL/min/{1.73_m2} — ABNORMAL LOW
Glucose: 90 mg/dL (ref 65–99)
Potassium: 4.3 mmol/L (ref 3.5–5.2)
Sodium: 141 mmol/L (ref 134–144)

## 2020-11-19 DIAGNOSIS — R232 Flushing: Secondary | ICD-10-CM | POA: Diagnosis not present

## 2020-11-19 DIAGNOSIS — L509 Urticaria, unspecified: Secondary | ICD-10-CM | POA: Diagnosis not present

## 2020-11-25 ENCOUNTER — Encounter: Payer: Self-pay | Admitting: Cardiology

## 2020-11-27 ENCOUNTER — Ambulatory Visit: Payer: Medicare Other | Admitting: "Endocrinology

## 2020-11-27 ENCOUNTER — Encounter: Payer: Self-pay | Admitting: "Endocrinology

## 2020-11-27 ENCOUNTER — Other Ambulatory Visit: Payer: Self-pay

## 2020-11-27 VITALS — BP 172/106 | HR 76 | Ht 62.0 in | Wt 166.2 lb

## 2020-11-27 DIAGNOSIS — E349 Endocrine disorder, unspecified: Secondary | ICD-10-CM | POA: Diagnosis not present

## 2020-11-27 NOTE — Progress Notes (Signed)
Endocrinology Consult Note                                            11/27/2020, 12:55 PM   Subjective:    Patient ID: Regina Marsh, female    DOB: 24-Dec-1945, PCP Shon Hale, MD   Past Medical History:  Diagnosis Date  . Hypertension   . Low back pain   . Sjogren's disease [facility])    Past Surgical History:  Procedure Laterality Date  . BREAST LUMPECTOMY    . CESAREAN SECTION    . LAPAROSCOPY    . TONSILLECTOMY AND ADENOIDECTOMY     Social History   Socioeconomic History  . Marital status: Married    Spouse name: Not on file  . Number of children: Not on file  . Years of education: Not on file  . Highest education level: Not on file  Occupational History  . Not on file  Tobacco Use  . Smoking status: Never Smoker  . Smokeless tobacco: Never Used  Substance and Sexual Activity  . Alcohol use: Not Currently  . Drug use: Not Currently  . Sexual activity: Not on file  Other Topics Concern  . Not on file  Social History Narrative  . Not on file   Social Determinants of Health   Financial Resource Strain: Not on file  Food Insecurity: Not on file  Transportation Needs: Not on file  Physical Activity: Not on file  Stress: Not on file  Social Connections: Not on file   Family History  Problem Relation Age of Onset  . Hypertension Mother   . Stroke Father   . Cancer Father    Outpatient Encounter Medications as of 11/27/2020  Medication Sig  . diphenhydrAMINE HCl (BENADRYL ALLERGY PO) Take 0.5-1 tablets by mouth daily as needed.  . hydrochlorothiazide (HYDRODIURIL) 25 MG tablet Take 25 mg by mouth daily.  [name] acetaminophen (TYLENOL) 325 MG tablet Take 650 mg by mouth every 6 (six) hours as needed.  . Melatonin 500 MCG TBDP Take by mouth daily as needed.  [name] Propylene Glycol (SYSTANE BALANCE OP) Apply 1 drop to eye in the morning and at bedtime.  . [DISCONTINUED] hydrochlorothiazide (HYDRODIURIL) 25 MG tablet Take 1 tablet (25 mg total) by  mouth daily. (Patient taking differently: Take 12.5 mg by mouth daily.)   No facility-administered encounter medications on file as of 11/27/2020.   ALLERGIES: Allergies  Allergen Reactions  . African Mango [Irvingia Gabonensis]   . Penicillins   . Spironolactone Rash    VACCINATION STATUS:  There is no immunization history on file for this patient.  HPI Regina Marsh is 75 y.o. female who presents today with a medical history as above. she is being seen in consultation for work-up to rule out Cushing syndrome/disease due to recent occurrence of facial flushing.   -His consult is requested by Shon Hale, MD.  She denies any prior history of endocrine dysfunction.  She does have hypertension  since June 2021.  She mentions the fact that it is difficult to control however she is taking only HCTZ 12.5 mg daily.  She does measure it at home regularly, the lowest measurement has been 148 over 80s.  She does have several clinic visits where a significant above target. -At one of her lab works she did have normal plasma metanephrines. -She  denies any prior history of thyrotoxicosis, no exposure to heavy dose steroids on regular basis.  She denies any family history of endocrinopathies.  She has medical diagnosis of Sjogren's syndrome as well as lactose intolerance.  She observed that her allergy used to be only for milk and ice cream, however recently anything with dairy.  She is scheduled to see an allergist in the near future.  She showed a picture of her face at various times with flushing.  She also reports loose stools to diarrhea on and off.  She also wheezes at times.  She reports weight gain, unquantified amount.  Review of her records show that her weight has been stable since June 2021. -Review of her medical records did not include any abdominal imaging. -She denies any diabetes nor antidiabetic medications.  She did not tolerate spironolactone.  Only medication she is taking  currently are melatonin, Propine glycol, Benadryl as needed, Tylenol as needed.  Review of Systems  Constitutional: + Minimally fluctuating body weight, , no fatigue, no subjective hyperthermia, no subjective hypothermia Eyes: no blurry vision, no xerophthalmia, ENT: no sore throat, no nodules palpated in throat, no dysphagia/odynophagia, no hoarseness,  + xerostomia Cardiovascular: no Chest Pain, no Shortness of Breath, no palpitations, no leg swelling Respiratory: no cough, no shortness of breath Gastrointestinal: no Nausea/Vomiting/Diarhhea Musculoskeletal: no muscle/joint aches Skin: no rashes, + flushing skin intermittently, seen on her pictures Neurological: no tremors, no numbness, no tingling, no dizziness Psychiatric: no depression, no anxiety  Objective:    Vitals with BMI 11/27/2020 10/28/2020 09/21/2020  Height 5\' 2"  5\' 2"  5\' 2"   Weight 166 lbs 3 oz 169 lbs 169 lbs  BMI 30.39 30.9 30.9  Systolic 172 184  Diastolic 106 90 92  Pulse 76 91 65    BP (!) 172/106   Pulse 76   Ht 5\' 2"  (1.575 m)   Wt 166 lb 3.2 oz (75.4 kg)   BMI 30.40 kg/m   Wt Readings from Last 3 Encounters:  11/27/20 166 lb 3.2 oz (75.4 kg)  10/28/20 169 lb (76.7 kg)  09/21/20 169 lb (76.7 kg)    Physical Exam  Constitutional:  Body mass index is 30.4 kg/m.,  + Moderately anxious state of mind,.  Eyes: PERRLA, EOMI, no exophthalmos ENT: moist mucous membranes, + gross thyromegaly, no gross cervical lymphadenopathy Cardiovascular: normal precordial activity, Regular Rate and Rhythm, no Murmur/Rubs/Gallops Respiratory:  adequate breathing efforts, no gross chest deformity, Clear to auscultation bilaterally Gastrointestinal: abdomen soft, Non -tender, No distension, Bowel Sounds present, no gross organomegaly Musculoskeletal: no gross deformities, strength intact in all four extremities Skin: moist, warm, no rashes Neurological: no tremor with outstretched hands, Deep tendon reflexes normal in  bilateral lower extremities.  CMP ( most recent) CMP     Component Value Date/Time   NA 141 10/28/2020 1610   K 4.3 10/28/2020 1610   CL 103 10/28/2020 1610   CO2 22 10/28/2020 1610   GLUCOSE 90 10/28/2020 1610   GLUCOSE 118 (H) 03/27/2020 1422   BUN 20 10/28/2020 1610   CREATININE 1.11 (H) 10/28/2020 1610   CALCIUM 9.5 10/28/2020 1610   PROT 7.6 03/27/2020 1422   ALBUMIN 4.3 03/27/2020 1422   AST 24 03/27/2020 1422   ALT 24 03/27/2020 1422   ALKPHOS 65 03/27/2020 1422   BILITOT 1.8 (H) 03/27/2020 1422   GFRNONAA 49 (L) 10/28/2020 1610   GFRAA 56 (L) 10/28/2020 1610     Assessment & Plan:   1. Endocrine disorder,  unspecified  - Anaiza Behrens  is being seen at a kind request of Shon Hale, MD. - I have reviewed her available endocrine records and clinically evaluated the patient.  Her presentation symptoms are nonspecific for any particular endocrinopathy. - Based on these reviews  there is not sufficient information to proceed with definitive treatment plan.  - she will need a repeat,  more complete endocrine evaluation to rule out, and rare endocrinopathies including hypothyroidism, pheochromocytoma, carcinoid syndrome, Cushing syndrome.   She will need 24-hour urine sample for 5 HIAA, metanephrines, catecholamines, as well as cortisol. She will have blood sample this morning for thyroid function test, 25 hydroxy vitamin D.  -she will return in 2 week to review her repeat labs.   If her  labs are suggestive of any hormone excess, she will be considered for imaging studies accordingly.  -I had a long discussion about the need to control her blood pressure better.  She reluctantly accepted to increase her HCTZ to 25 mg p.o. daily.  It is likely that she will need more agents to treat her blood pressure, spironolactone is mentioned as low risk allergy.  However, she has several options to consider on subsequent visits.   - I did not initiate any new  prescriptions today. - she is advised to maintain close follow up with Shon Hale, MD for primary care needs.  She is advised to maintain close follow-up with her cardiologist, PCP, as well as keep her appointment with her allergist.    - Time spent with the patient: 60 minutes, of which >50% was spent in discussing the need to do some work-up in search of diagnosis.  She has established diagnosis of hypertension which needs better treatment, and the rest in obtaining information about her symptoms, reviewing her previous labs/studies ( including abstractions from other facilities),  evaluations, and treatments,  and developing a plan to confirm diagnosis; and documenting her care.  Regina Marsh participated in the discussions, expressed understanding, and voiced agreement with the above plans.  All questions were answered to her satisfaction. she is encouraged to contact clinic should she have any questions or concerns prior to her return visit.   Follow up plan: Return in about 2 weeks (around 12/11/2020), or and 24 hour urine tests as ordered, for Labs Today- Non-Fasting Ok.   Marquis Lunch, MD [facility] [address] [place], [place] [address] Phone: [phone]  Fax: [phone]     11/27/2020, 12:55 PM  This note was partially dictated with voice recognition software. Similar sounding words can be transcribed inadequately or may not  be corrected upon review.

## 2020-11-29 DIAGNOSIS — E349 Endocrine disorder, unspecified: Secondary | ICD-10-CM | POA: Diagnosis not present

## 2020-11-30 DIAGNOSIS — E349 Endocrine disorder, unspecified: Secondary | ICD-10-CM | POA: Diagnosis not present

## 2020-12-01 DIAGNOSIS — E349 Endocrine disorder, unspecified: Secondary | ICD-10-CM | POA: Diagnosis not present

## 2020-12-04 ENCOUNTER — Encounter: Payer: Self-pay | Admitting: Cardiology

## 2020-12-04 ENCOUNTER — Ambulatory Visit: Payer: Medicare Other | Admitting: Cardiology

## 2020-12-04 ENCOUNTER — Other Ambulatory Visit: Payer: Self-pay

## 2020-12-04 VITALS — BP 180/100 | HR 82 | Ht 62.0 in | Wt 167.0 lb

## 2020-12-04 DIAGNOSIS — Z712 Person consulting for explanation of examination or test findings: Secondary | ICD-10-CM | POA: Diagnosis not present

## 2020-12-04 DIAGNOSIS — I10 Essential (primary) hypertension: Secondary | ICD-10-CM | POA: Diagnosis not present

## 2020-12-04 DIAGNOSIS — M3500 Sicca syndrome, unspecified: Secondary | ICD-10-CM

## 2020-12-04 DIAGNOSIS — R42 Dizziness and giddiness: Secondary | ICD-10-CM

## 2020-12-04 NOTE — Progress Notes (Signed)
Cardiology Office Note:    Date:  12/04/2020   ID:  Regina Marsh [birth year], MRN [id number]  PCP:  Glenis Smoker, MD  Cardiologist:  Buford Dresser, MD  Referring MD: Glenis Smoker, *   CC: follow up  History of Present Illness:    Regina Marsh is a 75 y.o. female with a hx of Sjogren's, hypertension who is seen for follow up today. 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  Cardiac history:  metanephrines that are within normal limits (metanephrine 48, normetanephrine 141). ER note from 03/27/20. Noted to have elevated blood pressure after she had nasal packing for a nosebleed. No associated symptoms. BP noted as 214/91 at that time. Noted to be taking pseudophedrine at home. Started on amlodipine. Changed to lisinopril-HCTZ. See extensive note from 07/21/20. Possible reaction to spironolactone (rash), stopped.  Today: Feeling better, less lightheadedness but has not been able to eat much due to rash/face swelling concerning for allergic reaction. Tried claritin, zyrtec, didn't tolerate, so she takes low dow benadryl a few times/day. Trying to sort out the triggers for this.  Brings a BP log from home. Range has been 152/75 to 163/81.  Takes at least 1/2 tab of HCTZ daily, tries to take full pill if her stomach will tolerate.  Started vitamin D, had leg pain, now stopped with resolution of her leg pain.  Has had recent blood work done, reviewed. Cortisol is pending, metanephrines normal. Has upcoming allergy/immunology and then follow up with Dr. Lindell Noe in a month.  Denies chest pain, shortness of breath at rest or with normal exertion. No PND, orthopnea, LE edema or unexpected weight gain. No syncope or palpitations.  Past Medical History:  Diagnosis Date  . Hypertension   . Low back pain   . Sjogren's disease [facility])     Past Surgical History:   Procedure Laterality Date  . BREAST LUMPECTOMY    . CESAREAN SECTION    . LAPAROSCOPY    . TONSILLECTOMY AND ADENOIDECTOMY      Current Medications: Current Outpatient Medications on File Prior to Visit  Medication Sig  . acetaminophen (TYLENOL) 325 MG tablet Take 650 mg by mouth every 6 (six) hours as needed.  . diphenhydrAMINE HCl (BENADRYL ALLERGY PO) Take 0.5-1 tablets by mouth daily as needed.  . hydrochlorothiazide (HYDRODIURIL) 25 MG tablet Take 25 mg by mouth daily.  . Melatonin 500 MCG TBDP Take by mouth daily as needed.  [name] Propylene Glycol (SYSTANE BALANCE OP) Apply 1 drop to eye in the morning and at bedtime.   No current facility-administered medications on file prior to visit.     Allergies:   African mango [irvingia gabonensis], Penicillins, and Spironolactone   Social History   Tobacco Use  . Smoking status: Never Smoker  . Smokeless tobacco: Never Used  Substance Use Topics  . Alcohol use: Not Currently  . Drug use: Not Currently    Family History: No family history of premature CAD  ROS:   Please see the history of present illness.  Additional pertinent ROS otherwise unremarkable.   EKGs/Labs/Other Studies Reviewed:    The following studies were reviewed today: Monitor 09/21/20 14 days of data recorded on Zio monitor. Patient had a min HR of 45 bpm, max HR of 184 bpm, and avg HR of 68 bpm. Predominant underlying rhythm was Sinus Rhythm. No VT, atrial fibrillation, high degree block,  or pauses noted. 4 Supraventricular Tachycardia runs occurred, the run with the fastest interval lasting 6 beats with a max rate of 184 bpm, the longest lasting 11 beats with an avg rate of 109 bpm. Isolated atrial and ventricular ectopy was rare (<1%). There were 19 triggered events. These are all sinus rhythm based, 3 with PAC and 1 with PVC.  Ambulatory 24 hour BP monitoring 09/21/20 24 hour ambulatory blood pressure monitor reviewed. Remained hypertensive throughout  study. Overall mean 176/79, HR 69. Systolic dipped less than 1% with sleep. Max reading 201/98.    EKG:  EKG is personally reviewed.  The ekg ordered 06/23/20 demonstrates NSR at 83 bpm with poor r wave progression  Recent Labs: 03/27/2020: ALT 24; Hemoglobin 13.4; Platelets 233 10/28/2020: BUN 20; Creatinine, Ser 1.11; Potassium 4.3; Sodium 141 11/30/2020: TSH 2.380  Recent Lipid Panel No results found for: CHOL, TRIG, HDL, CHOLHDL, VLDL, LDLCALC, LDLDIRECT  Physical Exam:    VS:  BP (!) 180/100   Pulse 82   Ht $R'5\' 2"'fa$  (1.575 m)   Wt 167 lb (75.8 kg)   SpO2 99%   BMI 30.54 kg/m     Wt Readings from Last 3 Encounters:  12/04/20 167 lb (75.8 kg)  11/27/20 166 lb 3.2 oz (75.4 kg)  10/28/20 169 lb (76.7 kg)    GEN: Well nourished, well developed in no acute distress HEENT: Normal, moist mucous membranes NECK: No JVD CARDIAC: regular rhythm, normal S1 and S2, no rubs or gallops. No murmur. VASCULAR: Radial and DP pulses 2+ bilaterally. No carotid bruits RESPIRATORY:  Clear to auscultation without rales, wheezing or rhonchi  ABDOMEN: Soft, non-tender, non-distended MUSCULOSKELETAL:  Ambulates independently SKIN: Warm and dry, no edema NEUROLOGIC:  Alert and oriented x 3. No focal neuro deficits noted. PSYCHIATRIC:  Normal affect   ASSESSMENT:    1. Primary hypertension   2. Episodic lightheadedness   3. Sjogren's syndrome, with unspecified organ involvement ([place])   4. Encounter to discuss test results    PLAN:    Hypertension:  -continues to be above goal -had rash on spironolactone, stopped -has felt terrible on beta blockers in the past -reported that BP went up on amlodipine -we simplified last visit from lisinopril-HCTZ to just HCTZ. For the first time in a while, she feels like she has more energy/less lightheadedness. However, BP remains elevate. Numbers at home better than in the office -with workup ongoing for cause of her allergic symptoms, will hold on making  changes. She is running 841-660 systolic at home. While this is not ideal long term, we can tolerate short term to not muddy the waters for her allergy eval -counseled that if BP starts to be >630 systolic at home, we will need to adjust the medication regimen -will follow closely -has upcoming visit, likely with bloodwork. Will monitor renal function if done with those labs, otherwise may have to have her return for labs. Will follow this -reviewed recent lab test results, thyroid studies normal, metanephrines normal  Episodic lightheadedness: improved on HCTZ alone  History of Sjogren's syndrome, now with unclear allergy/immune reaction -will hold on altering medications at this time while evaluation underway  Cardiac risk counseling and prevention recommendations: -recommend heart healthy/Mediterranean diet, with whole grains, fruits, vegetable, fish, lean meats, nuts, and olive oil. Limit salt. -recommend moderate walking, 3-5 times/week for 30-50 minutes each session. Aim for at least 150 minutes.week. Goal should be pace of 3 miles/hours, or walking 1.5 miles in 30 minutes -recommend avoidance  of tobacco products. Avoid excess alcohol.  Plan for follow up: with allergy appt in 1 week and Dr. Lindell Noe in 1 mos, I will see her back in 2 mos or sooner if BP rises  Medication Adjustments/Labs and Tests Ordered: Current medicines are reviewed at length with the patient today.  Concerns regarding medicines are outlined above.  No orders of the defined types were placed in this encounter.  No orders of the defined types were placed in this encounter.   Patient Instructions  Medication Instructions:  Your Physician recommend you continue on your current medication as directed.    *If you need a refill on your cardiac medications before your next appointment, please call your pharmacy*   Lab Work: None   Testing/Procedures: none   Follow-Up: At [facility], you and your  health needs are our priority.  As part of our continuing mission to provide you with exceptional heart care, we have created designated Provider Care Teams.  These Care Teams include your primary Cardiologist (physician) and Advanced Practice Providers (APPs -  Physician Assistants and Nurse Practitioners) who all work together to provide you with the care you need, when you need it.  We recommend signing up for the patient portal called "MyChart".  Sign up information is provided on this After Visit Summary.  MyChart is used to connect with patients for Virtual Visits (Telemedicine).  Patients are able to view lab/test results, encounter notes, upcoming appointments, etc.  Non-urgent messages can be sent to your provider as well.   To learn more about what you can do with MyChart, go to [email or web address].    Your next appointment:   2 month(s)  The format for your next appointment:   In Person  Provider:   Buford Dresser, MD     Signed, Buford Dresser, MD PhD 12/04/2020  [place]

## 2020-12-04 NOTE — Patient Instructions (Signed)
Medication Instructions:  Your Physician recommend you continue on your current medication as directed.    *If you need a refill on your cardiac medications before your next appointment, please call your pharmacy*   Lab Work: None   Testing/Procedures: none   Follow-Up: At [facility], you and your health needs are our priority.  As part of our continuing mission to provide you with exceptional heart care, we have created designated Provider Care Teams.  These Care Teams include your primary Cardiologist (physician) and Advanced Practice Providers (APPs -  Physician Assistants and Nurse Practitioners) who all work together to provide you with the care you need, when you need it.  We recommend signing up for the patient portal called "MyChart".  Sign up information is provided on this After Visit Summary.  MyChart is used to connect with patients for Virtual Visits (Telemedicine).  Patients are able to view lab/test results, encounter notes, upcoming appointments, etc.  Non-urgent messages can be sent to your provider as well.   To learn more about what you can do with MyChart, go to [email or web address].    Your next appointment:   2 month(s)  The format for your next appointment:   In Person  Provider:   Jodelle Red, MD

## 2020-12-06 LAB — METANEPHRINES, URINE, 24 HOUR
Metaneph Total, Ur: 57 ug/L
Metanephrines, 24H Ur: 114 ug/24 hr (ref 36–209)
Normetanephrine, 24H Ur: 370 ug/24 hr (ref 131–612)
Normetanephrine, Ur: 185 ug/L

## 2020-12-06 LAB — CORTISOL, URINE, FREE
Cortisol (Ur), Free: 12 ug/24 hr (ref 6–42)
Cortisol,F,ug/L,U: 6 ug/L

## 2020-12-09 LAB — 5 HIAA W/CREATININE, 24 HR
5-HIAA, Ur: 1.7 mg/L
5-HIAA,Quant.,24 Hr Urine: 3.4 mg/24 hr (ref 0.0–14.9)
Creatinine, 24H Ur: 856 mg/24 hr (ref 800–1800)
Creatinine, Urine: 42.8 mg/dL

## 2020-12-09 LAB — VITAMIN D 25 HYDROXY (VIT D DEFICIENCY, FRACTURES): Vit D, 25-Hydroxy: 19.4 ng/mL — ABNORMAL LOW (ref 30.0–100.0)

## 2020-12-09 LAB — TSH: TSH: 2.38 u[IU]/mL (ref 0.450–4.500)

## 2020-12-09 LAB — T4, FREE: Free T4: 1.27 ng/dL (ref 0.82–1.77)

## 2020-12-09 LAB — THYROID PEROXIDASE ANTIBODY: Thyroperoxidase Ab SerPl-aCnc: 9 IU/mL (ref 0–34)

## 2020-12-09 LAB — T3, FREE: T3, Free: 3 pg/mL (ref 2.0–4.4)

## 2020-12-09 LAB — THYROGLOBULIN ANTIBODY: Thyroglobulin Antibody: <1 IU/mL (ref 0.0–0.9)

## 2020-12-09 NOTE — Progress Notes (Signed)
New Patient Note  RE: Regina Marsh MRN: [id number] DOB: May 13, 1946 Date of Office Visit: 12/10/2020  Referring provider: Shon Hale, * Primary care provider: Shon Hale, MD  Chief Complaint: Allergy Testing (June 25th had a sneezing fit, went into shock at urgent care and when she came too bp was 239/50, went to ed. She has been on several different bp meds since then. She had a reaction in December before christmas, whole body redness and spots. Due to a new bp med (spironolatone) . She keeps having major reactions to different meds and now reactions to tomatoes, bell peppers, possibly white potatoes. Not wearing any jewerly, not using hand sanitizer due to a reaction and no make up)  History of Present Illness: I had the pleasure of seeing Regina Marsh for initial evaluation at the [facility] on 12/10/2020. She is a 75 y.o. female, who is referred here by Shon Hale, MD for the evaluation of rash/allergic reactions.   Patient was outdoors in June 2021 and started sneezing which caused epistaxis. She went to the PCP and her blood pressure was very elevated and sent to the ER at that time.  Since then she has been following with endocrinology and cardiology for resistant hypertension.   Patient was started on Sprinolactone in December 2021 and about a few weeks afterwards she broke out in facial rash/edema which traveled down to her body and difficulty breathing with mouth sores.  She was started on benadryl which helped and stopped taking the Spironolactone. About 1 week afterwards she had similar reaction as above but she was off spironolactone.   Since the above episodes she had about at least 15 total episodes of these rashes/edema. Sometimes she has an episode a few times a week and sometimes none.   The rash mainly occurs on her face, chest and arm. Describes them as burning, erythematous, raised and flat. Individual rashes  lasts about less than 1 day. No ecchymosis upon resolution. Associated symptoms include: facial swelling, feels hot, diarrhea. Suspected triggers are unknown. Denies any fevers, chills, foods, personal care products or recent infections. Patient started HCTZ on January 19th.  She has tried the following therapies: benadryl with good benefit.  Zyrtec caused dizziness and Claritin caused dryness. She also has Sjogren's.   Systemic steroids no.  Previous history of rash/hives: no. Patient is up to date with the following cancer screening tests: pap smears, mammogram, colonoscopy.  Patient may have had COVID-19 in early January 2020 but was never formally diagnosed or tested - patient's coworker was at a convention in [place] and came home with URI and GI symptoms. Patient only developed some coughing.  Reviewed images on the phone - facial flushing noted.   03/27/2020 ER visit: "75 year old female presents with hypertension. Sent over by her PCPs office. Yesterday she went to urgent care because she was having a right-sided nosebleed. They placed a Rhino Rocket. At that time her husband states her blood pressure was measuring around 220. She has never had hypertension before and in April and March her blood pressure was around 130-140 at her PCPs office. Went to her PCP today to have the packing removed and her blood pressure was very elevated so they sent her here because they were worried she was started bleeding again. The patient has noticed intermittent minimal bleeding but none now. She denies headache, vision changes, chest pain, shortness of breath."  2/245/22 cardiology OV: "Hypertension:  -continues to be above  goal -had rash on spironolactone, stopped -has felt terrible on beta blockers in the past -reported that BP went up on amlodipine -we simplified last visit from lisinopril-HCTZ to just HCTZ. For the first time in a while, she feels like she has more energy/less  lightheadedness. However, BP remains elevate. Numbers at home better than in the office -with workup ongoing for cause of her allergic symptoms, will hold on making changes. She is running 150-160 systolic at home. While this is not ideal long term, we can tolerate short term to not muddy the waters for her allergy eval -counseled that if BP starts to be >180 systolic at home, we will need to adjust the medication regimen -will follow closely -has upcoming visit, likely with bloodwork. Will monitor renal function if done with those labs, otherwise may have to have her return for labs. Will follow this -reviewed recent lab test results, thyroid studies normal, metanephrines normal  Episodic lightheadedness: improved on HCTZ alone  History of Sjogren's syndrome, now with unclear allergy/immune reaction -will hold on altering medications at this time while evaluation underway  Cardiac risk counseling and prevention recommendations: -recommend heart healthy/Mediterranean diet, with whole grains, fruits, vegetable, fish, lean meats, nuts, and olive oil. Limit salt. -recommend moderate walking, 3-5 times/week for 30-50 minutes each session. Aim for at least 150 minutes.week. Goal should be pace of 3 miles/hours, or walking 1.5 miles in 30 minutes -recommend avoidance of tobacco products. Avoid excess alcohol.  Plan for follow up: with allergy appt in 1 week and Dr. Chanetta Marshall in 1 mos, I will see her back in 2 mos or sooner if BP rises"   11/27/2020 endo OV: "HPI Regina Marsh is 75 y.o. female who presents today with a medical history as above. she is being seen in consultation for work-up to rule out Cushing syndrome/disease due to recent occurrence of facial flushing.   -His consult is requested by Shon Hale, MD.  She denies any prior history of endocrine dysfunction.  She does have hypertension  since June 2021.  She mentions the fact that it is difficult to control however she  is taking only HCTZ 12.5 mg daily.  She does measure it at home regularly, the lowest measurement has been 148 over 80s.  She does have several clinic visits where a significant above target. -At one of her lab works she did have normal plasma metanephrines. -She denies any prior history of thyrotoxicosis, no exposure to heavy dose steroids on regular basis.  She denies any family history of endocrinopathies.  She has medical diagnosis of Sjogren's syndrome as well as lactose intolerance.  She observed that her allergy used to be only for milk and ice cream, however recently anything with dairy.  She is scheduled to see an allergist in the near future.  She showed a picture of her face at various times with flushing.  She also reports loose stools to diarrhea on and off.  She also wheezes at times.  She reports weight gain, unquantified amount.  Review of her records show that her weight has been stable since June 2021. -Review of her medical records did not include any abdominal imaging. -She denies any diabetes nor antidiabetic medications.  She did not tolerate spironolactone.  Only medication she is taking currently are melatonin, Propine glycol, Benadryl as needed, Tylenol as needed"  Assessment and Plan: Regina Marsh is a 75 y.o. female with: Rash and other nonspecific skin eruption Patient started to have resistant hypertension in June 2021.  Followed by endocrinology and cardiology. In December 2021 she was started on spironolactone and 2 weeks afterwards noted facial rash/edema which traveled down to her body, difficulty breathing and mouth sores. Sometimes feel flushed and has diarrhea. Stopped the above medication but had about 15 additional episodes the last 2-3 months. Benadryl seems to help. Zyrtec causes dizziness and Claritin worsens her Sjogren's symptoms. She has been limiting her diet as concerned about food allergies causing these symptoms.  Discussed with patient at length that her symptoms  are not typical of food allergies or environmental allergies. However patient would still like to rule things out.  Today's skin testing showed: Negative to indoor/outdoor allergens and food panel. Results given.   I'm not sure what's causing her symptoms but it does not seem to be food allergy or environmental allergy related.  No limitations in diet.  Continue to take lactaid pills for the lactose intolerance.   See below for proper skin care.  Recommend taking the following as symptoms seem to be responsive to antihistamines.   Take allegra 180mg  1 tablet daily at night - if it causes too much dryness or drowsiness take 1/2 tablet at night.   Take famotidine 20mg  1 tablet daily at night.   Keep track of episodes and take pictures when it flares. Avoid the following potential triggers: alcohol, tight clothing, NSAIDs, getting overheated.  . Get bloodwork to rule out other etiologies - some concern for mast cell issues.   Angio-edema Facial edema. Up to date with cancer screening tests.  Get bloodwork to rule out HAE.   Drug reaction  Continue to avoid spironolactone and penicillin.  Return in about 4 weeks (around 01/07/2021).  Follow up with endocrinologist and cardiologist as scheduled.   Meds ordered this encounter  Medications  . famotidine (PEPCID) 20 MG tablet    Sig: Take 1 tablet (20 mg total) by mouth at bedtime.    Dispense:  30 tablet    Refill:  3    Lab Orders     CBC with Differential/Platelet     ANA w/Reflex     Alpha-Gal Panel     Chronic Urticaria     Comprehensive metabolic panel     C-reactive protein     Sedimentation rate     Complement component c1q     C1 Esterase Inhibitor     C1 esterase inhibitor, functional     C3 and C4     Thyroid Cascade Profile     Tryptase  Other allergy screening: Asthma: no Rhino conjunctivitis: not really Food allergy: not sure  Currently avoiding multiple vegetables.  Past work up includes: no prior  food allergy testing. Dietary History: patient has been eating other foods including lactose intolerant, eggs, treenuts, shellfish, fish, soy, wheat, meats, fruits and vegetables. No recent peanut, sesame, soy ingestion.  Medication allergy: yes Hymenoptera allergy: no History of recurrent infections suggestive of immunodeficency: no  Diagnostics: Skin Testing: Environmental allergy panel and food panel. Negative to indoor/outdoor allergens and food panel. Results discussed with patient/family.  Airborne Adult Perc - 12/10/20 1135    Time Antigen Placed 1130    Allergen Manufacturer 01/09/2021    Location Back    Number of Test 59    1. Control-Buffer 50% Glycerol Negative    2. Control-Histamine 1 mg/ml 2+    3. Albumin saline Negative    4. Bahia Negative    5. 02/09/21 Negative    6. Johnson Negative    7.  [place] Blue Negative    8. Meadow Fescue Negative    9. Perennial Rye Negative    10. Sweet Vernal Negative    11. Timothy Negative    12. Cocklebur Negative    13. Burweed Marshelder Negative    14. Ragweed, short Negative    15. Ragweed, Giant Negative    16. Plantain,  English Negative    17. Lamb's Quarters Negative    18. Sheep Sorrell Negative    19. Rough Pigweed Negative    20. Marsh Elder, Rough Negative    21. Mugwort, Common Negative    22. Ash mix Negative    23. Birch mix Negative    24. Beech American Negative    25. Box, Elder Negative    26. Cedar, red Negative    27. Cottonwood, Guinea-Bissau Negative    28. Elm mix Negative    29. Hickory Negative    30. Maple mix Negative    31. Oak, Guinea[place] mix Negative    32. Pecan Pollen Negative    33. Pine mix Negative    34. Sycamore Eastern Negative    35. Walnut, Black Pollen Negative    36. Alternaria alternata Negative    37. Cladosporium Herbarum Negative    38. Aspergillus mix Negative    39. Penicillium mix Negative    40. Bipolaris sorokiniana (Helminthosporium) Negative    41. Drechslera spicifera  (Curvularia) Negative    42. Mucor plumbeus Negative    43. Fusarium moniliforme Negative    44. Aureobasidium pullulans (pullulara) Negative    45. Rhizopus oryzae Negative    46. Botrytis cinera Negative    47. Epicoccum nigrum Negative    48. Phoma betae Negative    49. Candida Albicans Negative    50. Trichophyton mentagrophytes Negative    51. Mite, D Farinae  5,000 AU/ml Negative    52. Mite, D Pteronyssinus  5,000 AU/ml Negative    53. Cat Hair 10,000 BAU/ml Negative    54.  Dog Epithelia Negative    55. Mixed Feathers Negative    56. Horse Epithelia Negative    57. Cockroach, German Negative    58. Mouse Negative    59. Tobacco Leaf Negative          Food Adult Perc - 12/10/20 1100    Time Antigen Placed 1130    Allergen Manufacturer Waynette Buttery    Location Back    Number of allergen test 72     Control-buffer 50% Glycerol Negative    Control-Histamine 1 mg/ml 2+    1. Peanut Negative    2. Soybean Negative    3. Wheat Negative    4. Sesame Negative    5. Milk, cow Negative    6. Egg White, Chicken Negative    7. Casein Negative    8. Shellfish Mix Negative    9. Fish Mix Negative    10. Cashew Negative    11. Pecan Food Negative    12. Walnut Food Negative    13. Almond Negative    14. Hazelnut Negative    15. [place] nut Negative    16. Coconut Negative    17. Pistachio Negative    18. Catfish Negative    19. Bass Negative    20. Trout Negative    21. Tuna Negative    22. Salmon Negative    23. Flounder Negative    24. Codfish Negative    25. Shrimp Negative    26.  Crab Negative    27. Lobster Negative    28. Oyster Negative    29. Scallops Negative    30. Barley Negative    31. Oat  Negative    32. Rye  Negative    33. Hops Negative    34. Rice Negative    35. Cottonseed Negative    36. Saccharomyces Cerevisiae  Negative    37. Pork Negative    38. [place] Meat Negative    39. Chicken Meat Negative    40. Beef Negative    41. Lamb Negative     42. Tomato Negative    43. White Potato Negative    44. Sweet Potato Negative    45. Pea, Green/English Negative    46. Navy Bean Negative    47. Mushrooms Negative    48. Avocado Negative    49. Onion Negative    50. Cabbage Negative    51. Carrots Negative    52. Celery Negative    53. Corn Negative    54. Cucumber Negative    55. Grape (White seedless) Negative    56. Orange  Negative    57. Banana Negative    58. Apple Negative    59. Peach Negative    60. Strawberry Negative    61. Cantaloupe Negative    62. Watermelon Negative    63. Pineapple Negative    64. Chocolate/Cacao bean Negative    65. Karaya Gum Negative    66. Acacia (Arabic Gum) Negative    67. Cinnamon Negative    68. Nutmeg Negative    69. Ginger Negative    70. Garlic Negative    71. Pepper, black Negative    72. Mustard Negative           Past Medical History: Patient Active Problem List   Diagnosis Date Noted  . Pruritus 12/10/2020  . Rash and other nonspecific skin eruption 12/10/2020  . Drug reaction 12/10/2020  . Lactose intolerance 12/10/2020  . Adverse reaction to food, subsequent encounter 12/10/2020  . Angio-edema 12/10/2020  . Endocrine disorder, unspecified 11/27/2020  . Sjogren's syndrome (HCC) 06/23/2020  . Essential hypertension 06/23/2020  . Episodic lightheadedness 06/23/2020   Past Medical History:  Diagnosis Date  . Hypertension   . Low back pain   . Sjogren's disease [facility])    Past Surgical History: Past Surgical History:  Procedure Laterality Date  . ADENOIDECTOMY    . BREAST LUMPECTOMY    . CESAREAN SECTION    . detached eye lid    . LAPAROSCOPY    . SALIVARY GLAND SURGERY    . SINOSCOPY    . TONSILLECTOMY AND ADENOIDECTOMY     Medication List:  Current Outpatient Medications  Medication Sig Dispense Refill  . acetaminophen (TYLENOL) 325 MG tablet Take 650 mg by mouth every 6 (six) hours as needed.    . diphenhydrAMINE HCl (BENADRYL ALLERGY PO) Take  0.5-1 tablets by mouth daily as needed.    . famotidine (PEPCID) 20 MG tablet Take 1 tablet (20 mg total) by mouth at bedtime. 30 tablet 3  . hydrochlorothiazide (HYDRODIURIL) 25 MG tablet Take 25 mg by mouth daily.    . Melatonin 500 MCG TBDP Take by mouth daily as needed.    [name] Propylene Glycol (SYSTANE BALANCE OP) Apply 1 drop to eye in the morning and at bedtime.     No current facility-administered medications for this visit.   Allergies: Allergies  Allergen Reactions  . African  Mango [Irvingia Gabonensis]   . Penicillins   . Spironolactone Rash   Social History: Social History   Socioeconomic History  . Marital status: Married    Spouse name: Not on file  . Number of children: Not on file  . Years of education: Not on file  . Highest education level: Not on file  Occupational History  . Not on file  Tobacco Use  . Smoking status: Never Smoker  . Smokeless tobacco: Never Used  Vaping Use  . Vaping Use: Never used  Substance and Sexual Activity  . Alcohol use: Not Currently  . Drug use: Not Currently  . Sexual activity: Not on file  Other Topics Concern  . Not on file  Social History Narrative  . Not on file   Social Determinants of Health   Financial Resource Strain: Not on file  Food Insecurity: Not on file  Transportation Needs: Not on file  Physical Activity: Not on file  Stress: Not on file  Social Connections: Not on file   Lives in a 76 year old house. Smoking: denies Occupation: not employed  Environmental HistorySurveyor, minerals: Water Damage/mildew in the house: no Engineer, civil (consulting)Carpet in the family room: yes Carpet in the bedroom: yes Heating: gas and electric Cooling: central Pet: no  Family History: Family History  Problem Relation Age of Onset  . Hypertension Mother   . Stroke Father   . Cancer Father   . Allergic rhinitis Neg Hx   . Angioedema Neg Hx   . Asthma Neg Hx   . Atopy Neg Hx   . Eczema Neg Hx   . Immunodeficiency Neg Hx   . Urticaria Neg Hx     Review of Systems  Constitutional: Negative for appetite change, chills, fever and unexpected weight change.  HENT: Negative for congestion and rhinorrhea.   Eyes: Negative for itching.  Respiratory: Negative for cough, chest tightness, shortness of breath and wheezing.   Cardiovascular: Negative for chest pain.  Gastrointestinal: Positive for diarrhea. Negative for abdominal pain.  Genitourinary: Negative for difficulty urinating.  Skin: Positive for rash.  Allergic/Immunologic: Negative for environmental allergies.  Neurological: Negative for headaches.   Objective: BP (!) 188/102   Pulse 90   Temp 98.2 F (36.8 C) (Temporal)   Resp 16   Ht 5\' 3"  (1.6 m)   Wt 165 lb (74.8 kg)   SpO2 98%   BMI 29.23 kg/m  Body mass index is 29.23 kg/m. Physical Exam Vitals and nursing note reviewed.  Constitutional:      Appearance: Normal appearance. She is well-developed.  HENT:     Head: Normocephalic and atraumatic.     Right Ear: Tympanic membrane and external ear normal.     Left Ear: Tympanic membrane and external ear normal.     Nose: Nose normal.     Mouth/Throat:     Mouth: Mucous membranes are moist.     Pharynx: Oropharynx is clear.  Eyes:     Conjunctiva/sclera: Conjunctivae normal.  Cardiovascular:     Rate and Rhythm: Normal rate and regular rhythm.     Heart sounds: Normal heart sounds. No murmur heard. No friction rub. No gallop.   Pulmonary:     Effort: Pulmonary effort is normal.     Breath sounds: Normal breath sounds. No wheezing, rhonchi or rales.  Musculoskeletal:     Cervical back: Neck supple.  Skin:    General: Skin is warm.     Findings: No rash.  Neurological:  Mental Status: She is alert and oriented to person, place, and time.  Psychiatric:        Behavior: Behavior normal.    The plan was reviewed with the patient/family, and all questions/concerned were addressed.  It was my pleasure to see Regina Marsh today and participate in her care.  Please feel free to contact me with any questions or concerns.  Sincerely,  Wyline Mood, DO Allergy & Immunology  [facility] office: [phone] [facility] office: [phone]

## 2020-12-10 ENCOUNTER — Encounter: Payer: Self-pay | Admitting: Allergy

## 2020-12-10 ENCOUNTER — Ambulatory Visit: Payer: Medicare Other | Admitting: Allergy

## 2020-12-10 ENCOUNTER — Other Ambulatory Visit: Payer: Self-pay

## 2020-12-10 VITALS — BP 188/102 | HR 90 | Temp 98.2°F | Resp 16 | Ht 63.0 in | Wt 165.0 lb

## 2020-12-10 DIAGNOSIS — E739 Lactose intolerance, unspecified: Secondary | ICD-10-CM

## 2020-12-10 DIAGNOSIS — L299 Pruritus, unspecified: Secondary | ICD-10-CM | POA: Diagnosis not present

## 2020-12-10 DIAGNOSIS — T783XXD Angioneurotic edema, subsequent encounter: Secondary | ICD-10-CM

## 2020-12-10 DIAGNOSIS — T50905A Adverse effect of unspecified drugs, medicaments and biological substances, initial encounter: Secondary | ICD-10-CM | POA: Insufficient documentation

## 2020-12-10 DIAGNOSIS — T50905D Adverse effect of unspecified drugs, medicaments and biological substances, subsequent encounter: Secondary | ICD-10-CM

## 2020-12-10 DIAGNOSIS — R21 Rash and other nonspecific skin eruption: Secondary | ICD-10-CM | POA: Diagnosis not present

## 2020-12-10 DIAGNOSIS — T783XXA Angioneurotic edema, initial encounter: Secondary | ICD-10-CM | POA: Insufficient documentation

## 2020-12-10 DIAGNOSIS — T781XXD Other adverse food reactions, not elsewhere classified, subsequent encounter: Secondary | ICD-10-CM | POA: Diagnosis not present

## 2020-12-10 LAB — CATECHOLAMINES, FRACTIONATED, URINE, 24 HOUR
Dopamine , 24H Ur: 135 ug/24 hr (ref 0–510)
Dopamine, Rand Ur: 75 ug/L
Epinephrine, 24H Ur: 4 ug/24 hr (ref 0–20)
Epinephrine, Rand Ur: 2 ug/L
Norepinephrine, 24H Ur: 41 ug/24 hr (ref 0–135)
Norepinephrine, Rand Ur: 23 ug/L

## 2020-12-10 MED ORDER — FAMOTIDINE 20 MG PO TABS: 20.0000 mg | ORAL_TABLET | Freq: Every day | ORAL | 3 refills | Status: DC

## 2020-12-10 NOTE — Assessment & Plan Note (Signed)
Facial edema. Up to date with cancer screening tests.  Get bloodwork to rule out HAE.

## 2020-12-10 NOTE — Patient Instructions (Addendum)
Today's skin testing showed: Negative to indoor/outdoor allergens and food panel. Results given.   Rash:  See below for proper skin care.  Not sure what's causing this issue bu it does not seem to be food allergy or environmental allergy related.  No limitations in diet.  Continue to take lactaid pills for the lactose intolerance.   Recommend taking the following:  Take allegra 180mg  1 tablet daily at night - if it causes too much dryness or drowsiness take 1/2 tablet at night.   Take famotidine 20mg  1 tablet daily at night.   Keep track of episodes and take pictures when it flares. Avoid the following potential triggers: alcohol, tight clothing, NSAIDs, getting overheated.   . Get bloodwork:  o We are ordering labs, so please allow 1-2 weeks for the results to come back. o With the newly implemented Cures Act, the labs might be visible to you at the same time that they become visible to me. However, I will not address the results until all of the results are back, so please be patient.  o In the meantime, continue recommendations in your patient instructions, including avoidance measures (if applicable), until you hear from me.  Drug allergies   Continue to avoid spironolactone and penicillin  Follow up in 1 months or sooner if needed.  Follow up with your endocrinologist and cardiologist as scheduled.   Skin care recommendations  Bath time: . Always use lukewarm water. AVOID very hot or cold water. Keep bathing time to 5-10 minutes. . Do NOT use bubble bath. . Use a mild soap and use just enough to wash the dirty areas. . Do NOT scrub skin vigorously.  . After bathing, pat dry your skin with a towel. Do NOT rub or scrub the skin.  Moisturizers and prescriptions:  . ALWAYS apply moisturizers immediately after bathing (within 3 minutes). This helps to lock-in moisture. . Use the moisturizer several times a day over the whole body. summer moisturizers include:  Aveeno, CeraVe, Cetaphil. [name] winter moisturizers include: Aquaphor, Vaseline, Cerave, Cetaphil, Eucerin, Vanicream. . When using moisturizers along with medications, the moisturizer should be applied about one hour after applying the medication to prevent diluting effect of the medication or moisturize around where you applied the medications. When not using medications, the moisturizer can be continued twice daily as maintenance.  Laundry and clothing: . Avoid laundry products with added color or perfumes. . Use unscented hypo-allergenic laundry products such as Tide free, Cheer free & gentle, and All free and clear.  . If the skin still seems dry or sensitive, you can try double-rinsing the clothes. . Avoid tight or scratchy clothing such as wool. . Do not use fabric softeners or dyer sheets.

## 2020-12-10 NOTE — Assessment & Plan Note (Addendum)
   Continue to avoid spironolactone and penicillin.

## 2020-12-10 NOTE — Assessment & Plan Note (Signed)
Patient started to have resistant hypertension in June 2021. Followed by endocrinology and cardiology. In December 2021 she was started on spironolactone and 2 weeks afterwards noted facial rash/edema which traveled down to her body, difficulty breathing and mouth sores. Sometimes feel flushed and has diarrhea. Stopped the above medication but had about 15 additional episodes the last 2-3 months. Benadryl seems to help. Zyrtec causes dizziness and Claritin worsens her Sjogren's symptoms. She has been limiting her diet as concerned about food allergies causing these symptoms.  Discussed with patient at length that her symptoms are not typical of food allergies or environmental allergies. However patient would still like to rule things out.  Today's skin testing showed: Negative to indoor/outdoor allergens and food panel. Results given.   I'm not sure what's causing her symptoms but it does not seem to be food allergy or environmental allergy related.  No limitations in diet.  Continue to take lactaid pills for the lactose intolerance.   See below for proper skin care.  Recommend taking the following as symptoms seem to be responsive to antihistamines.   Take allegra 180mg  1 tablet daily at night - if it causes too much dryness or drowsiness take 1/2 tablet at night.   Take famotidine 20mg  1 tablet daily at night.   Keep track of episodes and take pictures when it flares. Avoid the following potential triggers: alcohol, tight clothing, NSAIDs, getting overheated.  . Get bloodwork to rule out other etiologies - some concern for mast cell issues.

## 2020-12-14 ENCOUNTER — Other Ambulatory Visit: Payer: Self-pay

## 2020-12-14 ENCOUNTER — Encounter: Payer: Self-pay | Admitting: "Endocrinology

## 2020-12-14 ENCOUNTER — Ambulatory Visit: Payer: Medicare Other | Admitting: "Endocrinology

## 2020-12-14 VITALS — BP 148/96 | HR 68 | Ht 63.0 in | Wt 167.0 lb

## 2020-12-14 DIAGNOSIS — E349 Endocrine disorder, unspecified: Secondary | ICD-10-CM

## 2020-12-14 DIAGNOSIS — E559 Vitamin D deficiency, unspecified: Secondary | ICD-10-CM | POA: Diagnosis not present

## 2020-12-14 DIAGNOSIS — T783XXD Angioneurotic edema, subsequent encounter: Secondary | ICD-10-CM | POA: Diagnosis not present

## 2020-12-14 DIAGNOSIS — R21 Rash and other nonspecific skin eruption: Secondary | ICD-10-CM | POA: Diagnosis not present

## 2020-12-14 DIAGNOSIS — I10 Essential (primary) hypertension: Secondary | ICD-10-CM | POA: Diagnosis not present

## 2020-12-14 DIAGNOSIS — L299 Pruritus, unspecified: Secondary | ICD-10-CM | POA: Diagnosis not present

## 2020-12-14 LAB — POCT GLYCOSYLATED HEMOGLOBIN (HGB A1C): Hemoglobin A1C: 5.6 % (ref 4.0–5.6)

## 2020-12-14 NOTE — Progress Notes (Signed)
12/14/2020, 4:49 PM   Endocrinology follow-up note  Subjective:    Patient ID: Regina Marsh, female    DOB: [birth year], PCP Glenis Smoker, MD   Past Medical History:  Diagnosis Date  . Hypertension   . Low back pain   . Sjogren's disease [facility])    Past Surgical History:  Procedure Laterality Date  . ADENOIDECTOMY    . BREAST LUMPECTOMY    . CESAREAN SECTION    . detached eye lid    . LAPAROSCOPY    . SALIVARY GLAND SURGERY    . SINOSCOPY    . TONSILLECTOMY AND ADENOIDECTOMY     Social History   Socioeconomic History  . Marital status: Married    Spouse name: Not on file  . Number of children: Not on file  . Years of education: Not on file  . Highest education level: Not on file  Occupational History  . Not on file  Tobacco Use  . Smoking status: Never Smoker  . Smokeless tobacco: Never Used  Vaping Use  . Vaping Use: Never used  Substance and Sexual Activity  . Alcohol use: Not Currently  . Drug use: Not Currently  . Sexual activity: Not on file  Other Topics Concern  . Not on file  Social History Narrative  . Not on file   Social Determinants of Health   Financial Resource Strain: Not on file  Food Insecurity: Not on file  Transportation Needs: Not on file  Physical Activity: Not on file  Stress: Not on file  Social Connections: Not on file   Family History  Problem Relation Age of Onset  . Hypertension Mother   . Stroke Father   . Cancer Father   . Allergic rhinitis Neg Hx   . Angioedema Neg Hx   . Asthma Neg Hx   . Atopy Neg Hx   . Eczema Neg Hx   . Immunodeficiency Neg Hx   . Urticaria Neg Hx    Outpatient Encounter Medications as of 12/14/2020  Medication Sig  . acetaminophen (TYLENOL) 325 MG tablet Take 650 mg by mouth every 6 (six) hours as needed.  . diphenhydrAMINE HCl (BENADRYL ALLERGY PO) Take 0.5-1 tablets by mouth daily as needed.  . famotidine (PEPCID) 20 MG tablet  Take 1 tablet (20 mg total) by mouth at bedtime.  . hydrochlorothiazide (HYDRODIURIL) 25 MG tablet Take 25 mg by mouth daily.  . Melatonin 500 MCG TBDP Take by mouth daily as needed.  [name] Propylene Glycol (SYSTANE BALANCE OP) Apply 1 drop to eye in the morning and at bedtime.   No facility-administered encounter medications on file as of 12/14/2020.   ALLERGIES: Allergies  Allergen Reactions  . African Mango [Irvingia Gabonensis]   . Allegra [Fexofenadine] Other (See Comments)    Abdominal pain  . Penicillins   . Spironolactone Rash    VACCINATION STATUS:  There is no immunization history on file for this patient.  HPI Regina Marsh is 75 y.o. female who returns for follow-up after she was seen in consultation for  work-up to rule out Cushing syndrome/disease due to recent occurrence of facial flushing.   -His consult is requested by Glenis Smoker, MD.   See  notes from prior visits. She denies any prior history of endocrine dysfunction.  She does have hypertension  since June 2021.  She mentions the fact that it is difficult to control however she is taking only HCTZ 12.5 mg daily.  She does measure it at home regularly, the lowest measurement has been 148 over 80s.  She does have several clinic visits where a significant above target. -At one of her lab works she did have normal plasma metanephrines. -She denies any prior history of thyrotoxicosis, no exposure to heavy dose steroids on regular basis.  She denies any family history of endocrinopathies.  She has medical diagnosis of Sjogren's syndrome as well as lactose intolerance.  She observed that her allergy used to be only for milk and ice cream, however recently anything with dairy.  She is scheduled to see an allergist in the near future.  She showed a picture of her face at various times with flushing.  She also reports loose stools to diarrhea on and off.  She also wheezes at times.  She reports weight gain, unquantified  amount.  Review of her records show that her weight has been stable since June 2021. -Review of her medical records did not include any abdominal imaging. -She denies any diabetes nor antidiabetic medications.  She did not tolerate spironolactone.  Only medication she is taking currently are melatonin, Propine glycol, Benadryl as needed, Tylenol as needed.  Her complete 24-hour urine studies rule out Cushing syndrome, carcinoid syndrome, pheochromocytoma.  Her blood work also showed some hyperthyroidism. -She has no new complaints today.  Review of Systems  Constitutional: + Minimally fluctuating body weight, , no fatigue, no subjective hyperthermia, no subjective hypothermia   Objective:    Vitals with BMI 12/14/2020 12/10/2020 12/04/2020  Height _0  _1  _2   Weight 167 lbs 165 lbs 167 lbs  BMI 29.59 16.10 96.04  Systolic 540 981 191  Diastolic 96 478 295  Pulse 68 90 82    BP (!) 148/96   Pulse 68   Ht _3  (1.6 m)   Wt 167 lb (75.8 kg)   BMI 29.58 kg/m   Wt Readings from Last 3 Encounters:  12/14/20 167 lb (75.8 kg)  12/10/20 165 lb (74.8 kg)  12/04/20 167 lb (75.8 kg)    Physical Exam  Constitutional:  Body mass index is 29.58 kg/m.,  + Moderately anxious state of mind,.  CMP ( most recent) CMP     Component Value Date/Time   NA 141 10/28/2020 1610   K 4.3 10/28/2020 1610   CL 103 10/28/2020 1610   CO2 22 10/28/2020 1610   GLUCOSE 90 10/28/2020 1610   GLUCOSE 118 (H) 03/27/2020 1422   BUN 20 10/28/2020 1610   CREATININE 1.11 (H) 10/28/2020 1610   CALCIUM 9.5 10/28/2020 1610   PROT 7.6 03/27/2020 1422   ALBUMIN 4.3 03/27/2020 1422   AST 24 03/27/2020 1422   ALT 24 03/27/2020 1422   ALKPHOS 65 03/27/2020 1422   BILITOT 1.8 (H) 03/27/2020 1422   GFRNONAA 49 (L) 10/28/2020 1610   GFRAA 56 (L) 10/28/2020 1610   Recent Results (from the past 2160 hour(s))  Basic metabolic panel     Status: Abnormal   Collection Time: 10/28/20  4:10 PM  Result Value Ref  Range   Glucose 90 65 - 99 mg/dL   BUN 20 8 - 27 mg/dL   Creatinine, Ser 1.11 (H) 0.57 - 1.00 mg/dL   GFR calc non Af  Amer 49 (L) >59 mL/min/1.73   GFR calc Af Amer 56 (L) >59 mL/min/1.73    Comment: **In accordance with recommendations from the NKF-ASN Task force,**   [facility] is in the process of updating its eGFR calculation to the   2021 CKD-EPI creatinine equation that estimates kidney function   without a race variable.    BUN/Creatinine Ratio 18 12 - 28   Sodium 141 134 - 144 mmol/L   Potassium 4.3 3.5 - 5.2 mmol/L   Chloride 103 96 - 106 mmol/L   CO2 22 20 - 29 mmol/L   Calcium 9.5 8.7 - 10.3 mg/dL  Metanephrines, urine, 24 hour     Status: None   Collection Time: 11/29/20  7:00 AM  Result Value Ref Range   Normetanephrine, Ur 185 Undefined ug/L   Normetanephrine, 24H Ur 370 131 - 612 ug/24 hr   Metaneph Total, Ur 57 Undefined ug/L   Metanephrines, 24H Ur 114 36 - 209 ug/24 hr  Cortisol, urine, free     Status: None   Collection Time: 11/29/20  7:00 AM  Result Value Ref Range   Cortisol,F,ug/L,U 6 Undefined ug/L   Cortisol (Ur), Free 12 6 - 42 ug/24 hr  5 HIAA w/Creatinine, 24 hr.     Status: None   Collection Time: 11/30/20  9:18 AM  Result Value Ref Range   Creatinine, Urine 42.8 Not Estab. mg/dL   Creatinine, 24H Ur 856 800 - 1,800 mg/24 hr   5-HIAA, Ur 1.7 Undefined mg/L    Comment: This test was developed and its performance characteristics determined by [facility]. It has not been cleared or approved by the Food and Drug Administration.    5-HIAA,Quant.,24 Hr Urine 3.4 0.0 - 14.9 mg/24 hr  TSH     Status: None   Collection Time: 11/30/20  9:18 AM  Result Value Ref Range   TSH 2.380 0.450 - 4.500 uIU/mL  T4, free     Status: None   Collection Time: 11/30/20  9:18 AM  Result Value Ref Range   Free T4 1.27 0.82 - 1.77 ng/dL  T3, free     Status: None   Collection Time: 11/30/20  9:18 AM  Result Value Ref Range   T3, Free 3.0 2.0 - 4.4 pg/mL  Thyroid  peroxidase antibody     Status: None   Collection Time: 11/30/20  9:18 AM  Result Value Ref Range   Thyroperoxidase Ab SerPl-aCnc 9 0 - 34 IU/mL  Thyroglobulin antibody     Status: None   Collection Time: 11/30/20  9:18 AM  Result Value Ref Range   Thyroglobulin Antibody <1.0 0.0 - 0.9 IU/mL    Comment: Thyroglobulin Antibody measured by [employer] Methodology  VITAMIN D 25 Hydroxy (Vit-D Deficiency, Fractures)     Status: Abnormal   Collection Time: 11/30/20  9:18 AM  Result Value Ref Range   Vit D, 25-Hydroxy 19.4 (L) 30.0 - 100.0 ng/mL    Comment: Vitamin D deficiency has been defined by the [place] and an Endocrine Society practice guideline as a level of serum 25-OH vitamin D less than 20 ng/mL (1,2). The Endocrine Society went on to further define vitamin D insufficiency as a level between 21 and 29 ng/mL (2). 1. IOM (Institute of Medicine). 2010. Dietary reference    intakes for calcium and D. [place]: The    [employer]. 2. Holick MF, Binkley [place], Bischoff-Ferrari HA, et al.    Evaluation, treatment, and prevention of  vitamin D    deficiency: an Endocrine Society clinical practice    guideline. JCEM. 2011 Jul; 96(7):1911-30.   Catecholamines, fractionated, urine, 24 hour     Status: None   Collection Time: 12/01/20  7:00 AM  Result Value Ref Range   Epinephrine, Rand Ur 2 Undefined ug/L   Epinephrine, 24H Ur 4 0 - 20 ug/24 hr   Norepinephrine, Rand Ur 23 Undefined ug/L   Norepinephrine, 24H Ur 41 0 - 135 ug/24 hr   Dopamine, Rand Ur 75 Undefined ug/L   Dopamine , 24H Ur 135 0 - 510 ug/24 hr  HgB A1c     Status: None   Collection Time: 12/14/20  9:39 AM  Result Value Ref Range   Hemoglobin A1C 5.6 4.0 - 5.6 %   HbA1c POC (<> result, manual entry)     HbA1c, POC (prediabetic range)     HbA1c, POC (controlled diabetic range)        Assessment & Plan:   1. Endocrine disorder-ruled out 2.  Hypertension-uncontrolled 3.  Vitamin  D deficiency -I discussed all of her lab results which were done to rule out endocrinopathies with her and her husband in the room.  Her work-up rules out pheochromocytoma, carcinoid syndrome, Cushing syndrome.  Also ruled out her hyperthyroidism/thyroid dysfunction. Her labs confirm vitamin D deficiency, however vitamin deficiency is unlikely to explain her presentation.  - I have reviewed her available endocrine records and clinically evaluated the patient.  Her presentation symptoms are nonspecific for any particular endocrinopathy.  She does not have a diagnosis which requires prescription from endocrine point of view.  She would benefit from vitamin D replacement.  She is advised to initiate vitamin D3 5000 units daily.   Since she does not have biochemical evidence of endocrine dysfunction, she will not need imaging studies for now.   - I did not initiate any new prescriptions today. - she is advised to maintain close follow up with Glenis Smoker, MD for primary care needs.  She is advised to maintain close follow-up with her cardiologist, PCP, as well as keep her appointment with her allergist.       - Time spent on this patient care encounter:  30 minutes of which 50% was spent in  counseling and the rest reviewing  her current and  previous labs / studies and medications  doses and developing a plan for long term care, and documenting this care. [name]  participated in the discussions, expressed understanding, and voiced agreement with the above plans.  All questions were answered to her satisfaction. she is encouraged to contact clinic should she have any questions or concerns prior to her return visit.   Follow up plan: Return in about 1 year (around 12/14/2021) for F/U with no Labs.   Glade Lloyd, MD [facility] [address] [place], [place] [address] Phone: [phone]  Fax: [phone]     12/14/2020, 4:49  PM  This note was partially dictated with voice recognition software. Similar sounding words can be transcribed inadequately or may not  be corrected upon review.

## 2020-12-17 ENCOUNTER — Telehealth: Payer: Self-pay | Admitting: Cardiology

## 2020-12-17 NOTE — Telephone Encounter (Signed)
Spoke with Regina Marsh, she gave the okay for me to talk to Regina Marsh. Spoke with Regina Marsh, aware the patient is not taking lisinopril.

## 2020-12-17 NOTE — Telephone Encounter (Signed)
New Message:     She wants to know if the pt is taking Lisinopril Hydrochlorothiazide?

## 2020-12-23 LAB — CHRONIC URTICARIA: cu index: 22.1 — ABNORMAL HIGH (ref <10)

## 2020-12-23 LAB — C-REACTIVE PROTEIN: CRP: 10 mg/L (ref 0–10)

## 2020-12-23 LAB — COMPREHENSIVE METABOLIC PANEL
ALT: 20 IU/L (ref 0–32)
AST: 20 IU/L (ref 0–40)
Albumin/Globulin Ratio: 1.6 (ref 1.2–2.2)
Albumin: 4.6 g/dL (ref 3.7–4.7)
Alkaline Phosphatase: 75 IU/L (ref 44–121)
BUN/Creatinine Ratio: 12 (ref 12–28)
BUN: 12 mg/dL (ref 8–27)
Bilirubin Total: 0.8 mg/dL (ref 0.0–1.2)
CO2: 25 mmol/L (ref 20–29)
Calcium: 9.4 mg/dL (ref 8.7–10.3)
Chloride: 103 mmol/L (ref 96–106)
Creatinine, Ser: 1 mg/dL (ref 0.57–1.00)
Globulin, Total: 2.8 g/dL (ref 1.5–4.5)
Glucose: 103 mg/dL — ABNORMAL HIGH (ref 65–99)
Potassium: 3.6 mmol/L (ref 3.5–5.2)
Sodium: 144 mmol/L (ref 134–144)
Total Protein: 7.4 g/dL (ref 6.0–8.5)
eGFR: 59 mL/min/{1.73_m2} — ABNORMAL LOW (ref >59)

## 2020-12-23 LAB — CBC WITH DIFFERENTIAL/PLATELET
Basophils Absolute: 0.1 10*3/uL (ref 0.0–0.2)
Basos: 1 %
EOS (ABSOLUTE): 0 10*3/uL (ref 0.0–0.4)
Eos: 0 %
Hematocrit: 37.7 % (ref 34.0–46.6)
Hemoglobin: 13.1 g/dL (ref 11.1–15.9)
Immature Grans (Abs): 0.1 10*3/uL (ref 0.0–0.1)
Immature Granulocytes: 1 %
Lymphocytes Absolute: 1.9 10*3/uL (ref 0.7–3.1)
Lymphs: 24 %
MCH: 31.1 pg (ref 26.6–33.0)
MCHC: 34.7 g/dL (ref 31.5–35.7)
MCV: 90 fL (ref 79–97)
Monocytes Absolute: 0.5 10*3/uL (ref 0.1–0.9)
Monocytes: 7 %
Neutrophils Absolute: 5.4 10*3/uL (ref 1.4–7.0)
Neutrophils: 67 %
Platelets: 242 10*3/uL (ref 150–450)
RBC: 4.21 x10E6/uL (ref 3.77–5.28)
RDW: 12.1 % (ref 11.7–15.4)
WBC: 8 10*3/uL (ref 3.4–10.8)

## 2020-12-23 LAB — ALPHA-GAL PANEL
Allergen Lamb IgE: <0.1 kU/L
Beef IgE: 0.12 kU/L — AB
IgE (Immunoglobulin E), Serum: 13 IU/mL (ref 6–495)
O215-IgE Alpha-Gal: <0.1 kU/L
Pork IgE: <0.1 kU/L

## 2020-12-23 LAB — ANA W/REFLEX: Anti Nuclear Antibody (ANA): NEGATIVE

## 2020-12-23 LAB — SEDIMENTATION RATE: Sed Rate: 17 mm/hr (ref 0–40)

## 2020-12-23 LAB — COMPLEMENT COMPONENT C1Q: Complement C1Q: 16.7 mg/dL (ref 10.3–20.5)

## 2020-12-23 LAB — C3 AND C4
Complement C3, Serum: 174 mg/dL — ABNORMAL HIGH (ref 82–167)
Complement C4, Serum: 32 mg/dL (ref 12–38)

## 2020-12-23 LAB — C1 ESTERASE INHIBITOR, FUNCTIONAL: C1INH Functional/C1INH Total MFr SerPl: 95 %mean normal

## 2020-12-23 LAB — THYROID CASCADE PROFILE: TSH: 1.83 u[IU]/mL (ref 0.450–4.500)

## 2020-12-23 LAB — TRYPTASE: Tryptase: 9.2 ug/L (ref 2.2–13.2)

## 2020-12-23 LAB — C1 ESTERASE INHIBITOR: C1INH SerPl-mCnc: 36 mg/dL (ref 21–39)

## 2020-12-29 DIAGNOSIS — H04123 Dry eye syndrome of bilateral lacrimal glands: Secondary | ICD-10-CM | POA: Diagnosis not present

## 2020-12-29 DIAGNOSIS — H47021 Hemorrhage in optic nerve sheath, right eye: Secondary | ICD-10-CM | POA: Diagnosis not present

## 2020-12-29 DIAGNOSIS — H2513 Age-related nuclear cataract, bilateral: Secondary | ICD-10-CM | POA: Diagnosis not present

## 2021-01-01 DIAGNOSIS — M3500 Sicca syndrome, unspecified: Secondary | ICD-10-CM | POA: Diagnosis not present

## 2021-01-01 DIAGNOSIS — I10 Essential (primary) hypertension: Secondary | ICD-10-CM | POA: Diagnosis not present

## 2021-01-01 DIAGNOSIS — Z0000 Encounter for general adult medical examination without abnormal findings: Secondary | ICD-10-CM | POA: Diagnosis not present

## 2021-01-01 DIAGNOSIS — Z1211 Encounter for screening for malignant neoplasm of colon: Secondary | ICD-10-CM | POA: Diagnosis not present

## 2021-01-04 ENCOUNTER — Other Ambulatory Visit: Payer: Self-pay | Admitting: Family Medicine

## 2021-01-04 DIAGNOSIS — M8580 Other specified disorders of bone density and structure, unspecified site: Secondary | ICD-10-CM

## 2021-01-07 ENCOUNTER — Encounter: Payer: Self-pay | Admitting: Allergy

## 2021-01-07 ENCOUNTER — Other Ambulatory Visit: Payer: Self-pay

## 2021-01-07 ENCOUNTER — Ambulatory Visit: Payer: Medicare Other | Admitting: Allergy

## 2021-01-07 VITALS — BP 180/100 | HR 94 | Resp 17

## 2021-01-07 DIAGNOSIS — T783XXD Angioneurotic edema, subsequent encounter: Secondary | ICD-10-CM

## 2021-01-07 DIAGNOSIS — I10 Essential (primary) hypertension: Secondary | ICD-10-CM

## 2021-01-07 DIAGNOSIS — R21 Rash and other nonspecific skin eruption: Secondary | ICD-10-CM

## 2021-01-07 DIAGNOSIS — T50905D Adverse effect of unspecified drugs, medicaments and biological substances, subsequent encounter: Secondary | ICD-10-CM

## 2021-01-07 MED ORDER — FAMOTIDINE 20 MG PO TABS: 20.0000 mg | ORAL_TABLET | Freq: Two times a day (BID) | ORAL | 5 refills | Status: AC

## 2021-01-07 NOTE — Assessment & Plan Note (Signed)
Past history - In December 2021 she was started on spironolactone and 2 weeks afterwards noted facial rash/edema which traveled down to her body, difficulty breathing and mouth sores. Sometimes feel flushed and has diarrhea. Stopped the above medication but had about 15 additional episodes the last 2-3 months. Benadryl seems to help. Zyrtec causes dizziness and Claritin worsens her Sjogren's symptoms.  Interim history - Did not tolerate allegra. Taking famotidine 20mg  QHS, only having mild episodes about once per week. Bloodwork unremarkable except elevated CU.  INCREASE famotidine 20mg  to twice a day.  Keep track of episodes and take pictures when it flares. Avoid the following potential triggers: alcohol, tight clothing, NSAIDs, getting overheated.  If not controlled, will add on montelukast next.

## 2021-01-07 NOTE — Assessment & Plan Note (Signed)
   Follow up with PCP regarding starting valsartan 80mg  daily. Okay to start from allergy perspective as she never had a reaction to this type of medication previously.

## 2021-01-07 NOTE — Progress Notes (Signed)
Follow Up Note  RE: Regina Marsh MRN: [id number] DOB: 1946/04/20 Date of Office Visit: 01/07/2021  Referring provider: Shon Hale, * Primary care provider: Shon Hale, MD  Chief Complaint: Rash  History of Present Illness: I had the pleasure of seeing Regina Marsh for a follow up visit at the [facility] on 01/07/2021. She is a 75 y.o. female, who is being followed for rash, angioedema and adverse drug reaction. Her previous allergy office visit was on 12/10/2020 with Dr. Selena Batten. Today is a regular follow up visit.  Rash and other nonspecific skin eruption Usually has an outbreak about once a week around the face and chest area usually. Allegra 1/2 tablet caused some type of abdominal pains and causes dryness. Taking famotidine 20mg  daily at night.   Angio-edema Mild swelling episode with the rash/hives/heat on the face.   Blood pressure is still high and PCP prescribed valsartan which she hasn't taken yet.   Assessment and Plan: Amairani is a 75 y.o. female with: Rash and other nonspecific skin eruption Past history - In December 2021 she was started on spironolactone and 2 weeks afterwards noted facial rash/edema which traveled down to her body, difficulty breathing and mouth sores. Sometimes feel flushed and has diarrhea. Stopped the above medication but had about 15 additional episodes the last 2-3 months. Benadryl seems to help. Zyrtec causes dizziness and Claritin worsens her Sjogren's symptoms.  Interim history - Did not tolerate allegra. Taking famotidine 20mg  QHS, only having mild episodes about once per week. Bloodwork unremarkable except elevated CU.  INCREASE famotidine 20mg  to twice a day.  Keep track of episodes and take pictures when it flares. Avoid the following potential triggers: alcohol, tight clothing, NSAIDs, getting overheated.  If not controlled, will add on montelukast next.   Hypertension  Follow up with PCP  regarding starting valsartan 80mg  daily. Okay to start from allergy perspective as she never had a reaction to this type of medication previously.   Return in about 3 months (around 04/08/2021).  Meds ordered this encounter  Medications  . famotidine (PEPCID) 20 MG tablet    Sig: Take 1 tablet (20 mg total) by mouth 2 (two) times daily.    Dispense:  60 tablet    Refill:  5   Lab Orders  No laboratory test(s) ordered today    Diagnostics: None.  Medication List:  Current Outpatient Medications  Medication Sig Dispense Refill  . acetaminophen (TYLENOL) 325 MG tablet Take 650 mg by mouth every 6 (six) hours as needed.    . Cholecalciferol (VITAMIN D3) 50 MCG (2000 UT) TABS 1 tablet    . diphenhydrAMINE HCl (BENADRYL ALLERGY PO) Take 0.5-1 tablets by mouth daily as needed.    . famotidine (PEPCID) 20 MG tablet Take 1 tablet (20 mg total) by mouth 2 (two) times daily. 60 tablet 5  . hydrochlorothiazide (HYDRODIURIL) 25 MG tablet Take 25 mg by mouth daily.    . Melatonin 500 MCG TBDP Take by mouth daily as needed.    Propylene Glycol (SYSTANE BALANCE OP) Apply 1 drop to eye in the morning and at bedtime.    . valsartan (DIOVAN) 80 MG tablet Take 80 mg by mouth daily.     No current facility-administered medications for this visit.   Allergies: Allergies  Allergen Reactions  . African Mango [Irvingia Gabonensis]   . Allegra [Fexofenadine] Other (See Comments)    Abdominal pain  . Atorvastatin Other (See Comments)  .  Cetirizine Hcl Other (See Comments)  . Loratadine Other (See Comments)  . Penicillins Other (See Comments)  . Pravastatin Other (See Comments)  . Simvastatin Other (See Comments)  . Spironolactone Rash   I reviewed her past medical history, social history, family history, and environmental history and no significant changes have been reported from her previous visit.  Review of Systems  Constitutional: Negative for appetite change, chills, fever and unexpected  weight change.  HENT: Negative for congestion and rhinorrhea.   Eyes: Negative for itching.  Respiratory: Negative for cough, chest tightness, shortness of breath and wheezing.   Cardiovascular: Negative for chest pain.  Gastrointestinal: Negative for abdominal pain and diarrhea.  Genitourinary: Negative for difficulty urinating.  Skin: Positive for rash.  Allergic/Immunologic: Negative for environmental allergies.  Neurological: Negative for headaches.   Objective: BP (!) 180/100 Comment: being monitored by cardiologist  Pulse 94   Resp 17   SpO2 100%  There is no height or weight on file to calculate BMI. Physical Exam Vitals and nursing note reviewed.  Constitutional:      Appearance: Normal appearance. She is well-developed.  HENT:     Head: Normocephalic and atraumatic.     Right Ear: Tympanic membrane and external ear normal.     Left Ear: Tympanic membrane and external ear normal.     Nose: Nose normal.     Mouth/Throat:     Mouth: Mucous membranes are moist.     Pharynx: Oropharynx is clear.  Eyes:     Conjunctiva/sclera: Conjunctivae normal.  Cardiovascular:     Rate and Rhythm: Normal rate and regular rhythm.     Heart sounds: Normal heart sounds. No murmur heard. No friction rub. No gallop.   Pulmonary:     Effort: Pulmonary effort is normal.     Breath sounds: Normal breath sounds. No wheezing, rhonchi or rales.  Musculoskeletal:     Cervical back: Neck supple.  Skin:    General: Skin is warm.     Findings: No rash.  Neurological:     Mental Status: She is alert and oriented to person, place, and time.  Psychiatric:        Behavior: Behavior normal.    Previous notes and tests were reviewed. The plan was reviewed with the patient/family, and all questions/concerned were addressed.  It was my pleasure to see Regina Marsh today and participate in her care. Please feel free to contact me with any questions or concerns.  Sincerely,  Wyline Mood, DO Allergy &  Immunology  [facility] office: [phone] [facility] office: [phone]

## 2021-01-07 NOTE — Patient Instructions (Addendum)
Rash:  INCREASE famotidine 20mg  to twice a day.  Keep track of episodes and take pictures when it flares. Avoid the following potential triggers: alcohol, tight clothing, NSAIDs, getting overheated.   Drug allergies   Continue to avoid spironolactone and penicillin  Blood pressure  Follow up with PCP regarding starting valsartan 80mg  daily. Okay to start from allergy perspective as you never had a reaction to this.   Follow up in 3 months or sooner if needed.  Skin care recommendations  Bath time: . Always use lukewarm water. AVOID very hot or cold water. Keep bathing time to 5-10 minutes. . Do NOT use bubble bath. . Use a mild soap and use just enough to wash the dirty areas. . Do NOT scrub skin vigorously.  . After bathing, pat dry your skin with a towel. Do NOT rub or scrub the skin.  Moisturizers and prescriptions:  . ALWAYS apply moisturizers immediately after bathing (within 3 minutes). This helps to lock-in moisture. . Use the moisturizer several times a day over the whole body. summer moisturizers include: Aveeno, CeraVe, Cetaphil. [name] winter moisturizers include: Aquaphor, Vaseline, Cerave, Cetaphil, Eucerin, Vanicream. . When using moisturizers along with medications, the moisturizer should be applied about one hour after applying the medication to prevent diluting effect of the medication or moisturize around where you applied the medications. When not using medications, the moisturizer can be continued twice daily as maintenance.  Laundry and clothing: . Avoid laundry products with added color or perfumes. . Use unscented hypo-allergenic laundry products such as Tide free, Cheer free & gentle, and All free and clear.  . If the skin still seems dry or sensitive, you can try double-rinsing the clothes. . Avoid tight or scratchy clothing such as wool. . Do not use fabric softeners or dyer sheets.

## 2021-01-11 DIAGNOSIS — I10 Essential (primary) hypertension: Secondary | ICD-10-CM | POA: Diagnosis not present

## 2021-01-14 DIAGNOSIS — H524 Presbyopia: Secondary | ICD-10-CM | POA: Diagnosis not present

## 2021-01-20 ENCOUNTER — Ambulatory Visit: Payer: Medicare Other

## 2021-01-20 ENCOUNTER — Telehealth: Payer: Self-pay | Admitting: Cardiology

## 2021-01-20 NOTE — Telephone Encounter (Signed)
Spoke with Purvis Kilts , previous information about blood pressure was from appointment on 01/01/21.  An appointment will be made for patient with [facility] to  assist with blood pressure and statin   -possible  Medication  Intolerance with multiple medication in the past. Last primary  office notes and labs will be faxed for schedule appointment .  RN will contact patient about appointment.

## 2021-01-20 NOTE — Telephone Encounter (Signed)
Called back to obtained more details.  Is this  information from a visit today?  If so did Dr Chanetta Marshall adjust patient's medications? Patient has an appointment on 02/12/21 with Dr Cristal Deer. Patient may possible will need an appointment with [facility] blood pressure clinic.

## 2021-01-20 NOTE — Telephone Encounter (Signed)
Regina Marsh, Dr. Christena Flake nurse calling to inform they are very concerned about retina bruising noted by the patient's eye doctor. She states it is very likely related to her uncontrolled BP. She states the patient has not tolerated the medications. She states they just tried valsartan, but had to stop it because it caused tongue swelling and coughing. She states the patient is extremely sensitive to alot of medications. She states the patient was last seen by them 3/25 and her potassium was 3.3. She states her BP was 215/99 in office and at home 158/160/88. She also states the patient has elevated cholesterol and cannot take statins. She states she does not need a call back, but if there are any questions her direct number is: [phone]

## 2021-01-20 NOTE — Telephone Encounter (Signed)
Spoke to patient . RN gave the rationale for patient to see [facility], but patient states she cannot come to on 4/11/10/20. Patient states she was not sure why she needed. RN  Explained that [facility] will be able to assist with treatment-- with Dr Cristal Deer.   Patient was agreeable to reschedule appointment to 01/21/21 with [facility].  Labs and last office note given to  [facility].

## 2021-01-21 ENCOUNTER — Other Ambulatory Visit: Payer: Self-pay

## 2021-01-21 ENCOUNTER — Ambulatory Visit (INDEPENDENT_AMBULATORY_CARE_PROVIDER_SITE_OTHER): Payer: Medicare Other | Admitting: Pharmacist Clinician (PhC)/ Clinical Pharmacy Specialist

## 2021-01-21 DIAGNOSIS — Z1211 Encounter for screening for malignant neoplasm of colon: Secondary | ICD-10-CM | POA: Diagnosis not present

## 2021-01-21 DIAGNOSIS — I10 Essential (primary) hypertension: Secondary | ICD-10-CM

## 2021-01-21 MED ORDER — AMLODIPINE BESYLATE 2.5 MG PO TABS: 2.5000 mg | ORAL_TABLET | Freq: Every day | ORAL | 3 refills | Status: DC

## 2021-01-21 NOTE — Patient Instructions (Signed)
Return for a a follow up appointment with Dr. Cristal Deer on May 6  Check your blood pressure at home daily (if able) and keep record of the readings.  Take your BP meds as follows:  Start amlodipine 2.5 mg once daily.    Bring all of your meds, your BP cuff and your record of home blood pressures to your next appointment.  Exercise as you're able, try to walk approximately 30 minutes per day.  Keep salt intake to a minimum, especially watch canned and prepared boxed foods.  Eat more fresh fruits and vegetables and fewer canned items.  Avoid eating in fast food restaurants.    HOW TO TAKE YOUR BLOOD PRESSURE: . Rest 5 minutes before taking your blood pressure. .  Don't smoke or drink caffeinated beverages for at least 30 minutes before. . Take your blood pressure before (not after) you eat. . Sit comfortably with your back supported and both feet on the floor (don't cross your legs). . Elevate your arm to heart level on a table or a desk. . Use the proper sized cuff. It should fit smoothly and snugly around your bare upper arm. There should be enough room to slip a fingertip under the cuff. The bottom edge of the cuff should be 1 inch above the crease of the elbow. . Ideally, take 3 measurements at one sitting and record the average.

## 2021-01-21 NOTE — Progress Notes (Signed)
01/22/2021 Regina Marsh [birth year] [id number]   HPI:  Regina Marsh is a 75 y.o. female patient of Dr Harrell Gave, who presents today for hypertension clinic evaluation.  In addition to hypertension, her medical history is significant for hyperlipidemia (LDL 179), Sjogrens' disease and vitamin D deficiency (19.4).  Per patient she had been in good health until last summer when she had an episode of epistaxis following bout of sneezing.   She ended up having her nose packed at urgent care, but went to the ED the following day because of systolic BP around 124.  Packing was left in place, she was given amlodipine 5 mg daily and asked to keep PCP appointment for 3 days out.   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  At that time it was noted she had been taking pseudoephedrine for 30 plus years.  Patient stated her blood pressure had always been well controlled until the epistaxis episode during summer.  Dr. Harrell Gave had her discontinue pseudoephedrine at that time.  Since that episode patient has had problems with rash that appears at random on her face, trunk and/or lower extremities.  States there is no pattern to when the rash appears and has been seeing allergy/immunology for evaluation. She is sensitive to medications, noting that most BP medications cause severe dizziness or seem to trigger rash/facial edema.  A 24-hr BP monitor showed her mean pressure to be 176/79 and she did not show any signs of dipping.  Allergy MD has started her on famotidine 20 mg bid and she notes that the rashes have decreased slightly, but still do occur (has only been on this dose for ~2 weeks).     Today she is in the office for reviewing possible options.  Most secondary causes of hypertension have been evaluated.  Would consider checking for hypoaldosteronism, if for no other reason that to eliminate all secondary causes, but all others have been considered.  Doubtful that  renal artery stenosis would come on this quickly.  She has chosen to discontinue the hydrochlorothiazide as she feels it is partially responsible for her elevated LDL level.  Explained that it would be extremely rare for this to happen, but she feels as though it needs to be discontinued.     Screening for Secondary Hypertension:  Causes 01/22/2021  Drugs/Herbals Screened     - Comments none identified  Renovascular HTN Not Screened  Sleep Apnea Not Screened  Hyperthyroidism Screened     - Comments TSH WNL 12/2020  Hypothyroidism Screened     - Comments TSH WNL 12/2020  Hyperaldosteronism Not Screened  Pheochromocytoma Screened     - Comments plasm (05/2020) and urine (11/2020) metanephrines both WNL  Cushing's Syndrome Screened     - Comments cortison WNL (11/2020)  Compliance Screened    Relevant Labs/Studies: Basic Labs Latest Ref Rng & Units 12/14/2020 10/28/2020 03/27/2020  Sodium 134 - 144 mmol/L 144 141 141  Potassium 3.5 - 5.2 mmol/L 3.6 4.3 3.4(L)  Creatinine 0.57 - 1.00 mg/dL 1.00 1.11(H) 0.91    Thyroid  Latest Ref Rng & Units 12/14/2020 11/30/2020  TSH 0.450 - 4.500 uIU/mL 1.830 2.380                Blood Pressure Goal:  130/80  Current Medications: none  Family Hx: father died at 37 (septicemia), mother at 32 (old age) - mother with hypertension in early years, but not with age; no siblings; 1 son now 71,  no hypertension  Social Hx: no tobacco, rare alcohol, stopped caffeine recently  Diet: working on low sodium, low sugar diet, no restaurant food since Covid; mix of meats and proteins, plenty of vegetables, mostly fresh; snacks on nuts or fruits; lactose intolerant  Exercise: none since this all started  Home BP readings: 140-160's  Intolerances: amlodipine - increased BP, spironolactone - rash, beta blockers - felt terrible  Labs: 01/11/21:  Na 141, K 3.4, Glu 108, BUN 18, SCr 1.05 eGFR 55   Wt Readings from Last 3 Encounters:  01/21/21 167 lb (75.8 kg)  12/14/20  167 lb (75.8 kg)  12/10/20 165 lb (74.8 kg)   BP Readings from Last 3 Encounters:  01/21/21 (!) 226/120  01/07/21 (!) 180/100  12/14/20 (!) 148/96   Pulse Readings from Last 3 Encounters:  01/21/21 92  01/07/21 94  12/14/20 68    Current Outpatient Medications  Medication Sig Dispense Refill  . acetaminophen (TYLENOL) 325 MG tablet Take 650 mg by mouth every 6 (six) hours as needed.    [name] amLODipine (NORVASC) 2.5 MG tablet Take 1 tablet (2.5 mg total) by mouth daily. 30 tablet 3  . Cholecalciferol (VITAMIN D3) 50 MCG (2000 UT) TABS 1 tablet    . diphenhydrAMINE HCl (BENADRYL ALLERGY PO) Take 0.5-1 tablets by mouth daily as needed.    . famotidine (PEPCID) 20 MG tablet Take 1 tablet (20 mg total) by mouth 2 (two) times daily. 60 tablet 5  . hydrochlorothiazide (HYDRODIURIL) 25 MG tablet Take 25 mg by mouth daily.    . Melatonin 500 MCG TBDP Take by mouth daily as needed.    [name] Propylene Glycol (SYSTANE BALANCE OP) Apply 1 drop to eye in the morning and at bedtime.     No current facility-administered medications for this visit.    Allergies  Allergen Reactions  . African Mango [Irvingia [place]ensis]   . Allegra [Fexofenadine] Other (See Comments)    Abdominal pain  . Amlodipine     lightheadedness  . Atorvastatin Other (See Comments)  . Cetirizine Hcl Other (See Comments)  . Chlorthalidone     lightheadedness  . Lisinopril-Hydrochlorothiazide     Light headed  . Loratadine Other (See Comments)  . Penicillins Other (See Comments)  . Pravastatin Other (See Comments)  . Simvastatin Other (See Comments)  . Valsartan     Myalgias, cough, tongue swelling  . Spironolactone Rash    Past Medical History:  Diagnosis Date  . Hypertension   . Low back pain   . Sjogren's disease ([place])     Blood pressure (!) 226/120, pulse 92, resp. rate 16, height '5\' 2"'  (1.575 m), weight 167 lb (75.8 kg), SpO2 98 %.  Hypertension Patient with difficult to control hypertension due to  multiple medications.  Reviewed options and determined we could re-challenge with low dose amlodipine, but otherwise are left with doxazosin or minoxidil as options (I don't think patient would be agreeable with tid dosing of hydralazine).  She is agreeable to this, and though I don't expect to get to near goal with amlodipine 2.5 mg.  Will start with this, and if she can tolerate, could hopefully add the doxazosin or minoxidil.  She is scheduled to see Dr. Harrell Gave in 3 weeks.  We can help after this appointment if further titration is needed.     Tommy Medal PharmD CPP [facility] [address] [place],  [id number] [phone]

## 2021-01-22 ENCOUNTER — Encounter: Payer: Self-pay | Admitting: Pharmacist Clinician (PhC)/ Clinical Pharmacy Specialist

## 2021-01-22 NOTE — Assessment & Plan Note (Signed)
Patient with difficult to control hypertension due to multiple medications.  Reviewed options and determined we could re-challenge with low dose amlodipine, but otherwise are left with doxazosin or minoxidil as options (I don't think patient would be agreeable with tid dosing of hydralazine).  She is agreeable to this, and though I don't expect to get to near goal with amlodipine 2.5 mg.  Will start with this, and if she can tolerate, could hopefully add the doxazosin or minoxidil.  She is scheduled to see Dr. Cristal Deer in 3 weeks.  We can help after this appointment if further titration is needed.

## 2021-01-27 MED ORDER — MINOXIDIL 2.5 MG PO TABS: 2.5000 mg | ORAL_TABLET | Freq: Every day | ORAL | 0 refills | Status: DC

## 2021-01-28 ENCOUNTER — Ambulatory Visit: Payer: Medicare Other

## 2021-01-28 DIAGNOSIS — M79672 Pain in left foot: Secondary | ICD-10-CM | POA: Diagnosis not present

## 2021-02-03 ENCOUNTER — Ambulatory Visit: Payer: Medicare Other | Admitting: Cardiology

## 2021-02-12 ENCOUNTER — Ambulatory Visit: Payer: Medicare Other | Admitting: Cardiology

## 2021-03-09 ENCOUNTER — Other Ambulatory Visit: Payer: Self-pay

## 2021-03-09 ENCOUNTER — Encounter: Payer: Self-pay | Admitting: Cardiology

## 2021-03-09 ENCOUNTER — Ambulatory Visit: Payer: Medicare Other | Admitting: Cardiology

## 2021-03-09 VITALS — BP 144/92 | HR 84 | Ht 62.0 in | Wt 166.2 lb

## 2021-03-09 DIAGNOSIS — I10 Essential (primary) hypertension: Secondary | ICD-10-CM | POA: Diagnosis not present

## 2021-03-09 DIAGNOSIS — Z7189 Other specified counseling: Secondary | ICD-10-CM | POA: Diagnosis not present

## 2021-03-09 DIAGNOSIS — E782 Mixed hyperlipidemia: Secondary | ICD-10-CM | POA: Diagnosis not present

## 2021-03-09 DIAGNOSIS — R42 Dizziness and giddiness: Secondary | ICD-10-CM | POA: Diagnosis not present

## 2021-03-09 DIAGNOSIS — R079 Chest pain, unspecified: Secondary | ICD-10-CM

## 2021-03-09 NOTE — Assessment & Plan Note (Signed)
Has optic nerve bruise. Started on valsartan by PCP to optimize BP. Has many concerns. Took valsartan, felt like she couldn't walk across the room, everything was spinning. Takes HCTZ at either breakfast or lunch . Tried amlodipine, had lightheadedness, severe stomach pain, nausea. Tried for 10 days. Notes that she has extensive allergies. Tried minoxidil as well, had face and tongue swelling on this, took benadryl and stopped. Checks BP at home, got a new cuff, runs 160-165 at home, but has been running 140s at doctor's visits. Recent cuff had initial reading of 136, then recheck was 204.

## 2021-03-09 NOTE — Assessment & Plan Note (Signed)
Trying to make lifestyle changes first. Reviewed recent numbers, Tchol 274, TG 229, HDL 51, LDL 179. She has not tolerated statins. Discussed zetia, she does not want to try any medications. We did discuss long term risk of heart attack and stroke and how we use medications to optimize this. She feels she is at her limit and cannot do anything more in terms of medications right now.   -we reviewed the pros/cons of calcium scores. A cardiac CT scan for coronary calcium is a non-invasive way of obtaining information about the presence, location and extent of calcified plaque in the coronary arteries--the vessels that supply oxygen-containing blood to the heart muscle. Calcified plaque results when there is a build-up of fat and other substances under the inner layer of the artery. This material can calcify which signals the presence of atherosclerosis, a disease of the vessel wall, also called coronary artery disease.  People with this disease have an increased risk for heart attacks. In addition, over time, progression of plaque build up (CAD) can narrow the arteries or even close off blood flow to the heart. Because calcium is a marker of CAD, the amount of calcium detected on a cardiac CT scan is a helpful prognostic tool.  -we reviewed the charts together which show the relationship between calcium score and 15 year all cause mortality -after shared decision making, she is willing to go forward with calcium score. Understands it is $99 out of pocket.

## 2021-03-09 NOTE — Progress Notes (Signed)
Cardiology Office Note:    Date:  03/09/2021   ID:  Regina Marsh, DOB September 09, 1946, MRN [id number]  PCP:  Regina Smoker, MD  Cardiologist:  Buford Dresser, MD  Referring MD: Regina Marsh, *   CC: follow up  History of Present Illness:    Regina Marsh is a 75 y.o. female with a hx of Sjogren's, hypertension who is seen for follow up today. I met her 06/23/20 as a new consult at the request of Regina Marsh, * for the evaluation and management of resistant hypertension.  Cardiac history:  metanephrines that are within normal limits (metanephrine 48, normetanephrine 141). ER note from 03/27/20. Noted to have elevated blood pressure after she had nasal packing for a nosebleed. No associated symptoms. BP noted as 214/91 at that time. Noted to be taking pseudophedrine at home. Started on amlodipine. Changed to lisinopril-HCTZ. See extensive note from 07/21/20. Possible reaction to spironolactone (rash), stopped.  Today: Previously, her ophthalmologist found a bruise on her optic nerve. She is also concerned about side effects of HCTZ or Valsartan. This is because in March, her blood pressure was higher than baseline. She has also noticed weight gain and increased fatigue recently. At home, her blood pressure has averaged in the 160s, but she notes it averages in 140s during clinic visits, and believes her machine may be inaccurate. She is sitting at the table with a relaxed arm during the readings. Of note, she is not feeling any chest pains that she does not associate with GERD.  Currently, she is taking the HCTZ either with breakfast or lunch. Since her visit with PharmD, she had lightheadedness, severe stomach pain, and nausea as side effects of Valsartan. After a few days, her face and tongue were swollen as side effects of Menadione. She has never been on Zetia. However, she would prefer to defer this until a later time, as she is worried about her recent  reactions and her current eye issues.     With her diet, she has been successful with reducing caffeine, salt, and sugar. She is also cutting back on meat and does not eat cheese. Typically, she eats one meal a day with snacks at other times.   She denies any shortness of breath, palpitations, or exertional symptoms. No headaches, lightheadedness, or syncope to report. Also has no lower extremity edema, orthopnea or PND.  She is scheduled for a bone density test in October.  Past Medical History:  Diagnosis Date  . Hypertension   . Low back pain   . Sjogren's disease [facility])     Past Surgical History:  Procedure Laterality Date  . ADENOIDECTOMY    . BREAST LUMPECTOMY    . CESAREAN SECTION    . detached eye lid    . LAPAROSCOPY    . SALIVARY GLAND SURGERY    . SINOSCOPY    . TONSILLECTOMY AND ADENOIDECTOMY      Current Medications: Current Outpatient Medications on File Prior to Visit  Medication Sig  . acetaminophen (TYLENOL) 325 MG tablet Take 650 mg by mouth every 6 (six) hours as needed.  [name] amLODipine (NORVASC) 2.5 MG tablet Take 1 tablet (2.5 mg total) by mouth daily.  . Cholecalciferol (VITAMIN D3) 50 MCG (2000 UT) TABS 1 tablet  . diphenhydrAMINE HCl (BENADRYL ALLERGY PO) Take 0.5-1 tablets by mouth daily as needed.  . famotidine (PEPCID) 20 MG tablet Take 1 tablet (20 mg total) by mouth 2 (two) times daily.  . hydrochlorothiazide (HYDRODIURIL)  25 MG tablet Take 25 mg by mouth daily.  . Melatonin 500 MCG TBDP Take by mouth daily as needed.  . minoxidil (LONITEN) 2.5 MG tablet Take 1 tablet (2.5 mg total) by mouth daily.  [name] Propylene Glycol (SYSTANE BALANCE OP) Apply 1 drop to eye in the morning and at bedtime.   No current facility-administered medications on file prior to visit.     Allergies:   African mango [irvingia gabonensis], Allegra [fexofenadine], Amlodipine, Atorvastatin, Cetirizine hcl, Chlorthalidone, Lisinopril-hydrochlorothiazide, Loratadine, Penicillins,  Pravastatin, Simvastatin, Valsartan, and Spironolactone   Social History   Tobacco Use  . Smoking status: Never Marsh  . Smokeless tobacco: Never Used  Vaping Use  . Vaping Use: Never used  Substance Use Topics  . Alcohol use: Not Currently  . Drug use: Not Currently    Family History: No family history of premature CAD  ROS:   Please see the history of present illness.   (+) Weight gain (+) Fatigue Additional pertinent ROS otherwise unremarkable.   EKGs/Labs/Other Studies Reviewed:    The following studies were reviewed today: Monitor 09/21/20 14 days of data recorded on Zio monitor. Patient had a min HR of 45 bpm, max HR of 184 bpm, and avg HR of 68 bpm. Predominant underlying rhythm was Sinus Rhythm. No VT, atrial fibrillation, high degree block, or pauses noted. 4 Supraventricular Tachycardia runs occurred, the run with the fastest interval lasting 6 beats with a max rate of 184 bpm, the longest lasting 11 beats with an avg rate of 109 bpm. Isolated atrial and ventricular ectopy was rare (<1%). There were 19 triggered events. These are all sinus rhythm based, 3 with PAC and 1 with PVC.  Ambulatory 24 hour BP monitoring 09/21/20 24 hour ambulatory blood pressure monitor reviewed. Remained hypertensive throughout study. Overall mean 176/79, HR 69. Systolic dipped less than 1% with sleep. Max reading 201/98.    EKG:  EKG is personally reviewed.   03/09/2021: EKG is not ordered today. 06/23/20: NSR at 83 bpm with poor r wave progression  Recent Labs: 12/14/2020: ALT 20; BUN 12; Creatinine, Ser 1.00; Hemoglobin 13.1; Platelets 242; Potassium 3.6; Sodium 144; TSH 1.830  Recent Lipid Panel No results found for: CHOL, TRIG, HDL, CHOLHDL, VLDL, LDLCALC, LDLDIRECT  Physical Exam:    VS:  BP (!) 144/92 (BP Location: Left Arm, Patient Position: Sitting, Cuff Size: Normal)   Pulse 84   Ht '5\' 2"'  (1.575 m)   Wt 166 lb 3.2 oz (75.4 kg)   SpO2 100%   BMI 30.40 kg/m     Wt  Readings from Last 3 Encounters:  03/09/21 166 lb 3.2 oz (75.4 kg)  01/21/21 167 lb (75.8 kg)  12/14/20 167 lb (75.8 kg)    GEN: Well nourished, well developed in no acute distress HEENT: Normal, moist mucous membranes NECK: No JVD CARDIAC: regular rhythm, normal S1 and S2, no rubs or gallops. No murmur. VASCULAR: Radial and DP pulses 2+ bilaterally. No carotid bruits RESPIRATORY:  Clear to auscultation without rales, wheezing or rhonchi  ABDOMEN: Soft, non-tender, non-distended MUSCULOSKELETAL:  Ambulates independently SKIN: Warm and dry, no edema NEUROLOGIC:  Alert and oriented x 3. No focal neuro deficits noted. PSYCHIATRIC:  Normal affect   ASSESSMENT:    1. Primary hypertension   2. Episodic lightheadedness   3. Cardiac risk counseling   4. Counseling on health promotion and disease prevention   5. Mixed hyperlipidemia   6. Chest pain of uncertain etiology    PLAN:  Primary hypertension Has optic nerve bruise. Started on valsartan by PCP to optimize BP. Has many concerns. Took valsartan, felt like she couldn't walk across the room, everything was spinning. Takes HCTZ at either breakfast or lunch . Tried amlodipine, had lightheadedness, severe stomach pain, nausea. Tried for 10 days. Notes that she has extensive allergies. Tried minoxidil as well, had face and tongue swelling on this, took benadryl and stopped. Checks BP at home, got a new cuff, runs 160-165 at home, but has been running 140s at doctor's visits. Recent cuff had initial reading of 136, then recheck was 204.   Mixed hyperlipidemia Trying to make lifestyle changes first. Reviewed recent numbers, Tchol 274, TG 229, HDL 51, LDL 179. She has not tolerated statins. Discussed zetia, she does not want to try any medications. We did discuss long term risk of heart attack and stroke and how we use medications to optimize this. She feels she is at her limit and cannot do anything more in terms of medications right now.    -we reviewed the pros/cons of calcium scores. A cardiac CT scan for coronary calcium is a non-invasive way of obtaining information about the presence, location and extent of calcified plaque in the coronary arteries--the vessels that supply oxygen-containing blood to the heart muscle. Calcified plaque results when there is a build-up of fat and other substances under the inner layer of the artery. This material can calcify which signals the presence of atherosclerosis, a disease of the vessel wall, also called coronary artery disease.  People with this disease have an increased risk for heart attacks. In addition, over time, progression of plaque build up (CAD) can narrow the arteries or even close off blood flow to the heart. Because calcium is a marker of CAD, the amount of calcium detected on a cardiac CT scan is a helpful prognostic tool.  -we reviewed the charts together which show the relationship between calcium score and 15 year all cause mortality -after shared decision making, she is willing to go forward with calcium score. Understands it is $99 out of pocket.  Chest pain of uncertain etiology Better with treating reflux.  Hypertension:  -continues to be above goal -had rash on spironolactone, stopped -has felt terrible on beta blockers in the past -amlodipine associated with lightheadedness, stomach pain, nausea -had face/tongue swelling on minoxidil -thus far, tolerating HCTZ -lab workup for secondary causes summarized in HPI  Episodic lightheadedness: improved on HCTZ alone  History of Sjogren's syndrome, now with unclear allergy/immune reaction -will hold on altering medications at this time while evaluation underway  Cardiac risk counseling and prevention recommendations: -recommend heart healthy/Mediterranean diet, with whole grains, fruits, vegetable, fish, lean meats, nuts, and olive oil. Limit salt. -recommend moderate walking, 3-5 times/week for 30-50 minutes each  session. Aim for at least 150 minutes.week. Goal should be pace of 3 miles/hours, or walking 1.5 miles in 30 minutes -recommend avoidance of tobacco products. Avoid excess alcohol.  Plan for follow up: I will see her back in 3 mos or sooner as needed.  Medication Adjustments/Labs and Tests Ordered: Current medicines are reviewed at length with the patient today.  Concerns regarding medicines are outlined above.  Orders Placed This Encounter  Procedures  . CT CARDIAC SCORING   No orders of the defined types were placed in this encounter.   Patient Instructions  Medication Instructions:  Your Physician recommend you continue on your current medication as directed.    *If you need a refill on your  cardiac medications before your next appointment, please call your pharmacy*   Lab Work: None ordered today   Testing/Procedures: Dr. Harrell Gave has ordered a CT coronary calcium score. This test is done at [address]. This is $99 out of pocket.   Coronary CalciumScan A coronary calcium scan is an imaging test used to look for deposits of calcium and other fatty materials (plaques) in the inner lining of the blood vessels of the heart (coronary arteries). These deposits of calcium and plaques can partly clog and narrow the coronary arteries without producing any symptoms or warning signs. This puts a person at risk for a heart attack. This test can detect these deposits before symptoms develop. Tell a health care provider about:  Any allergies you have.  All medicines you are taking, including vitamins, herbs, eye drops, creams, and over-the-counter medicines.  Any problems you or family members have had with anesthetic medicines.  Any blood disorders you have.  Any surgeries you have had.  Any medical conditions you have.  Whether you are pregnant or may be pregnant. What are the risks? Generally, this is a safe procedure. However, problems may occur,  including:  Harm to a pregnant woman and her unborn baby. This test involves the use of radiation. Radiation exposure can be dangerous to a pregnant woman and her unborn baby. If you are pregnant, you generally should not have this procedure done.  Slight increase in the risk of cancer. This is because of the radiation involved in the test. What happens before the procedure? No preparation is needed for this procedure. What happens during the procedure?  You will undress and remove any jewelry around your neck or chest.  You will put on a hospital gown.  Sticky electrodes will be placed on your chest. The electrodes will be connected to an electrocardiogram (ECG) machine to record a tracing of the electrical activity of your heart.  A CT scanner will take pictures of your heart. During this time, you will be asked to lie still and hold your breath for 2-3 seconds while a picture of your heart is being taken. The procedure may vary among health care providers and hospitals. What happens after the procedure?  You can get dressed.  You can return to your normal activities.  It is up to you to get the results of your test. Ask your health care provider, or the department that is doing the test, when your results will be ready. Summary  A coronary calcium scan is an imaging test used to look for deposits of calcium and other fatty materials (plaques) in the inner lining of the blood vessels of the heart (coronary arteries).  Generally, this is a safe procedure. Tell your health care provider if you are pregnant or may be pregnant.  No preparation is needed for this procedure.  A CT scanner will take pictures of your heart.  You can return to your normal activities after the scan is done. This information is not intended to replace advice given to you by your health care provider. Make sure you discuss any questions you have with your health care provider. Document Released: 03/24/2008  Document Revised: 08/15/2016 Document Reviewed: 08/15/2016 Elsevier Interactive Patient Education  2017 [place]: At [facility], you and your health needs are our priority.  As part of our continuing mission to provide you with exceptional heart care, we have created designated Provider Care Teams.  These  Care Teams include your primary Cardiologist (physician) and Advanced Practice Providers (APPs -  Physician Assistants and Nurse Practitioners) who all work together to provide you with the care you need, when you need it.  We recommend signing up for the patient portal called "MyChart".  Sign up information is provided on this After Visit Summary.  MyChart is used to connect with patients for Virtual Visits (Telemedicine).  Patients are able to view lab/test results, encounter notes, upcoming appointments, etc.  Non-urgent messages can be sent to your provider as well.   To learn more about what you can do with MyChart, go to [email or web address].    Your next appointment:   3 month(s) @ [address] [place], [place] [address]   The format for your next appointment:   In Person  Provider:   Buford Dresser, MD       [facility] Stumpf,acting as a scribe for Buford Dresser, MD.,have documented all relevant documentation on the behalf of Buford Dresser, MD,as directed by  Buford Dresser, MD while in the presence of Buford Dresser, MD.  I, Buford Dresser, MD, have reviewed all documentation for this visit. The documentation on 03/16/21 for the exam, diagnosis, procedures, and orders are all accurate and complete.  Signed, Buford Dresser, MD PhD 03/09/2021  [facility]

## 2021-03-09 NOTE — Patient Instructions (Signed)
Medication Instructions:  Your Physician recommend you continue on your current medication as directed.    *If you need a refill on your cardiac medications before your next appointment, please call your pharmacy*   Lab Work: None ordered today   Testing/Procedures: Dr. Cristal Deer has ordered a CT coronary calcium score. This test is done at [address]. This is $99 out of pocket.   Coronary CalciumScan A coronary calcium scan is an imaging test used to look for deposits of calcium and other fatty materials (plaques) in the inner lining of the blood vessels of the heart (coronary arteries). These deposits of calcium and plaques can partly clog and narrow the coronary arteries without producing any symptoms or warning signs. This puts a person at risk for a heart attack. This test can detect these deposits before symptoms develop. Tell a health care provider about:  Any allergies you have.  All medicines you are taking, including vitamins, herbs, eye drops, creams, and over-the-counter medicines.  Any problems you or family members have had with anesthetic medicines.  Any blood disorders you have.  Any surgeries you have had.  Any medical conditions you have.  Whether you are pregnant or may be pregnant. What are the risks? Generally, this is a safe procedure. However, problems may occur, including:  Harm to a pregnant woman and her unborn baby. This test involves the use of radiation. Radiation exposure can be dangerous to a pregnant woman and her unborn baby. If you are pregnant, you generally should not have this procedure done.  Slight increase in the risk of cancer. This is because of the radiation involved in the test. What happens before the procedure? No preparation is needed for this procedure. What happens during the procedure?  You will undress and remove any jewelry around your neck or chest.  You will put on a hospital gown.  Sticky  electrodes will be placed on your chest. The electrodes will be connected to an electrocardiogram (ECG) machine to record a tracing of the electrical activity of your heart.  A CT scanner will take pictures of your heart. During this time, you will be asked to lie still and hold your breath for 2-3 seconds while a picture of your heart is being taken. The procedure may vary among health care providers and hospitals. What happens after the procedure?  You can get dressed.  You can return to your normal activities.  It is up to you to get the results of your test. Ask your health care provider, or the department that is doing the test, when your results will be ready. Summary  A coronary calcium scan is an imaging test used to look for deposits of calcium and other fatty materials (plaques) in the inner lining of the blood vessels of the heart (coronary arteries).  Generally, this is a safe procedure. Tell your health care provider if you are pregnant or may be pregnant.  No preparation is needed for this procedure.  A CT scanner will take pictures of your heart.  You can return to your normal activities after the scan is done. This information is not intended to replace advice given to you by your health care provider. Make sure you discuss any questions you have with your health care provider. Document Released: 03/24/2008 Document Revised: 08/15/2016 Document Reviewed: 08/15/2016 Elsevier Interactive Patient Education  2017 [employer].     Follow-Up: At [facility], you and your health needs are our priority.  As part of our continuing mission to provide you with exceptional heart care, we have created designated Provider Care Teams.  These Care Teams include your primary Cardiologist (physician) and Advanced Practice Providers (APPs -  Physician Assistants and Nurse Practitioners) who all work together to provide you with the care you need, when you need it.  We recommend  signing up for the patient portal called "MyChart".  Sign up information is provided on this After Visit Summary.  MyChart is used to connect with patients for Virtual Visits (Telemedicine).  Patients are able to view lab/test results, encounter notes, upcoming appointments, etc.  Non-urgent messages can be sent to your provider as well.   To learn more about what you can do with MyChart, go to [email or web address].    Your next appointment:   3 month(s) @ [address] [place], [place] [address]   The format for your next appointment:   In Person  Provider:   Jodelle Red, MD

## 2021-03-09 NOTE — Assessment & Plan Note (Signed)
Better with treating reflux.

## 2021-03-16 ENCOUNTER — Other Ambulatory Visit: Payer: Medicare Other

## 2021-04-01 DIAGNOSIS — M79672 Pain in left foot: Secondary | ICD-10-CM | POA: Diagnosis not present

## 2021-04-07 NOTE — Progress Notes (Signed)
Follow Up Note  RE: Regina Marsh MRN: [id number] DOB: [birth year] Date of Office Visit: 04/08/2021  Referring provider: Shon Hale, * Primary care provider: Shon Hale, MD  Chief Complaint: Rash  History of Present Illness: I had the pleasure of seeing Regina Marsh for a follow up visit at the [facility] on 04/08/2021. She is a 75 y.o. female, who is being followed for rash. Her previous allergy office visit was on 01/07/2021 with Dr. Selena Batten. Today is a regular follow up visit.  Rash and other nonspecific skin eruption Patient noted rash flare up after eating Roma tomatoes, green bell peppers, and raw almonds.  Symptoms usually occur 1 hour after ingestion and resolve within 1 hour of taking benadryl.  Tolerates red/yellow peppers, or certain tomatoes with no issues.  Currently taking famotidine 20mg  twice a day and noticed about 90% improvement in symptoms with no side effects.   She noted some reflux like symptoms when doing fast walking and lifting anything heavy which resolve within 15 minutes.   Diagnosed with complex regional pain syndrome and was prescribed gabapentin by her sports medicine doctor. She is hesitant in taking it.    Hypertension Patient developed facials welling and rash after taking 1 dose of valsartan. Currently on HCTZ which works but making her Sjogren's worse.  Assessment and Plan: Bedie is a 75 y.o. female with: Rash and other nonspecific skin eruption Past history - In December 2021 she was started on spironolactone and 2 weeks afterwards noted facial rash/edema which traveled down to her body, difficulty breathing and mouth sores. Sometimes feel flushed and has diarrhea. Stopped the above medication but had about 15 additional episodes the last 2-3 months. Benadryl seems to help. Zyrtec causes dizziness and Claritin worsens her Sjogren's symptoms. Did not tolerate allegra. Bloodwork unremarkable except elevated  CU. Interim history -  Taking famotidine 20mg  Bid with 90% improvement in symptoms. Green bell peppers, roma tomatoes seem to flare symptoms.  Continue famotidine 20mg  twice a day. Keep track of episodes and take pictures when it flares. Continue to avoid foods that are bothersome - green bell peppers, Roma tomatoes. For mild symptoms you can take over the counter antihistamines such as Benadryl and monitor symptoms closely. If symptoms worsen or if you have severe symptoms including breathing issues, throat closure, significant swelling, whole body hives, severe diarrhea and vomiting, lightheadedness then inject epinephrine and seek immediate medical care afterwards.  Lactose intolerance May use lactose free milk or take a lactaid pill right before consuming anything with dairy.  Chest discomfort Reflux like symptoms when doing fast walking or lifting anything heavy. Follow up with cardiology.   Drug reaction Reaction to valsartan. Continue to avoid medications on your allergy list.  Okay to take gabapentin from allergy standpoint as she never had a reaction to this type of medication before - check with your eye doctor as well.   Primary hypertension Follow up with PCP/cardiology regarding your blood pressure.   Return in about 6 months (around 10/08/2021).  No orders of the defined types were placed in this encounter.  Lab Orders  No laboratory test(s) ordered today    Diagnostics: None.  Medication List:  Current Outpatient Medications  Medication Sig Dispense Refill   acetaminophen (TYLENOL) 325 MG tablet Take 650 mg by mouth every 6 (six) hours as needed.     Cholecalciferol (VITAMIN D3) 50 MCG (2000 UT) TABS 1 tablet     diphenhydrAMINE HCl (BENADRYL ALLERGY  PO) Take 0.5-1 tablets by mouth daily as needed.     famotidine (PEPCID) 20 MG tablet Take 1 tablet (20 mg total) by mouth 2 (two) times daily. 60 tablet 5   hydrochlorothiazide (HYDRODIURIL) 25 MG tablet Take 25 mg  by mouth daily.     Melatonin 500 MCG TBDP Take by mouth daily as needed.     Propylene Glycol (SYSTANE BALANCE OP) Apply 1 drop to eye in the morning and at bedtime.     gabapentin (NEURONTIN) 100 MG capsule Take 100 mg by mouth daily. (Patient not taking: Reported on 04/08/2021)     No current facility-administered medications for this visit.   Allergies: Allergies  Allergen Reactions   African Mango [Irvingia Gabonensis]    Allegra [Fexofenadine] Other (See Comments)    Abdominal pain   Amlodipine     lightheadedness   Atorvastatin Other (See Comments)   Cetirizine Hcl Other (See Comments)   Chlorthalidone     lightheadedness   Lisinopril-Hydrochlorothiazide     Light headed   Loratadine Other (See Comments)   Penicillins Other (See Comments)   Pravastatin Other (See Comments)   Simvastatin Other (See Comments)   Sulfa Antibiotics Other (See Comments)   Valsartan Other (See Comments)    Myalgias, cough, tongue swelling   Spironolactone Rash   I reviewed her past medical history, social history, family history, and environmental history and no significant changes have been reported from her previous visit.  Review of Systems  Constitutional:  Negative for appetite change, chills, fever and unexpected weight change.  HENT:  Negative for congestion and rhinorrhea.   Eyes:  Negative for itching.  Respiratory:  Negative for cough, chest tightness, shortness of breath and wheezing.   Cardiovascular:  Negative for chest pain.  Gastrointestinal:  Negative for abdominal pain and diarrhea.  Genitourinary:  Negative for difficulty urinating.  Skin:  Positive for rash.  Allergic/Immunologic: Negative for environmental allergies.  Neurological:  Negative for headaches.   Objective: BP (!) 142/98   Pulse 78   Resp 18   SpO2 99%  There is no height or weight on file to calculate BMI. Physical Exam Vitals and nursing note reviewed.  Constitutional:      Appearance: Normal  appearance. She is well-developed.  HENT:     Head: Normocephalic and atraumatic.     Right Ear: Tympanic membrane and external ear normal.     Left Ear: Tympanic membrane and external ear normal.     Nose: Nose normal.     Mouth/Throat:     Mouth: Mucous membranes are dry.     Pharynx: Oropharynx is clear.  Eyes:     Conjunctiva/sclera: Conjunctivae normal.  Cardiovascular:     Rate and Rhythm: Normal rate and regular rhythm.     Heart sounds: Normal heart sounds. No murmur heard.   No friction rub. No gallop.  Pulmonary:     Effort: Pulmonary effort is normal.     Breath sounds: Normal breath sounds. No wheezing, rhonchi or rales.  Musculoskeletal:     Cervical back: Neck supple.  Skin:    General: Skin is warm.     Findings: No rash.  Neurological:     Mental Status: She is alert and oriented to person, place, and time.  Psychiatric:        Behavior: Behavior normal.   Previous notes and tests were reviewed. The plan was reviewed with the patient/family, and all questions/concerned were addressed.  It was my pleasure  to see Alexah today and participate in her care. Please feel free to contact me with any questions or concerns.  Sincerely,  Wyline Mood, DO Allergy & Immunology  [facility] office: [phone] [facility] office: [phone]

## 2021-04-08 ENCOUNTER — Ambulatory Visit: Payer: Medicare Other | Admitting: Allergy

## 2021-04-08 ENCOUNTER — Encounter: Payer: Self-pay | Admitting: Allergy

## 2021-04-08 ENCOUNTER — Other Ambulatory Visit: Payer: Self-pay

## 2021-04-08 VITALS — BP 142/98 | HR 78 | Resp 18

## 2021-04-08 DIAGNOSIS — R0789 Other chest pain: Secondary | ICD-10-CM

## 2021-04-08 DIAGNOSIS — R21 Rash and other nonspecific skin eruption: Secondary | ICD-10-CM

## 2021-04-08 DIAGNOSIS — E739 Lactose intolerance, unspecified: Secondary | ICD-10-CM | POA: Diagnosis not present

## 2021-04-08 DIAGNOSIS — I10 Essential (primary) hypertension: Secondary | ICD-10-CM

## 2021-04-08 DIAGNOSIS — T50905D Adverse effect of unspecified drugs, medicaments and biological substances, subsequent encounter: Secondary | ICD-10-CM

## 2021-04-08 NOTE — Assessment & Plan Note (Signed)
   Follow up with PCP/cardiology regarding your blood pressure.

## 2021-04-08 NOTE — Patient Instructions (Addendum)
Rash: Continue famotidine 20mg  twice a day. Keep track of episodes and take pictures when it flares. Continue to avoid foods that are bothersome - green bell peppers, Roma tomatoes. For mild symptoms you can take over the counter antihistamines such as Benadryl and monitor symptoms closely. If symptoms worsen or if you have severe symptoms including breathing issues, throat closure, significant swelling, whole body hives, severe diarrhea and vomiting, lightheadedness then inject epinephrine and seek immediate medical care afterwards.  Lactose intolerance May use lactose free milk or take a lactaid pill right before consuming anything with dairy.  Drug allergies  Continue to avoid medications on your allergy list.   Blood pressure Follow up with PCP/cardiology regarding your blood pressure.   Chest pressure Follow up with cardiology.   Okay to take gabapentin from allergy standpoint - check with your eye doctor as well.   Follow up in 4-6 months or sooner if needed.  Skin care recommendations  Bath time: Always use lukewarm water. AVOID very hot or cold water. Keep bathing time to 5-10 minutes. Do NOT use bubble bath. Use a mild soap and use just enough to wash the dirty areas. Do NOT scrub skin vigorously.  After bathing, pat dry your skin with a towel. Do NOT rub or scrub the skin.  Moisturizers and prescriptions:  ALWAYS apply moisturizers immediately after bathing (within 3 minutes). This helps to lock-in moisture. Use the moisturizer several times a day over the whole body. Good summer moisturizers include: Aveeno, CeraVe, Cetaphil. Good winter moisturizers include: Aquaphor, Vaseline, Cerave, Cetaphil, Eucerin, Vanicream. When using moisturizers along with medications, the moisturizer should be applied about one hour after applying the medication to prevent diluting effect of the medication or moisturize around where you applied the medications. When not using medications,  the moisturizer can be continued twice daily as maintenance.  Laundry and clothing: Avoid laundry products with added color or perfumes. Use unscented hypo-allergenic laundry products such as Tide free, Cheer free & gentle, and All free and clear.  If the skin still seems dry or sensitive, you can try double-rinsing the clothes. Avoid tight or scratchy clothing such as wool. Do not use fabric softeners or dyer sheets.

## 2021-04-08 NOTE — Assessment & Plan Note (Signed)
   May use lactose free milk or take a lactaid pill right before consuming anything with dairy. 

## 2021-04-08 NOTE — Assessment & Plan Note (Signed)
Reflux like symptoms when doing fast walking or lifting anything heavy. . Follow up with cardiology.

## 2021-04-08 NOTE — Assessment & Plan Note (Signed)
Reaction to valsartan.  Continue to avoid medications on your allergy list.   Okay to take gabapentin from allergy standpoint as she never had a reaction to this type of medication before - check with your eye doctor as well.

## 2021-04-08 NOTE — Assessment & Plan Note (Signed)
Past history - In December 2021 she was started on spironolactone and 2 weeks afterwards noted facial rash/edema which traveled down to her body, difficulty breathing and mouth sores. Sometimes feel flushed and has diarrhea. Stopped the above medication but had about 15 additional episodes the last 2-3 months. Benadryl seems to help. Zyrtec causes dizziness and Claritin worsens her Sjogren's symptoms. Did not tolerate allegra. Bloodwork unremarkable except elevated CU. Interim history -  Taking famotidine 20mg  Bid with 90% improvement in symptoms. Green bell peppers, roma tomatoes seem to flare symptoms.   Continue famotidine 20mg  twice a day.  Keep track of episodes and take pictures when it flares.  Continue to avoid foods that are bothersome - green bell peppers, Roma tomatoes. . For mild symptoms you can take over the counter antihistamines such as Benadryl and monitor symptoms closely. If symptoms worsen or if you have severe symptoms including breathing issues, throat closure, significant swelling, whole body hives, severe diarrhea and vomiting, lightheadedness then inject epinephrine and seek immediate medical care afterwards.

## 2021-04-09 DIAGNOSIS — H47021 Hemorrhage in optic nerve sheath, right eye: Secondary | ICD-10-CM | POA: Diagnosis not present

## 2021-05-07 ENCOUNTER — Ambulatory Visit (INDEPENDENT_AMBULATORY_CARE_PROVIDER_SITE_OTHER)
Admission: RE | Admit: 2021-05-07 | Discharge: 2021-05-07 | Disposition: A | Discharge status: Home / Self Care | Payer: Self-pay | Source: Ambulatory Visit | Attending: Cardiology | Admitting: Cardiology

## 2021-05-07 ENCOUNTER — Other Ambulatory Visit: Payer: Self-pay

## 2021-05-07 DIAGNOSIS — Z7189 Other specified counseling: Secondary | ICD-10-CM

## 2021-05-07 DIAGNOSIS — E782 Mixed hyperlipidemia: Secondary | ICD-10-CM

## 2021-05-07 IMAGING — CT CT CARDIAC CORONARY ARTERY CALCIUM SCORE
3 series · 14 of 20 positions shown, 15 images · non-contrast
Comparison: None.
COMPARISON: None.

Addendum:
EXAM:
OVER-READ INTERPRETATION  CT CHEST

The following report is an over-read performed by radiologist Dr.
ABDAALI [REDACTED] on [DATE]. This over-read
does not include interpretation of cardiac or coronary anatomy or
pathology. The coronary calcium score interpretation by the
cardiologist is attached.
TECHNIQUE: A gated, non-contrast computed tomography scan of the heart was
performed using 3mm slice thickness. Axial images were analyzed on a
dedicated workstation. Calcium scoring of the coronary arteries was
performed using the Agatston method.

[Series 3: casc 3.0 bv41 2 bestsyst 37 % · axial · 0.45mm/px · z∈[-158,-86]mm · 4 of 41 slices shown, 5 images]
[im 9/41  vessel]
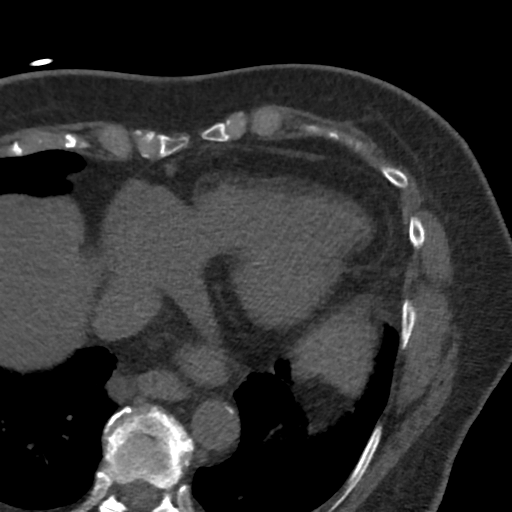
[im 9/41  lung]
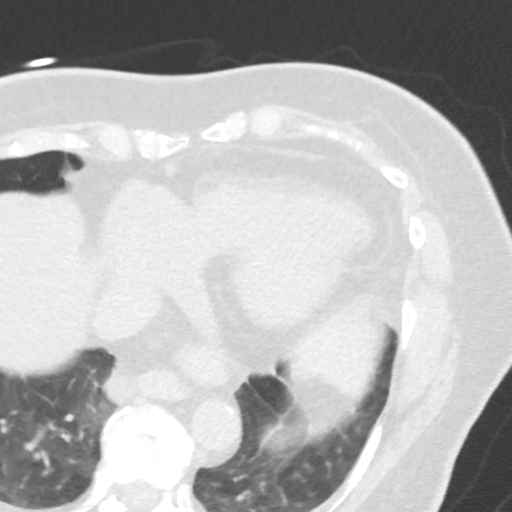
[im 17/41  vessel]
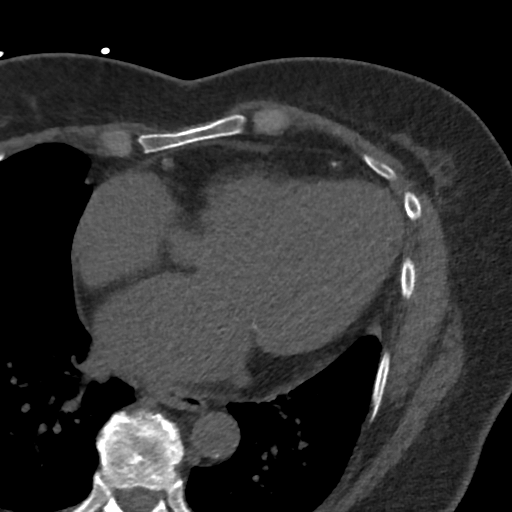
[im 25/41  vessel]
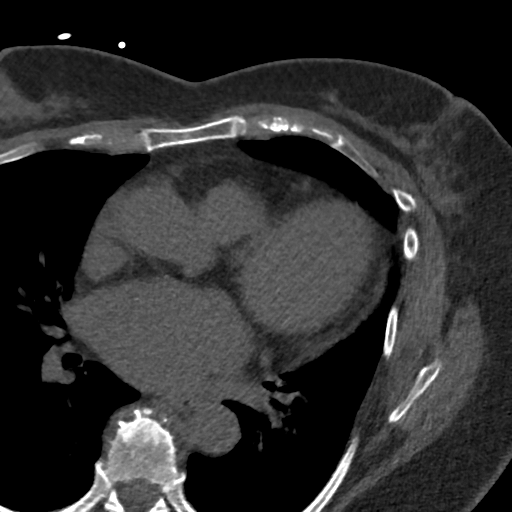
[im 33/41  vessel]
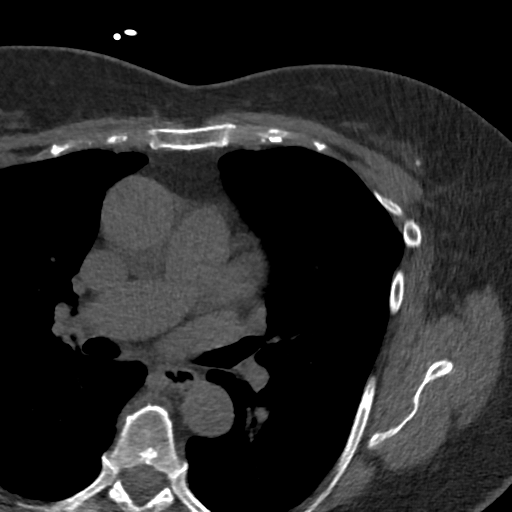

[Series 4: lung 37 % · axial · 0.66mm/px · z∈[-164,-84]mm · 5 of 41 slices shown]
[im 7/41  lung]
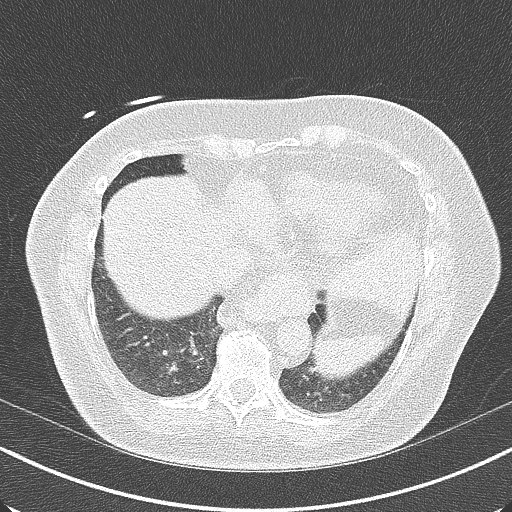
[im 14/41  lung]
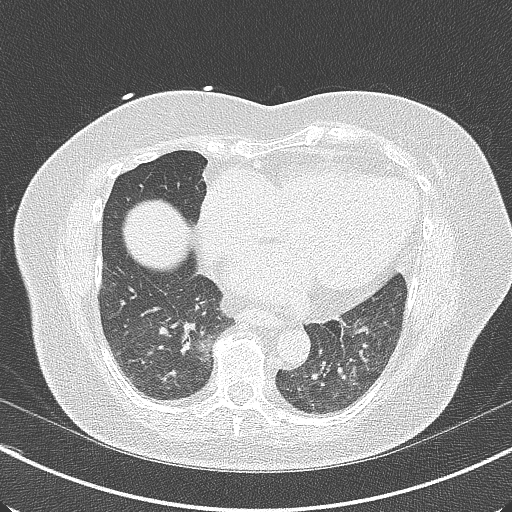
[im 21/41  lung]
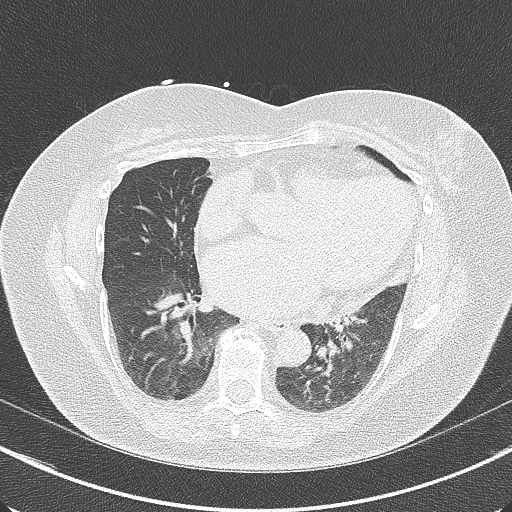
[im 27/41  lung]
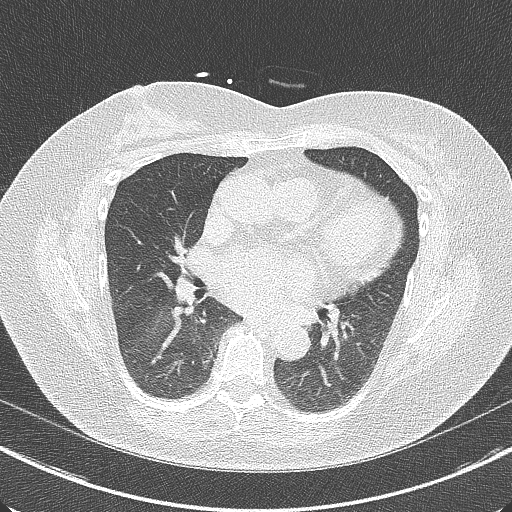
[im 34/41  lung]
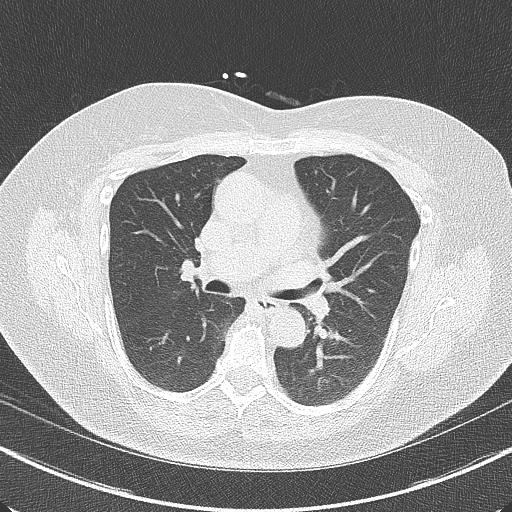

[Series 5: lung st 37 % · axial · 0.66mm/px · z∈[-164,-84]mm · 5 of 41 slices shown]
[im 7/41  lung]
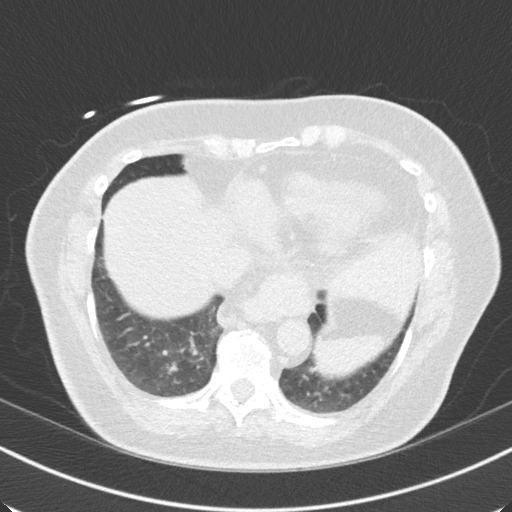
[im 14/41  lung]
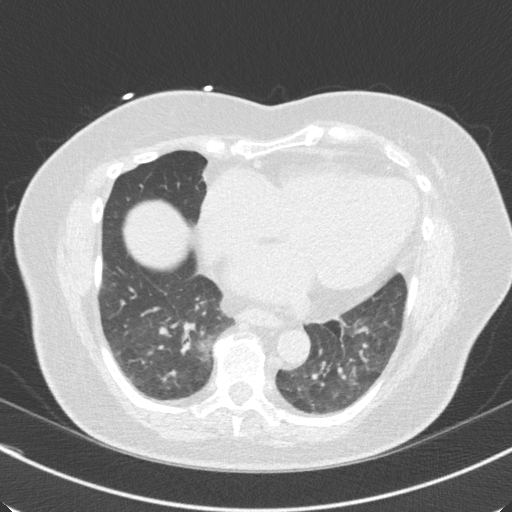
[im 21/41  lung]
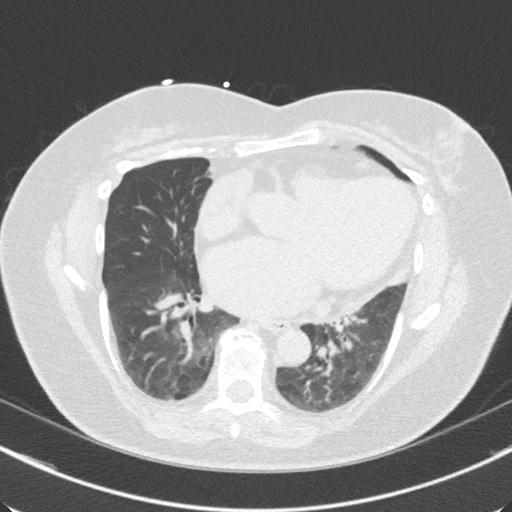
[im 27/41  lung]
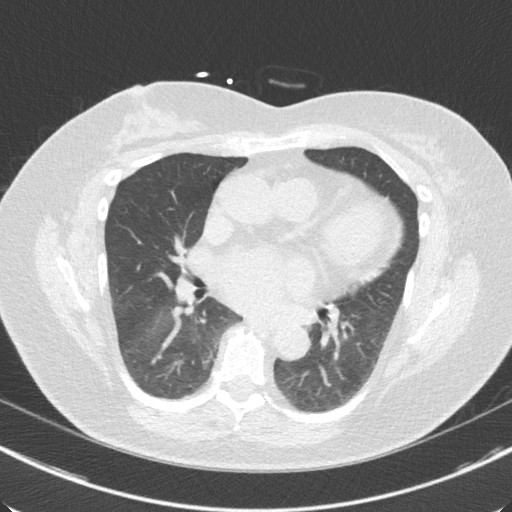
[im 34/41  lung]
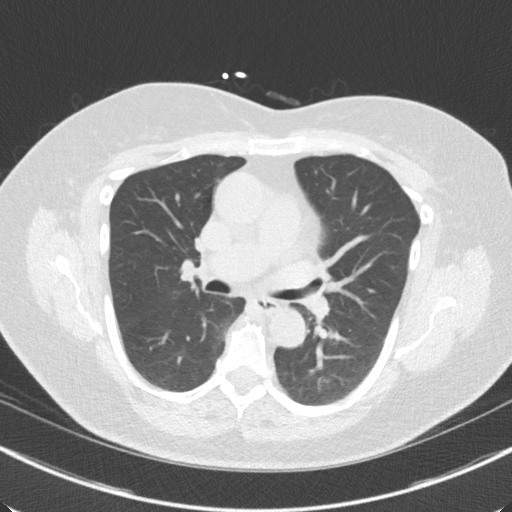

[14 of 20 positions shown; findings below may reference images not displayed]

FINDINGS: Vascular: Heart size is prominent. No significant pericardial fluid.
Normal caliber of the visualized thoracic aorta.

Mediastinum/Nodes: Small to moderate sized hiatal hernia. There may
be trace fluid or edema along the right side of the distal esophagus
near the hiatal hernia.

Lungs/Pleura: 5 mm nodule in the right lower lobe on sequence 4,
image 32. Additional small pleural-based nodules. Few densities in
the medial right lower lobe likely represent atelectasis. No large
pleural effusions. No significant airspace disease or consolidation.

Upper Abdomen: Images of upper abdomen are unremarkable.

Musculoskeletal: Multilevel degenerative disc and endplate changes
in thoracic spine.
IMPRESSION: 1. Small to moderate sized hiatal hernia. There may be trace fluid
or edema along the right side of the distal esophagus which is
nonspecific.
2. Small pulmonary nodules, largest measuring 5 mm in the right
lower lobe. These small pulmonary nodules are indeterminate. No
follow-up needed if patient is low-risk (and has no known or
suspected primary neoplasm). Non-contrast chest CT can be considered
in 12 months if patient is high-risk. This recommendation follows
the consensus statement: Guidelines for Management of Incidental
Pulmonary Nodules Detected on CT Images: From the [REDACTED]AL DATA:  Cardiovascular Disease Risk stratification

EXAM:
Coronary Calcium Score
FINDINGS: Coronary arteries: Normal origins.

Coronary Calcium Score:

Left main: 0

Left anterior descending artery:

Left circumflex artery: 0

Right coronary artery:

Total:

Percentile: 42nd

Pericardium: Normal.

Ascending Aorta: Normal caliber.

Non-cardiac: See separate report from [REDACTED].
IMPRESSION: Coronary calcium score of 27.7. This was 42nd percentile for age-,
race-, and sex-matched controls.



If CAC=0, it is reasonable to withhold statin therapy and reassess
in 5 to 10 years, as long as higher risk conditions are absent
(diabetes mellitus, family history of premature CHD in first degree
relatives (males <55 years; females <65 years), cigarette smoking,
or LDL >=190 mg/dL).

If CAC is 1 to 99, it is reasonable to initiate statin therapy for
patients >=55 years of age.

If CAC is >=100 or >=75th percentile, it is reasonable to initiate
statin therapy at any age.

Cardiology referral should be considered for patients with CAC
scores >=400 or >=75th percentile.

*[II] AHA/ACC/AACVPR/AAPA/ABC/ABDAALI/ABDAALI/ABDAALI/ABDAALI/ABDAALI/ABDAALI/ABDAALI
Guideline on the Management of Blood Cholesterol: A Report of the
American College of Cardiology/American Heart Association Task Force
on Clinical Practice Guidelines. J Am Coll Cardiol.
[II];73(24):[PHONE_NUMBER].

*** End of Addendum ***
EXAM:
OVER-READ INTERPRETATION  CT CHEST

The following report is an over-read performed by radiologist Dr.
ABDAALI [REDACTED] on [DATE]. This over-read
does not include interpretation of cardiac or coronary anatomy or
pathology. The coronary calcium score interpretation by the
cardiologist is attached.
FINDINGS: Vascular: Heart size is prominent. No significant pericardial fluid.
Normal caliber of the visualized thoracic aorta.

Mediastinum/Nodes: Small to moderate sized hiatal hernia. There may
be trace fluid or edema along the right side of the distal esophagus
near the hiatal hernia.

Lungs/Pleura: 5 mm nodule in the right lower lobe on sequence 4,
image 32. Additional small pleural-based nodules. Few densities in
the medial right lower lobe likely represent atelectasis. No large
pleural effusions. No significant airspace disease or consolidation.

Upper Abdomen: Images of upper abdomen are unremarkable.

Musculoskeletal: Multilevel degenerative disc and endplate changes
in thoracic spine.
IMPRESSION: 1. Small to moderate sized hiatal hernia. There may be trace fluid
or edema along the right side of the distal esophagus which is
nonspecific.
2. Small pulmonary nodules, largest measuring 5 mm in the right
lower lobe. These small pulmonary nodules are indeterminate. No
follow-up needed if patient is low-risk (and has no known or
suspected primary neoplasm). Non-contrast chest CT can be considered
in 12 months if patient is high-risk. This recommendation follows
the consensus statement: Guidelines for Management of Incidental
Pulmonary Nodules Detected on CT Images: From the [HOSPITAL]

## 2021-05-11 DIAGNOSIS — M79672 Pain in left foot: Secondary | ICD-10-CM | POA: Diagnosis not present

## 2021-05-11 DIAGNOSIS — A6920 Lyme disease, unspecified: Secondary | ICD-10-CM | POA: Diagnosis not present

## 2021-06-08 ENCOUNTER — Other Ambulatory Visit: Payer: Self-pay

## 2021-06-08 ENCOUNTER — Encounter (HOSPITAL_BASED_OUTPATIENT_CLINIC_OR_DEPARTMENT_OTHER): Payer: Self-pay | Admitting: Cardiology

## 2021-06-08 ENCOUNTER — Ambulatory Visit (HOSPITAL_BASED_OUTPATIENT_CLINIC_OR_DEPARTMENT_OTHER): Payer: Medicare Other | Admitting: Cardiology

## 2021-06-08 VITALS — BP 162/100 | HR 86 | Ht 62.0 in | Wt 169.0 lb

## 2021-06-08 DIAGNOSIS — I2510 Atherosclerotic heart disease of native coronary artery without angina pectoris: Secondary | ICD-10-CM

## 2021-06-08 DIAGNOSIS — I10 Essential (primary) hypertension: Secondary | ICD-10-CM

## 2021-06-08 DIAGNOSIS — Z7189 Other specified counseling: Secondary | ICD-10-CM

## 2021-06-08 DIAGNOSIS — E782 Mixed hyperlipidemia: Secondary | ICD-10-CM | POA: Diagnosis not present

## 2021-06-08 NOTE — Progress Notes (Signed)
Cardiology Office Note:    Date:  06/08/2021   ID:  Zoeya Gramajo, DOB [birth year], MRN [id number]  PCP:  Glenis Smoker, MD  Cardiologist:  Buford Dresser, MD  Referring MD: Glenis Smoker, *   CC: follow up  History of Present Illness:    Berlie Persky is a 75 y.o. female with a hx of Sjogren's, hypertension who is seen for follow up today. 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  Cardiac history:  metanephrines that are within normal limits (metanephrine 48, normetanephrine 141). ER note from 03/27/20. Noted to have elevated blood pressure after she had nasal packing for a nosebleed. No associated symptoms. BP noted as 214/91 at that time.   Today: Bruise on her optic nerve has resolved.   When she has had BP checked recently, has been 283 systolic. She had news of a friend passing just prior to her visit.   She recently saw Dr. Sheppard Coil. Dr. Redgie Grayer nurse has similar issues and recently started [facility] with improvement, so Ms. Kaseman has started this recently as well.  Has had intermittent chest discomfort, consistent with her hiatal hernia. Pain improves with Gas-X or taking benadryl. She has not seen a GI doctor. She will talk to Dr. Lindell Noe about this. She is having worsening lactose intolerance and very limited diet. Tomatoes continue to be a problem, and she cannot eat any nuts as it causes severe leg pain at night.  She feels like she is more limited in terms of her activity, and she is not sure why. She has recently been diagnosed with complex regional pain syndrome, which has limited her activity. Feels like she is losing strength. We discussed options for exercise. She walks 5000-11000 steps per day. When she was working, could be 16-20k steps/day.  Got a new blood pressure cuff, her readings have been not matching. She would get 662 systolic at  home ant then 140s at Dr. Redgie Grayer office. She will bring her cuff to her upcoming visit with Dr. Lindell Noe.   Denies shortness of breath at rest or with normal exertion. No PND, orthopnea, LE edema or unexpected weight gain. No syncope or palpitations.   Past Medical History:  Diagnosis Date   Hypertension    Low back pain    Sjogren's disease ([place])     Past Surgical History:  Procedure Laterality Date   ADENOIDECTOMY     BREAST LUMPECTOMY     CESAREAN SECTION     detached eye lid     LAPAROSCOPY     SALIVARY GLAND SURGERY     SINOSCOPY     TONSILLECTOMY AND ADENOIDECTOMY      Current Medications: Current Outpatient Medications on File Prior to Visit  Medication Sig   acetaminophen (TYLENOL) 325 MG tablet Take 650 mg by mouth every 6 (six) hours as needed.   Cholecalciferol (VITAMIN D3) 50 MCG (2000 UT) TABS 1 tablet   diphenhydrAMINE HCl (BENADRYL ALLERGY PO) Take 0.5-1 tablets by mouth daily as needed.   famotidine (PEPCID) 20 MG tablet Take 1 tablet (20 mg total) by mouth 2 (two) times daily.   gabapentin (NEURONTIN) 100 MG capsule Take 100 mg by mouth daily.   Garlic (GARLIQUE PO) Take by mouth.   hydrochlorothiazide (HYDRODIURIL) 25 MG tablet Take 25 mg by mouth daily.   Melatonin 500 MCG TBDP Take by mouth daily as needed.   OVER THE  COUNTER MEDICATION Take 1,500 mg by mouth daily. Beet root   Propylene Glycol (SYSTANE BALANCE OP) Apply 1 drop to eye in the morning and at bedtime.   No current facility-administered medications on file prior to visit.     Allergies:   African mango [irvingia gabonensis], Allegra [fexofenadine], Amlodipine, Atorvastatin, Cetirizine hcl, Chlorthalidone, Lisinopril-hydrochlorothiazide, Loratadine, Penicillins, Pravastatin, Simvastatin, Sulfa antibiotics, Valsartan, and Spironolactone   Social History   Tobacco Use   Smoking status: Never   Smokeless tobacco: Never  Vaping Use   Vaping Use: Never used  Substance Use Topics    Alcohol use: Not Currently   Drug use: Not Currently    Family History: No family history of premature CAD  ROS:   Please see the history of present illness.   Additional pertinent ROS otherwise unremarkable.   EKGs/Labs/Other Studies Reviewed:    The following studies were reviewed today: Calcium score 05/07/21 Coronary calcium score of 27.7. This was 42nd percentile for age-, race-, and sex-matched controls.  Monitor 09/21/20 14 days of data recorded on Zio monitor. Patient had a min HR of 45 bpm, max HR of 184 bpm, and avg HR of 68 bpm. Predominant underlying rhythm was Sinus Rhythm. No VT, atrial fibrillation, high degree block, or pauses noted. 4 Supraventricular Tachycardia runs occurred, the run with the fastest interval lasting 6 beats with a max rate of 184 bpm, the longest lasting 11 beats with an avg rate of 109 bpm. Isolated atrial and ventricular ectopy was rare (<1%). There were 19 triggered events. These are all sinus rhythm based, 3 with PAC and 1 with PVC.  Ambulatory 24 hour BP monitoring 09/21/20 24 hour ambulatory blood pressure monitor reviewed. Remained hypertensive throughout study. Overall mean 176/79, HR 69. Systolic dipped less than 1% with sleep. Max reading 201/98.    EKG:  EKG is personally reviewed.   06/08/2021 Sinus rhythm with 1st degree AV block, 86 bpm 03/09/2021: EKG is not ordered today. 06/23/20: NSR at 83 bpm with poor r wave progression  Recent Labs: 12/14/2020: ALT 20; BUN 12; Creatinine, Ser 1.00; Hemoglobin 13.1; Platelets 242; Potassium 3.6; Sodium 144; TSH 1.830  Recent Lipid Panel No results found for: CHOL, TRIG, HDL, CHOLHDL, VLDL, LDLCALC, LDLDIRECT  Physical Exam:    VS:  BP (!) 162/100   Pulse 86   Ht '5\' 2"'  (1.575 m)   Wt 169 lb (76.7 kg)   SpO2 98%   BMI 30.91 kg/m     Wt Readings from Last 3 Encounters:  06/08/21 169 lb (76.7 kg)  03/09/21 166 lb 3.2 oz (75.4 kg)  01/21/21 167 lb (75.8 kg)    GEN: Well nourished, well  developed in no acute distress HEENT: Normal, moist mucous membranes NECK: No JVD CARDIAC: regular rhythm, normal S1 and S2, no rubs or gallops. No murmur. VASCULAR: Radial and DP pulses 2+ bilaterally. No carotid bruits RESPIRATORY:  Clear to auscultation without rales, wheezing or rhonchi  ABDOMEN: Soft, non-tender, non-distended MUSCULOSKELETAL:  Ambulates independently SKIN: Warm and dry, no edema NEUROLOGIC:  Alert and oriented x 3. No focal neuro deficits noted. PSYCHIATRIC:  Normal affect    ASSESSMENT:    1. Primary hypertension   2. Mixed hyperlipidemia   3. Coronary artery calcification seen on CT scan   4. Cardiac risk counseling   5. Counseling on health promotion and disease prevention     PLAN:    Hypertension:  -continues to be above goal -had rash on spironolactone, stopped -has felt terrible  on beta blockers in the past -amlodipine associated with lightheadedness, stomach pain, nausea -had face/tongue swelling on minoxidil -lightheadedness on lisinopril -lightheadedness, nausea, stomach pain on valsartan -thus far, tolerating HCTZ -lab workup for secondary causes summarized in HPI -we reviewed guidelines and goals today. I do not expect that she will ever be within the guidelines of <130/80. She feels better on no medications, but her blood pressure can be very elevated. I would aim for 098J systolic with 19J-47W diastolic. This seems to be a compromise of how she feels and avoiding very elevated number. -her blood pressure cuff at home has been highly variable. Recommend she bring this to her upcoming PCP appt to check -overall very limited options for management given her reactions to medications  Chest pain: improved with reflux treatment, still occurs occasionally, better with gas-x or benadryl -she will talk to Dr. Lindell Noe about a GI referral  Mixed hyperlipidemia -She has not tolerated statins. Discussed zetia, she does not want to try any  medications. We did discuss long term risk of heart attack and stroke and how we use medications to optimize this. She feels she is at her limit and cannot do anything more in terms of medications right now.  -calcium score 05/07/21 showed Ca score=27.7  Cardiac risk counseling and prevention recommendations: -recommend heart healthy/Mediterranean diet, with whole grains, fruits, vegetable, fish, lean meats, nuts, and olive oil. Limit salt. -recommend moderate walking, 3-5 times/week for 30-50 minutes each session. Aim for at least 150 minutes.week. Goal should be pace of 3 miles/hours, or walking 1.5 miles in 30 minutes -recommend avoidance of tobacco products. Avoid excess alcohol.  Plan for follow up: I will see her back in 4 mos or sooner as needed.  Total time of encounter: 42 minutes total time of encounter, including 38 minutes spent in face-to-face patient care. This time includes coordination of care and counseling regarding symptoms, management options, guidelines. Remainder of non-face-to-face time involved reviewing chart documents/testing relevant to the patient encounter and documentation in the medical record.  Buford Dresser, MD, PhD, [facility]    Medication Adjustments/Labs and Tests Ordered: Current medicines are reviewed at length with the patient today.  Concerns regarding medicines are outlined above.  Orders Placed This Encounter  Procedures   EKG 12-Lead    No orders of the defined types were placed in this encounter.   Patient Instructions  Medication Instructions:  The current medical regimen is effective;  continue present plan and medications as directed. Please refer to the Current Medication list given to you today.   *If you need a refill on your cardiac medications before your next appointment, please call your pharmacy*  Lab Work:   Testing/Procedures:  NONE    NONE  Follow-Up: Your next appointment:  4 month(s) In  Person with Buford Dresser, MD   At [facility], you and your health needs are our priority.  As part of our continuing mission to provide you with exceptional heart care, we have created designated Provider Care Teams.  These Care Teams include your primary Cardiologist (physician) and Advanced Practice Providers (APPs -  Physician Assistants and Nurse Practitioners) who all work together to provide you with the care you need, when you need it.          Signed, Buford Dresser, MD PhD 06/08/2021  [facility]

## 2021-06-08 NOTE — Patient Instructions (Signed)
Medication Instructions:  The current medical regimen is effective;  continue present plan and medications as directed. Please refer to the Current Medication list given to you today.   *If you need a refill on your cardiac medications before your next appointment, please call your pharmacy*  Lab Work:   Testing/Procedures:  NONE    NONE  Follow-Up: Your next appointment:  4 month(s) In Person with Jodelle Red, MD   At [facility], you and your health needs are our priority.  As part of our continuing mission to provide you with exceptional heart care, we have created designated Provider Care Teams.  These Care Teams include your primary Cardiologist (physician) and Advanced Practice Providers (APPs -  Physician Assistants and Nurse Practitioners) who all work together to provide you with the care you need, when you need it.

## 2021-07-05 DIAGNOSIS — H938X1 Other specified disorders of right ear: Secondary | ICD-10-CM | POA: Diagnosis not present

## 2021-07-05 DIAGNOSIS — K449 Diaphragmatic hernia without obstruction or gangrene: Secondary | ICD-10-CM | POA: Diagnosis not present

## 2021-07-05 DIAGNOSIS — R222 Localized swelling, mass and lump, trunk: Secondary | ICD-10-CM | POA: Diagnosis not present

## 2021-07-05 DIAGNOSIS — I10 Essential (primary) hypertension: Secondary | ICD-10-CM | POA: Diagnosis not present

## 2021-07-30 ENCOUNTER — Encounter: Payer: Self-pay | Admitting: Gastroenterology

## 2021-08-03 ENCOUNTER — Other Ambulatory Visit: Payer: Self-pay

## 2021-08-03 ENCOUNTER — Ambulatory Visit
Admission: RE | Admit: 2021-08-03 | Discharge: 2021-08-03 | Disposition: A | Discharge status: Home / Self Care | Payer: Medicare Other | Source: Ambulatory Visit | Attending: Family Medicine | Admitting: Family Medicine

## 2021-08-03 DIAGNOSIS — M8589 Other specified disorders of bone density and structure, multiple sites: Secondary | ICD-10-CM | POA: Diagnosis not present

## 2021-08-03 DIAGNOSIS — Z780 Asymptomatic menopausal state: Secondary | ICD-10-CM | POA: Diagnosis not present

## 2021-08-03 DIAGNOSIS — M8580 Other specified disorders of bone density and structure, unspecified site: Secondary | ICD-10-CM

## 2021-08-09 ENCOUNTER — Ambulatory Visit: Payer: Medicare Other | Admitting: Gastroenterology

## 2021-08-09 ENCOUNTER — Encounter: Payer: Self-pay | Admitting: Gastroenterology

## 2021-08-09 VITALS — BP 210/96 | HR 80 | Ht 62.0 in | Wt 165.5 lb

## 2021-08-09 DIAGNOSIS — K449 Diaphragmatic hernia without obstruction or gangrene: Secondary | ICD-10-CM

## 2021-08-09 DIAGNOSIS — R1013 Epigastric pain: Secondary | ICD-10-CM

## 2021-08-09 DIAGNOSIS — E739 Lactose intolerance, unspecified: Secondary | ICD-10-CM | POA: Diagnosis not present

## 2021-08-09 DIAGNOSIS — K219 Gastro-esophageal reflux disease without esophagitis: Secondary | ICD-10-CM

## 2021-08-09 NOTE — Patient Instructions (Signed)
You have been scheduled for an Upper GI Series at ____________. Your appointment is on ______________ at ____________________. Please arrive 15 minutes prior to your test for registration. Make sure not to eat or drink anything after midnight on the night before your test. If you need to reschedule, please call radiology at [phone]. ________________________________________________________________ An upper GI series uses x rays to help diagnose problems of the upper GI tract, which includes the esophagus, stomach, and duodenum. The duodenum is the first part of the small intestine. An upper GI series is conducted by a radiology technologist or a radiologist--a doctor who specializes in x-ray imaging--at a hospital or outpatient center. While sitting or standing in front of an x-ray machine, the patient drinks barium liquid, which is often white and has a chalky consistency and taste. The barium liquid coats the lining of the upper GI tract and makes signs of disease show up more clearly on x rays. X-ray video, called fluoroscopy, is used to view the barium liquid moving through the esophagus, stomach, and duodenum. Additional x rays and fluoroscopy are performed while the patient lies on an x-ray table. To fully coat the upper GI tract with barium liquid, the technologist or radiologist may press on the abdomen or ask the patient to change position. Patients hold still in various positions, allowing the technologist or radiologist to take x rays of the upper GI tract at different angles. If a technologist conducts the upper GI series, a radiologist will later examine the images to look for problems.  This test typically takes about 1 hour to complete. __________________________________________________________________      Bonita Quin have been scheduled for an abdominal ultrasound at [facility] (1st floor of hospital) on _____________ at _________________. Please arrive 15 minutes prior to your  appointment for registration. Make certain not to have anything to eat or drink 6 hours prior to your appointment. Should you need to reschedule your appointment, please contact radiology at [phone]. This test typically takes about 30 minutes to perform.    You will be contacted by [facility] in the next 2 days to arrange a Ultrasound and a UGI Series .  The number on your caller ID will be [phone], please answer when they call.  If you have not heard from them in 2 days please call [phone] to schedule.      We wil send Lansoprazole to your pharmacy  Take Pepcid 20 mg at bedtime  Gastroesophageal Reflux Disease, Adult Gastroesophageal reflux (GER) happens when acid from the stomach flows up into the tube that connects the mouth and the stomach (esophagus). Normally, food travels down the esophagus and stays in the stomach to be digested. However, when a person has GER, food and stomach acid sometimes move back up into the esophagus. If this becomes a more serious problem, the person may be diagnosed with a disease called gastroesophageal reflux disease (GERD). GERD occurs when the reflux: Happens often. Causes frequent or severe symptoms. Causes problems such as damage to the esophagus. When stomach acid comes in contact with the esophagus, the acid may cause inflammation in the esophagus. Over time, GERD may create small holes (ulcers) in the lining of the esophagus. What are the causes? This condition is caused by a problem with the muscle between the esophagus and the stomach (lower esophageal sphincter, or LES). Normally, the LES muscle closes after food passes through the esophagus to the stomach. When the LES is weakened or abnormal, it does not  close properly, and that allows food and stomach acid to go back up into the esophagus. The LES can be weakened by certain dietary substances, medicines, and medical conditions, including: Tobacco  use. Pregnancy. Having a hiatal hernia. Alcohol use. Certain foods and beverages, such as coffee, chocolate, onions, and peppermint. What increases the risk? You are more likely to develop this condition if you: Have an increased body weight. Have a connective tissue disorder. Take NSAIDs, such as ibuprofen. What are the signs or symptoms? Symptoms of this condition include: Heartburn. Difficult or painful swallowing and the feeling of having a lump in the throat. A bitter taste in the mouth. Bad breath and having a large amount of saliva. Having an upset or bloated stomach and belching. Chest pain. Different conditions can cause chest pain. Make sure you see your health care provider if you experience chest pain. Shortness of breath or wheezing. Ongoing (chronic) cough or a nighttime cough. Wearing away of tooth enamel. Weight loss. How is this diagnosed? This condition may be diagnosed based on a medical history and a physical exam. To determine if you have mild or severe GERD, your health care provider may also monitor how you respond to treatment. You may also have tests, including: A test to examine your stomach and esophagus with a small camera (endoscopy). A test that measures the acidity level in your esophagus. A test that measures how much pressure is on your esophagus. A barium swallow or modified barium swallow test to show the shape, size, and functioning of your esophagus. How is this treated? Treatment for this condition may vary depending on how severe your symptoms are. Your health care provider may recommend: Changes to your diet. Medicine. Surgery. The goal of treatment is to help relieve your symptoms and to prevent complications. Follow these instructions at home: Eating and drinking  Follow a diet as recommended by your health care provider. This may involve avoiding foods and drinks such as: Coffee and tea, with or without caffeine. Drinks that contain  alcohol. Energy drinks and sports drinks. Carbonated drinks or sodas. Chocolate and cocoa. Peppermint and mint flavorings. Garlic and onions. Horseradish. Spicy and acidic foods, including peppers, chili powder, curry powder, vinegar, hot sauces, and barbecue sauce. Citrus fruit juices and citrus fruits, such as oranges, lemons, and limes. Tomato-based foods, such as red sauce, chili, salsa, and pizza with red sauce. Fried and fatty foods, such as donuts, french fries, potato chips, and high-fat dressings. High-fat meats, such as hot dogs and fatty cuts of red and white meats, such as rib eye steak, sausage, ham, and bacon. High-fat dairy items, such as whole milk, butter, and cream cheese. Eat small, frequent meals instead of large meals. Avoid drinking large amounts of liquid with your meals. Avoid eating meals during the 2-3 hours before bedtime. Avoid lying down right after you eat. Do not exercise right after you eat. Lifestyle  Do not use any products that contain nicotine or tobacco. These products include cigarettes, chewing tobacco, and vaping devices, such as e-cigarettes. If you need help quitting, ask your health care provider. Try to reduce your stress by using methods such as yoga or meditation. If you need help reducing stress, ask your health care provider. If you are overweight, reduce your weight to an amount that is healthy for you. Ask your health care provider for guidance about a safe weight loss goal. General instructions Pay attention to any changes in your symptoms. Take over-the-counter and prescription medicines only  as told by your health care provider. Do not take aspirin, ibuprofen, or other NSAIDs unless your health care provider told you to take these medicines. Wear loose-fitting clothing. Do not wear anything tight around your waist that causes pressure on your abdomen. Raise (elevate) the head of your bed about 6 inches (15 cm). You can use a wedge to do  this. Avoid bending over if this makes your symptoms worse. Keep all follow-up visits. This is important. Contact a health care provider if: You have: New symptoms. Unexplained weight loss. Difficulty swallowing or it hurts to swallow. Wheezing or a persistent cough. A hoarse voice. Your symptoms do not improve with treatment. Get help right away if: You have sudden pain in your arms, neck, jaw, teeth, or back. You suddenly feel sweaty, dizzy, or light-headed. You have chest pain or shortness of breath. You vomit and the vomit is green, yellow, or black, or it looks like blood or coffee grounds. You faint. You have stool that is red, bloody, or black. You cannot swallow, drink, or eat. These symptoms may represent a serious problem that is an emergency. Do not wait to see if the symptoms will go away. Get medical help right away. Call your local emergency services (911 in the [place] Do not drive yourself to the hospital. Summary Gastroesophageal reflux happens when acid from the stomach flows up into the esophagus. GERD is a disease in which the reflux happens often, causes frequent or severe symptoms, or causes problems such as damage to the esophagus. Treatment for this condition may vary depending on how severe your symptoms are. Your health care provider may recommend diet and lifestyle changes, medicine, or surgery. Contact a health care provider if you have new or worsening symptoms. Take over-the-counter and prescription medicines only as told by your health care provider. Do not take aspirin, ibuprofen, or other NSAIDs unless your health care provider told you to do so. Keep all follow-up visits as told by your health care provider. This is important. This information is not intended to replace advice given to you by your health care provider. Make sure you discuss any questions you have with your health care provider. Document Revised: 04/06/2020 Document Reviewed:  04/06/2020 Elsevier Patient Education  2022 [employer] Inc[employer]    Lactose-Free Diet, Adult If you have lactose intolerance, you are not able to digest lactose. Lactose is a natural sugar found mainly in dairy milk and dairy products. A lactose-free diet can help you avoid foods and beverages that contain lactose. What are tips for following this plan? Reading food labels Do not consume foods, beverages, vitamins, minerals, or medicines containing lactose. Read ingredient lists carefully. Look for the words "lactose-free" on labels. Meal planning Use alternatives to dairy milk and foods made with milk products. These include the following: Lactose-free milk. Soy milk with added calcium and vitamin D. Almond milk, coconut milk, rice milk, or other nondairy milk alternatives with added calcium and vitamin D. Note that a lot of these are low in protein. Soy products, such as soy yogurt, soy cheese, soy ice cream, and soy-based sour cream. Other nut milk products, such as almond yogurt, almond cheese, cashew yogurt, cashew cheese, cashew ice cream, coconut yogurt, and coconut ice cream. Medicines, vitamins, and supplements Use lactase enzyme drops or tablets as directed by your health care provider. Make sure you get enough calcium and vitamin D in your diet. A lactose-free eating plan can be lacking in these important nutrients. Take calcium and  vitamin D supplements as directed by your health care provider. Talk with your health care provider about supplements if you are not able to get enough calcium and vitamin D from food. What foods should I eat? Fruits All fresh, canned, frozen, or dried fruits and fruit juices that are not processed with lactose. Vegetables All fresh, frozen, and canned vegetables without cheese, cream, or butter sauces. Grains Any that are not made with dairy milk or dairy products. Meats and other proteins Any meat, fish, poultry, and other protein sources that are not  made with dairy milk or dairy products. Fats and oils Any that are not made with dairy milk or dairy products. Sweets and desserts Any that are not made with dairy milk or dairy products. Seasonings and condiments Any that are not made with dairy milk or dairy products. Calcium Calcium is found in many foods that contain lactose and is important for bone health. The amount of calcium you need depends on your age: Adults younger than 50 years: 1,000 mg of calcium a day. Adults older than 50 years: 1,200 mg of calcium a day. If you are not getting enough calcium, you may get it from other sources, including: Orange juice that has been fortified with calcium. This means that calcium has been added to the product. There are 300-350 mg of calcium in 1 cup (237 mL) of calcium-fortified orange juice. Soy milk fortified with calcium. There are 300-400 mg of calcium in 1 cup (237 mL) of calcium-fortified soy milk. Rice or almond milk fortified with calcium. There are 300 mg of calcium in 1 cup (237 mL) of calcium-fortified rice or almond milk. Breakfast cereals fortified with calcium. There are 100-1,000 mg of calcium in calcium-fortified breakfast cereals. Spinach, cooked. There are 145 mg of calcium in  cup (90 g) of cooked spinach. Edamame, cooked. There are 130 mg of calcium in  cup (47 g) of cooked edamame. Collard greens, cooked. There are 125 mg of calcium in  cup (85 g) of cooked collard greens. Kale, frozen or cooked. There are 90 mg of calcium in  cup (59 g) of cooked or frozen kale. Almonds. There are 95 mg of calcium in  cup (35 g) of almonds. Broccoli, cooked. There are 60 mg of calcium in 1 cup (156 g) of cooked broccoli. The items listed above may not be a complete list of foods and beverages you can eat. Contact a dietitian for more options. What foods should I avoid? Lactose is found in dairy milk and dairy products, such as: Yogurt. Cheese. Butter. Margarine. Sour  cream. Cream. Whipped toppings and creamers. Ice cream and other dairy-based desserts. Lactose is also found in foods or products made with dairy milk or milk ingredients. To find out whether a food contains dairy milk or a milk ingredient, look at the ingredients list. Avoid foods with the statement "May contain milk" and foods that contain: Milk powder. Whey. Curd. Lactose. Lactoglobulin. The items listed above may not be a complete list of foods and beverages to avoid. Contact a dietitian for more information. Where to find more information [employer] of Diabetes and Digestive and Kidney Diseases: [employer] Summary If you are lactose intolerant, it means that you are not able to digest lactose, a natural sugar found in milk and milk products. Following a lactose-free diet can help you manage this condition. Calcium is important for bone health and is found in many foods that contain lactose. Talk with your health care provider  about other sources of calcium. This information is not intended to replace advice given to you by your health care provider. Make sure you discuss any questions you have with your health care provider. Document Revised: 09/01/2020 Document Reviewed: 09/01/2020 Elsevier Patient Education  2022 [employer].   I appreciate the  opportunity to care for you  Thank You   Marsa Aris , MD

## 2021-08-09 NOTE — Progress Notes (Signed)
Regina Marsh    [id number]    1946/03/31  Primary Care Physician:Timberlake, Meridee Score, MD  Referring Physician: Shon Hale, MD [address] [place],  [place] [address]   Chief complaint:  Hiatal hernia, Abdominal pain, Lactose intolerance  HPI: 75 year old very pleasant female with complaints of epigastric abdominal pain.  She has history of hiatal hernia. She has been suffering with uncontrolled hypertension, blood pressure 210/96 in office this morning.  She did not take her blood pressure medications this morning.  She is followed by cardiology for management of persistent hypertension  She has intermittent chest discomfort, had cardiac work-up negative for acute angina.  She has CAD. She has epigastric abdominal discomfort worse sometimes after meals or when she consumes milk products Denies any melena or rectal bleeding.  No unintentional weight loss or decreased appetite.    Outpatient Encounter Medications as of 08/09/2021  Medication Sig   acetaminophen (TYLENOL) 325 MG tablet Take 650 mg by mouth every 6 (six) hours as needed.   Cholecalciferol (VITAMIN D3) 50 MCG (2000 UT) TABS Take 2 capsules by mouth daily.   diphenhydrAMINE HCl (BENADRYL ALLERGY PO) Take 0.5-1 tablets by mouth daily as needed.   famotidine (PEPCID) 20 MG tablet Take 1 tablet (20 mg total) by mouth 2 (two) times daily.   Garlic (GARLIQUE PO) Take by mouth.   hydrochlorothiazide (HYDRODIURIL) 25 MG tablet Take 25 mg by mouth daily.   Lactase (LACTAID PO) Take 1 tablet by mouth as directed.   Melatonin 500 MCG TBDP Take 1 tablet by mouth as needed.   Menatetrenone (VITAMIN K2) 100 MCG TABS Take 1 tablet by mouth daily.   OVER THE COUNTER MEDICATION Take 1,500 mg by mouth daily. Beet root   Propylene Glycol (SYSTANE BALANCE OP) Apply 1 drop to eye in the morning and at bedtime.   Simethicone (GAS-X PO) Take 1 tablet by mouth as needed.   thyroid (NP THYROID) 15 MG  tablet Take 15 mg by mouth daily.   gabapentin (NEURONTIN) 100 MG capsule Take 100 mg by mouth daily. (Patient not taking: Reported on 08/09/2021)   No facility-administered encounter medications on file as of 08/09/2021.    Allergies as of 08/09/2021 - Review Complete 08/09/2021  Allergen Reaction Noted   African mango [irvingia gabonensis]  03/27/2020   Allegra [fexofenadine] Other (See Comments) 12/14/2020   Amlodipine  01/21/2021   Atorvastatin Other (See Comments) 01/06/2021   Cetirizine hcl Other (See Comments) 01/06/2021   Chlorthalidone  01/21/2021   Lisinopril-hydrochlorothiazide  01/21/2021   Loratadine Other (See Comments) 01/06/2021   Penicillins Other (See Comments) 03/27/2020   Pravastatin Other (See Comments) 01/06/2021   Simvastatin Other (See Comments) 01/06/2021   Sulfa antibiotics Other (See Comments) 04/01/2021   Valsartan Other (See Comments) 01/21/2021   Spironolactone Rash 10/28/2020    Past Medical History:  Diagnosis Date   GERD (gastroesophageal reflux disease)    Hypertension    Hypothyroidism    Low back pain    Sjogren's disease (HCC)     Past Surgical History:  Procedure Laterality Date   ADENOIDECTOMY     BREAST LUMPECTOMY     CESAREAN SECTION     detached eye lid     LAPAROSCOPY     SALIVARY GLAND SURGERY     SINOSCOPY     TONSILLECTOMY AND ADENOIDECTOMY      Family History  Problem Relation Age of Onset   Hypertension Mother  Stroke Father    Cancer Father    Allergic rhinitis Neg Hx    Angioedema Neg Hx    Asthma Neg Hx    Atopy Neg Hx    Eczema Neg Hx    Immunodeficiency Neg Hx    Urticaria Neg Hx     Social History   Socioeconomic History   Marital status: Married    Spouse name: Not on file   Number of children: Not on file   Years of education: Not on file   Highest education level: Not on file  Occupational History   Not on file  Tobacco Use   Smoking status: Never   Smokeless tobacco: Never  Vaping Use    Vaping Use: Never used  Substance and Sexual Activity   Alcohol use: Not Currently   Drug use: Not Currently   Sexual activity: Not on file  Other Topics Concern   Not on file  Social History Narrative   Not on file   Social Determinants of Health   Financial Resource Strain: Not on file  Food Insecurity: Not on file  Transportation Needs: Not on file  Physical Activity: Not on file  Stress: Not on file  Social Connections: Not on file  Intimate Partner Violence: Not on file      Review of systems: All other review of systems negative except as mentioned in the HPI.   Physical Exam: Vitals:   08/09/21 0843  BP: (!) 210/96  Pulse: 80   Body mass index is 30.27 kg/m. Gen:      No acute distress HEENT:  sclera anicteric Abd:      soft, non-tender; no palpable masses, no distension Ext:    No edema Neuro: alert and oriented x 3 Psych: normal mood and affect  Data Reviewed:  Reviewed labs, radiology imaging, old records and pertinent past GI work up   Assessment and Plan/Recommendations:  75 year old very pleasant female with history of Sjogren's disease, uncontrolled hypertension with history of hiatal hernia with atypical chest pain, and epigastric abdominal pain  Will obtain upper GI series to evaluate hiatal hernia and any significant lesions in the upper GI tract With uncontrolled hypertension, she is at potential risk for complications associated with invasive endoscopic procedure and anesthesia, will hold off EGD unless she has any significant pathology on upper GI series Continue with dietary changes and exercise Continue lactose-free diet  Obtain abdominal ultrasound to exclude gallbladder disease  Continue Pepcid twice daily and antireflux measures Further recommendations based on findings on upper GI series and abdominal ultrasound  This visit required >60 minutes of patient care (this includes precharting, chart review, review of results,  face-to-face time used for counseling as well as treatment plan and follow-up. The patient was provided an opportunity to ask questions and all were answered. The patient agreed with the plan and demonstrated an understanding of the instructions.  Iona Beard , MD    CC: Shon Hale, *

## 2021-08-11 ENCOUNTER — Telehealth: Payer: Self-pay

## 2021-08-11 NOTE — Telephone Encounter (Signed)
Patient is asking if you were going to prescribe a different medication for the stomach acid. Thanks

## 2021-08-12 ENCOUNTER — Encounter: Payer: Self-pay | Admitting: Gastroenterology

## 2021-08-16 ENCOUNTER — Emergency Department (HOSPITAL_BASED_OUTPATIENT_CLINIC_OR_DEPARTMENT_OTHER): Payer: Medicare Other

## 2021-08-16 ENCOUNTER — Emergency Department (HOSPITAL_COMMUNITY): Payer: Medicare Other

## 2021-08-16 ENCOUNTER — Emergency Department (HOSPITAL_BASED_OUTPATIENT_CLINIC_OR_DEPARTMENT_OTHER)
Admission: EM | Admit: 2021-08-16 | Discharge: 2021-08-16 | Disposition: A | Discharge status: Home / Self Care | Payer: Medicare Other | Attending: Emergency Medicine | Admitting: Emergency Medicine

## 2021-08-16 ENCOUNTER — Other Ambulatory Visit: Payer: Self-pay

## 2021-08-16 ENCOUNTER — Encounter (HOSPITAL_BASED_OUTPATIENT_CLINIC_OR_DEPARTMENT_OTHER): Payer: Self-pay | Admitting: Emergency Medicine

## 2021-08-16 DIAGNOSIS — R200 Anesthesia of skin: Secondary | ICD-10-CM | POA: Diagnosis not present

## 2021-08-16 DIAGNOSIS — Z20822 Contact with and (suspected) exposure to covid-19: Secondary | ICD-10-CM | POA: Insufficient documentation

## 2021-08-16 DIAGNOSIS — E039 Hypothyroidism, unspecified: Secondary | ICD-10-CM | POA: Diagnosis not present

## 2021-08-16 DIAGNOSIS — G90522 Complex regional pain syndrome I of left lower limb: Secondary | ICD-10-CM | POA: Diagnosis not present

## 2021-08-16 DIAGNOSIS — M3500 Sicca syndrome, unspecified: Secondary | ICD-10-CM | POA: Diagnosis not present

## 2021-08-16 DIAGNOSIS — I6612 Occlusion and stenosis of left anterior cerebral artery: Secondary | ICD-10-CM | POA: Diagnosis not present

## 2021-08-16 DIAGNOSIS — R269 Unspecified abnormalities of gait and mobility: Secondary | ICD-10-CM | POA: Diagnosis not present

## 2021-08-16 DIAGNOSIS — Z79899 Other long term (current) drug therapy: Secondary | ICD-10-CM | POA: Insufficient documentation

## 2021-08-16 DIAGNOSIS — I951 Orthostatic hypotension: Secondary | ICD-10-CM | POA: Diagnosis not present

## 2021-08-16 DIAGNOSIS — I672 Cerebral atherosclerosis: Secondary | ICD-10-CM

## 2021-08-16 DIAGNOSIS — I6601 Occlusion and stenosis of right middle cerebral artery: Secondary | ICD-10-CM | POA: Diagnosis not present

## 2021-08-16 DIAGNOSIS — R2689 Other abnormalities of gait and mobility: Secondary | ICD-10-CM | POA: Diagnosis not present

## 2021-08-16 DIAGNOSIS — R42 Dizziness and giddiness: Secondary | ICD-10-CM

## 2021-08-16 DIAGNOSIS — I6622 Occlusion and stenosis of left posterior cerebral artery: Secondary | ICD-10-CM | POA: Diagnosis not present

## 2021-08-16 DIAGNOSIS — I6523 Occlusion and stenosis of bilateral carotid arteries: Secondary | ICD-10-CM | POA: Diagnosis not present

## 2021-08-16 DIAGNOSIS — I10 Essential (primary) hypertension: Secondary | ICD-10-CM | POA: Insufficient documentation

## 2021-08-16 DIAGNOSIS — I6503 Occlusion and stenosis of bilateral vertebral arteries: Secondary | ICD-10-CM | POA: Diagnosis not present

## 2021-08-16 DIAGNOSIS — G9389 Other specified disorders of brain: Secondary | ICD-10-CM | POA: Diagnosis not present

## 2021-08-16 DIAGNOSIS — E785 Hyperlipidemia, unspecified: Secondary | ICD-10-CM

## 2021-08-16 DIAGNOSIS — R531 Weakness: Secondary | ICD-10-CM | POA: Diagnosis not present

## 2021-08-16 LAB — COMPREHENSIVE METABOLIC PANEL
ALT: 16 U/L (ref 0–44)
AST: 16 U/L (ref 15–41)
Albumin: 4.4 g/dL (ref 3.5–5.0)
Alkaline Phosphatase: 55 U/L (ref 38–126)
Anion gap: 11 (ref 5–15)
BUN: 15 mg/dL (ref 8–23)
CO2: 29 mmol/L (ref 22–32)
Calcium: 9.7 mg/dL (ref 8.9–10.3)
Chloride: 101 mmol/L (ref 98–111)
Creatinine, Ser: 0.98 mg/dL (ref 0.44–1.00)
GFR, Estimated: 60 mL/min — ABNORMAL LOW (ref >60)
Glucose, Bld: 122 mg/dL — ABNORMAL HIGH (ref 70–99)
Potassium: 3.1 mmol/L — ABNORMAL LOW (ref 3.5–5.1)
Sodium: 141 mmol/L (ref 135–145)
Total Bilirubin: 1.2 mg/dL (ref 0.3–1.2)
Total Protein: 7.5 g/dL (ref 6.5–8.1)

## 2021-08-16 LAB — PROTIME-INR
INR: 1 (ref 0.8–1.2)
Prothrombin Time: 13.3 seconds (ref 11.4–15.2)

## 2021-08-16 LAB — CBC WITH DIFFERENTIAL/PLATELET
Abs Immature Granulocytes: 0.03 10*3/uL (ref 0.00–0.07)
Basophils Absolute: 0.1 10*3/uL (ref 0.0–0.1)
Basophils Relative: 1 %
Eosinophils Absolute: 0 10*3/uL (ref 0.0–0.5)
Eosinophils Relative: 0 %
HCT: 38.6 % (ref 36.0–46.0)
Hemoglobin: 13.2 g/dL (ref 12.0–15.0)
Immature Granulocytes: 0 %
Lymphocytes Relative: 19 %
Lymphs Abs: 1.5 10*3/uL (ref 0.7–4.0)
MCH: 30.2 pg (ref 26.0–34.0)
MCHC: 34.2 g/dL (ref 30.0–36.0)
MCV: 88.3 fL (ref 80.0–100.0)
Monocytes Absolute: 0.5 10*3/uL (ref 0.1–1.0)
Monocytes Relative: 6 %
Neutro Abs: 6 10*3/uL (ref 1.7–7.7)
Neutrophils Relative %: 74 %
Platelets: 241 10*3/uL (ref 150–400)
RBC: 4.37 MIL/uL (ref 3.87–5.11)
RDW: 12.7 % (ref 11.5–15.5)
WBC: 8.1 10*3/uL (ref 4.0–10.5)
nRBC: 0 % (ref 0.0–0.2)

## 2021-08-16 LAB — RESP PANEL BY RT-PCR (FLU A&B, COVID) ARPGX2
Influenza A by PCR: NEGATIVE
Influenza B by PCR: NEGATIVE
SARS Coronavirus 2 by RT PCR: NEGATIVE

## 2021-08-16 LAB — LIPASE, BLOOD: Lipase: 14 U/L (ref 11–51)

## 2021-08-16 LAB — TSH: TSH: 1.302 u[IU]/mL (ref 0.350–4.500)

## 2021-08-16 IMAGING — CT CT ANGIO HEAD-NECK (W OR W/O PERF)
2 of 11 series · 7 of 33 positions shown · IV contrast (APPLIED)
Comparison: None.

CLINICAL DATA: Neuro deficit, acute, stroke suspected; 2 days of
gait abnormality

EXAM:
CT ANGIOGRAPHY HEAD AND NECK
TECHNIQUE: Multidetector CT imaging of the head and neck was performed using
the standard protocol during bolus administration of intravenous
contrast. Multiplanar CT image reconstructions and MIPs were
obtained to evaluate the vascular anatomy. Carotid stenosis
measurements (when applicable) are obtained utilizing NASCET
criteria, using the distal internal carotid diameter as the
denominator.
CONTRAST:  75mL OMNIPAQUE IOHEXOL 350 MG/ML SOLN

[Series 9: cta head · axial · 0.46mm/px · z∈[+1166,+1278]mm · 2 of 170 slices shown]
[im 57/170  soft-tissue]
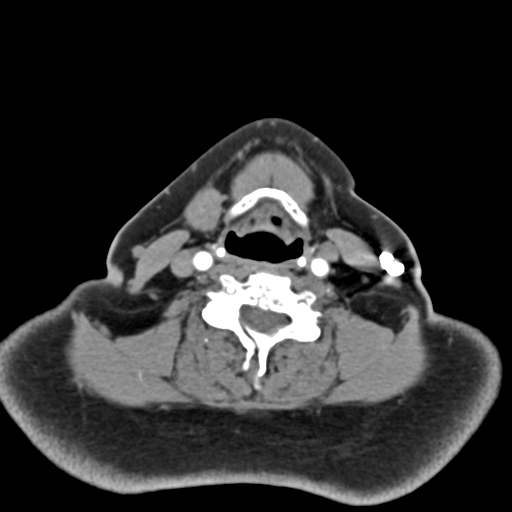
[im 113/170  soft-tissue]
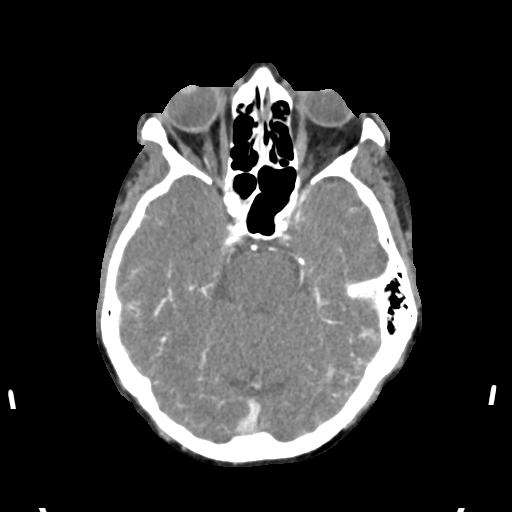

[Series 11: ax thin · axial · 0.51mm/px · z∈[+1112,+1340]mm · 5 of 343 slices shown]
[im 58/343  soft-tissue]
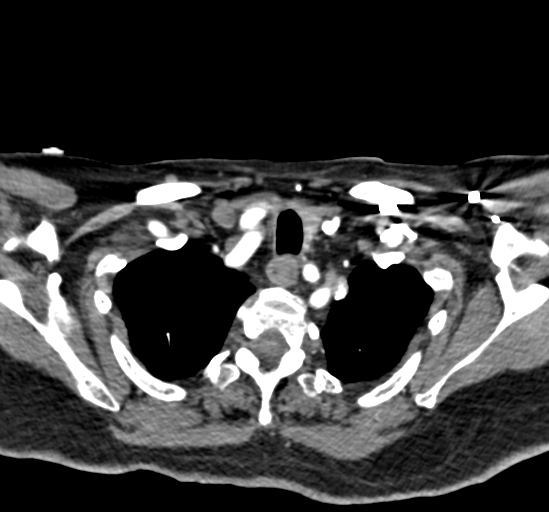
[im 115/343  bone]
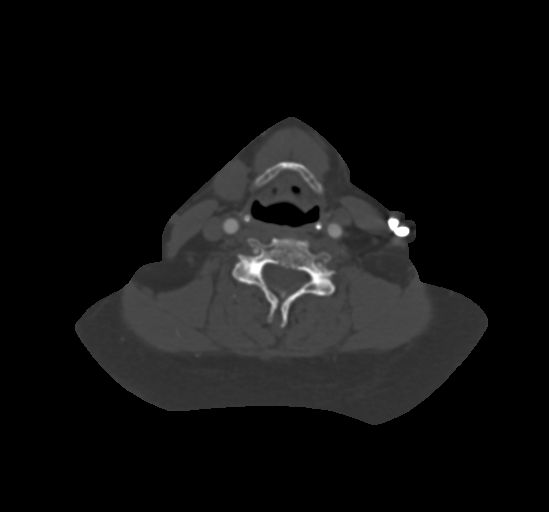
[im 172/343  soft-tissue]
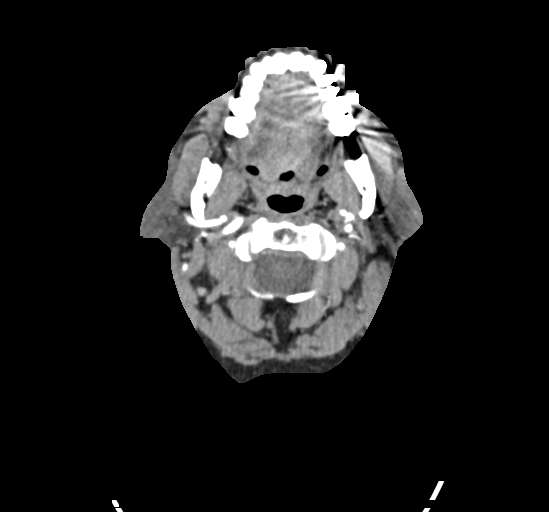
[im 229/343  bone]
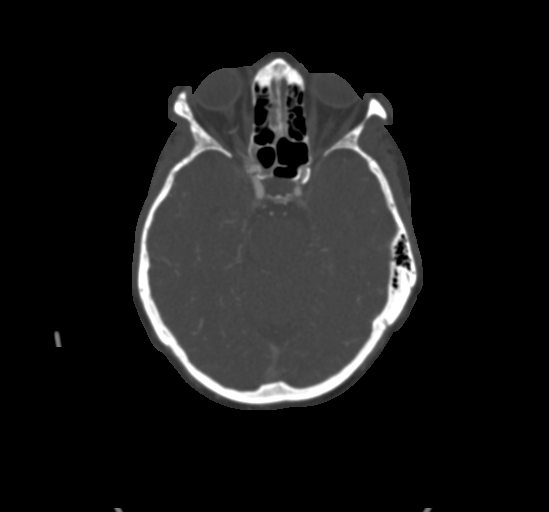
[im 286/343  soft-tissue]
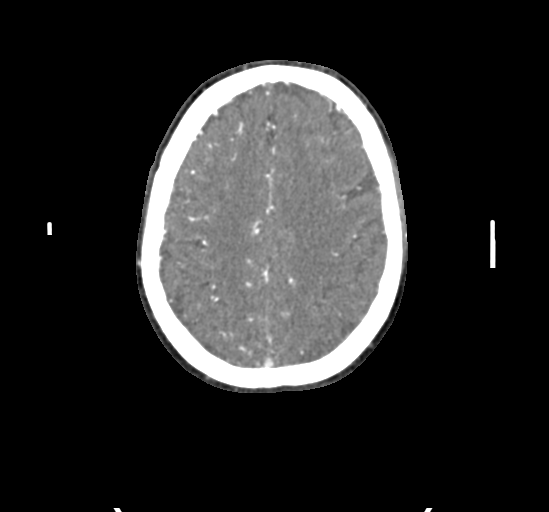

[7 of 33 positions shown; findings below may reference images not displayed]

FINDINGS: CT HEAD

Brain: There is no acute intracranial hemorrhage, mass effect, or
edema. Gray-white differentiation is preserved. There is no
extra-axial fluid collection. Ventricles and sulci are within normal
limits in size configuration. Patchy low-density in the
supratentorial white matter is nonspecific but may reflect chronic
microvascular ischemic changes. There is a chronic small vessel
infarct adjacent to the atrium of the left lateral ventricle.

Vascular: There is atherosclerotic calcification at the skull base.

Skull: Calvarium is unremarkable.

Sinuses/Orbits: No acute finding.

Other: None.

Review of the MIP images confirms the above findings

CTA NECK

Aortic arch: Great vessel origins are patent.

Right carotid system: Patent. Minimal calcified plaque along the
proximal internal carotid without stenosis.

Left carotid system: Patent. Eccentric noncalcified plaque along the
common carotid with less than 50% stenosis. Noncalcified plaque
along the proximal internal carotid with less than 50% stenosis.

Vertebral arteries: Patent and codominant.  No stenosis.

Skeleton: Degenerative changes of the cervical spine, greatest at
C5-C6.

Other neck: No acute abnormality. Fatty atrophy or resected left
submandibular gland.

Upper chest: No apical lung mass.

Review of the MIP images confirms the above findings

CTA HEAD

Suboptimal arterial evaluation due to venous enhancement.

Anterior circulation: Intracranial internal carotid arteries are
patent. There is noncalcified plaque along the petrous portion and
inferior genu with mild stenosis. Calcified plaque along the
cavernous segment with mild stenosis.

Occlusion of the right M1 MCA. There is reconstitution at the
bifurcation. Left middle cerebral artery is patent with what appears
to be an early bifurcation probable superimposed stenosis.

Anterior cerebral arteries are patent. Anterior communicating artery
is present. Suspect stenosis of the proximal left A1 ACA.

Posterior circulation: Intracranial vertebral arteries are patent.
Moderate stenosis on the right. Moderate to marked stenosis on the
left just beyond PICA origin. There is likely stenosis at the left
PICA origin. Basilar artery is patent with noncalcified plaque.
Posterior cerebral arteries are poorly visualized and likely
diffusely stenotic and irregular.

Venous sinuses: Patent as allowed by contrast bolus timing.

Review of the MIP images confirms the above findings
IMPRESSION: There is no acute intracranial hemorrhage or evidence of acute
infarction. ASPECT score is 10.

Age-indeterminate occlusion of the right M1 MCA with reconstitution
at the bifurcation.

Intracranial atherosclerosis. Moderate stenosis right vertebral
artery and moderate to marked stenosis left vertebral artery just
beyond PICA origin. There is stenosis at the left PICA origin. The
posterior cerebral arteries are poorly visualized and likely
diffusely irregular and stenotic. Probable early left MCA
bifurcation with superimposed stenosis. Stenosis of proximal left A1
ACA.

No hemodynamically significant stenosis in the neck. Noncalcified
plaque at the left ICA origin with less than 50% stenosis.

Initial results were called by telephone at the time of
interpretation on [DATE] at [DATE] to provider KOU
KOU , who verbally acknowledged these results.

## 2021-08-16 IMAGING — MR MR HEAD WO/W CM
14 of 16 series · 40 of 48 positions shown · IV contrast (gadavist)
Comparison: None.

CLINICAL DATA: Neuro deficit, acute, stroke suspected CT scan shows
M1 occlusion. Neurology recommend MRI with and without.

EXAM:
MRI HEAD WITHOUT AND WITH CONTRAST
TECHNIQUE: Multiplanar, multiecho pulse sequences of the brain and surrounding
structures were obtained without and with intravenous contrast.
CONTRAST:  7.5mL GADAVIST GADOBUTROL 1 MMOL/ML IV SOLN

[Series 5: DWI · axial · 3.0mm · 0.88mm/px · z∈[-98,+53]mm · 6 of 104 slices shown (1 of 4)]
[im 1/104]
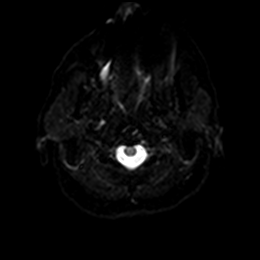
[im 21/104]
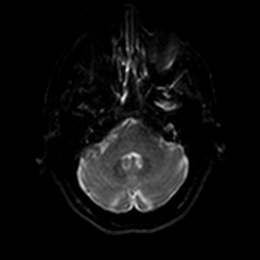
[im 42/104]
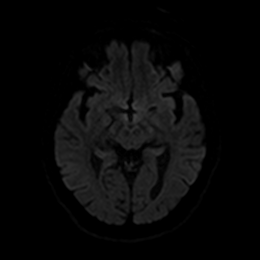
[im 62/104]
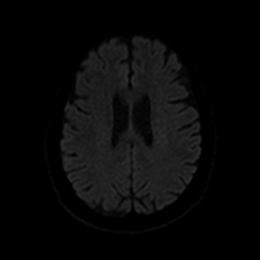
[im 83/104]
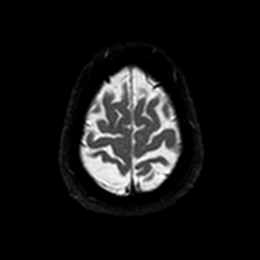
[im 104/104]
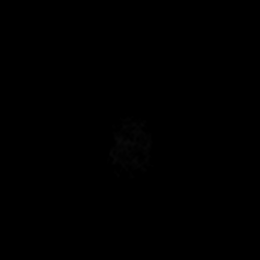

[Series 6: DWI · axial · 3.0mm · 0.88mm/px · z∈[-98,+53]mm · 3 of 52 slices shown (2 of 4)]
[im 1/52]
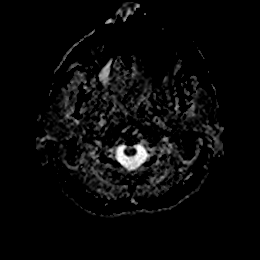
[im 26/52]
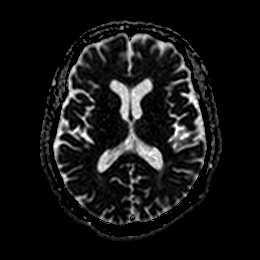
[im 52/52]
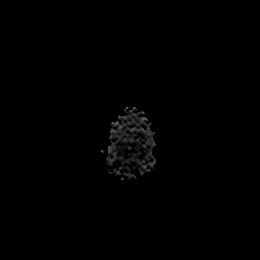

[Series 7: DWI · coronal · 4.0mm · 0.88mm/px · 4 of 64 slices shown (3 of 4)]
[im 1/64]
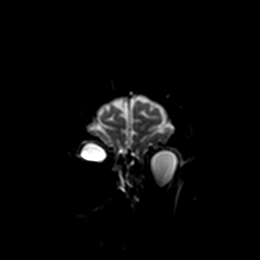
[im 22/64]
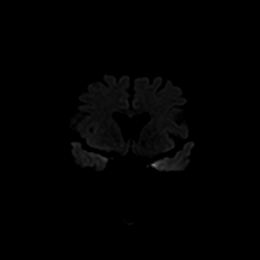
[im 43/64]
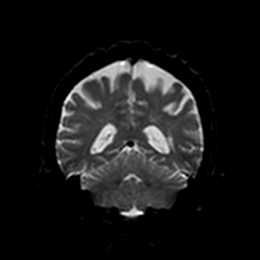
[im 64/64]
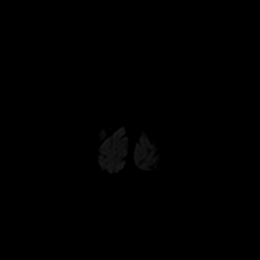

[Series 8: DWI · coronal · 4.0mm · 0.88mm/px · 2 of 32 slices shown (4 of 4)]
[im 1/32]
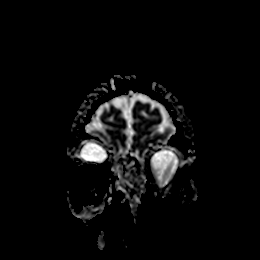
[im 32/32]
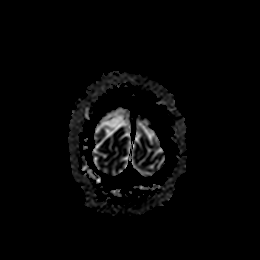

[Series 9: T1 · sagittal · 5.0mm · 0.75mm/px · 1 of 23 slices shown]
[im 1/23]
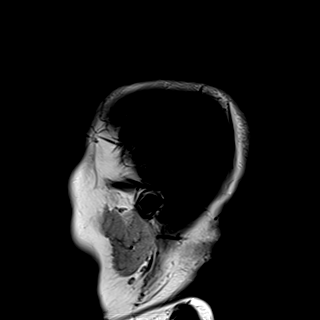

[Series 10: T2 · axial · 5.0mm · 0.72mm/px · z∈[-97,+52]mm · 2 of 26 slices shown]
[im 1/26]
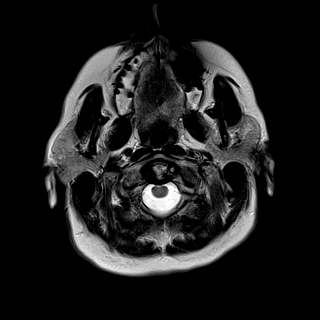
[im 26/26]
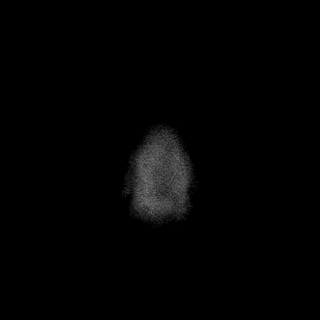

[Series 11: FLAIR · axial · 5.0mm · 0.45mm/px · z∈[-95,+53]mm · 2 of 26 slices shown]
[im 1/26]
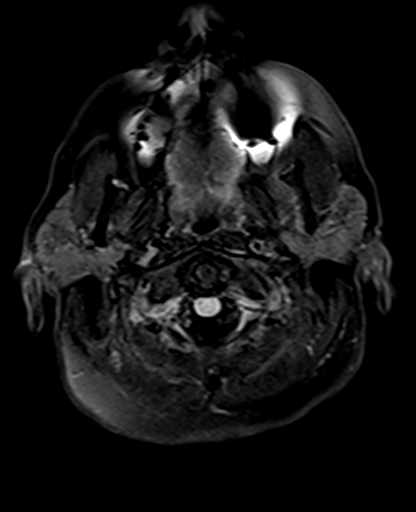
[im 26/26]
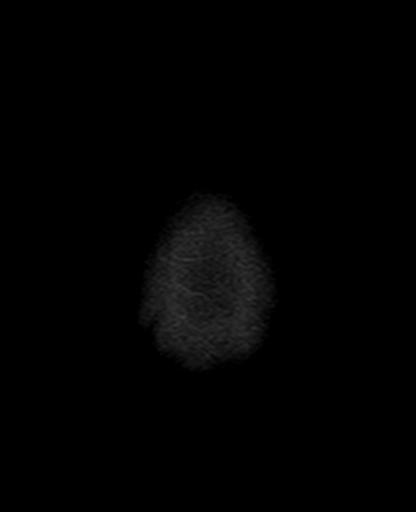

[Series 12: mag_images · axial · 3.0mm · 0.90mm/px · z∈[-108,+67]mm · 4 of 60 slices shown]
[im 1/60]
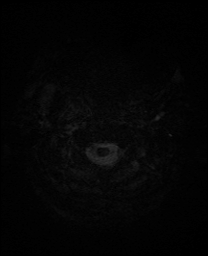
[im 20/60]
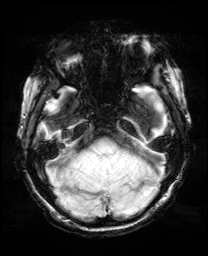
[im 40/60]
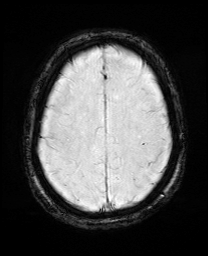
[im 60/60]
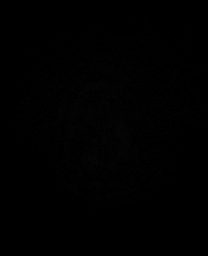

[Series 13: pha_images · axial · 3.0mm · 0.90mm/px · z∈[-108,+58]mm · 4 of 57 slices shown]
[im 1/57]
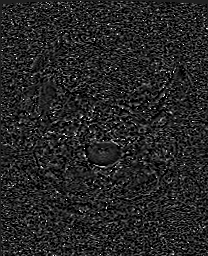
[im 19/57]
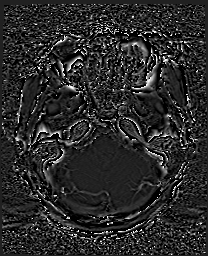
[im 38/57]
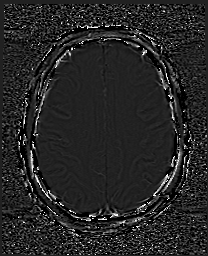
[im 57/57]
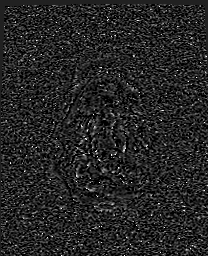

[Series 14: swi_images · axial · 3.0mm · 0.90mm/px · z∈[-108,+67]mm · 4 of 60 slices shown]
[im 1/60]
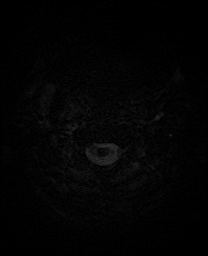
[im 20/60]
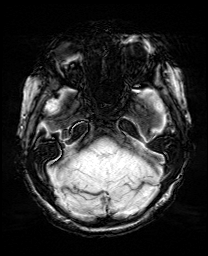
[im 40/60]
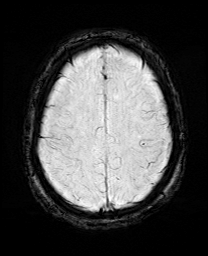
[im 60/60]
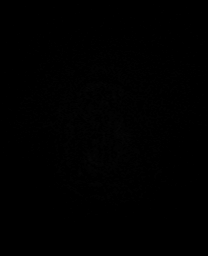

[Series 15: mip_images(sw) · axial · 24.0mm · 0.90mm/px · z∈[-98,+56]mm · 3 of 53 slices shown]
[im 1/53]
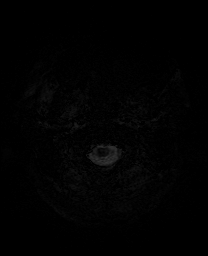
[im 27/53]
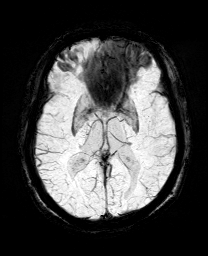
[im 53/53]
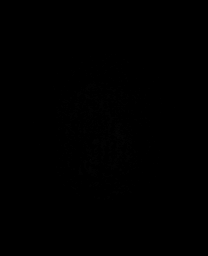

[Series 17: T2 post-contrast · coronal · 5.0mm · 0.72mm/px · 2 of 28 slices shown]
[im 1/28]
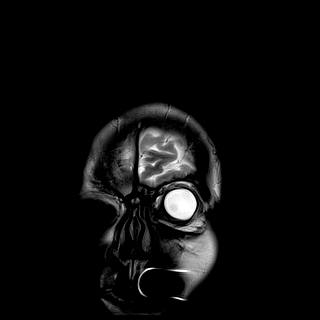
[im 28/28]
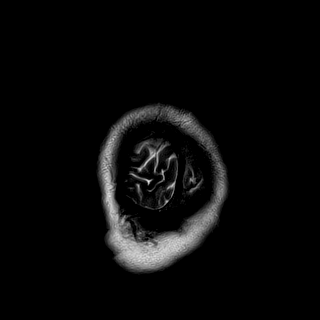

[Series 20: T1 post-contrast · sagittal · 5.0mm · 0.72mm/px · 1 of 23 slices shown (1 of 2)]
[im 1/23]
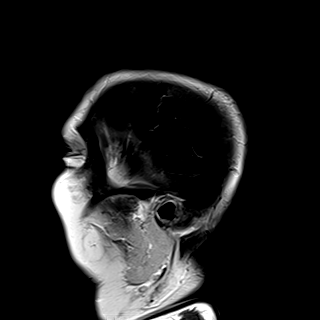

[Series 21: T1 post-contrast · coronal · 5.0mm · 0.34mm/px · 2 of 28 slices shown (2 of 2)]
[im 1/28]
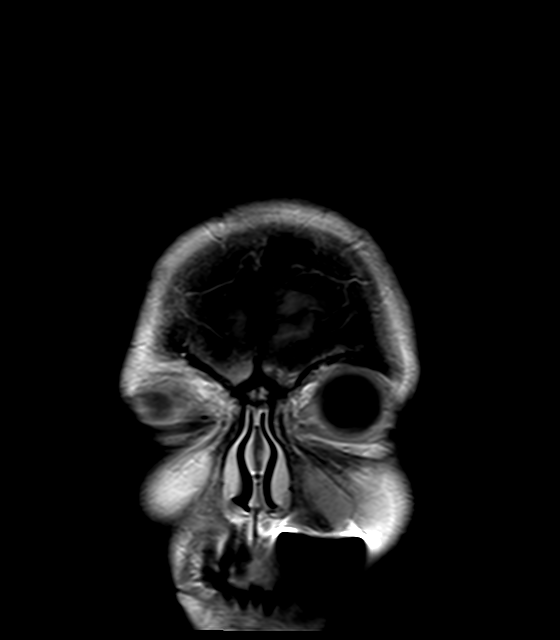
[im 28/28]
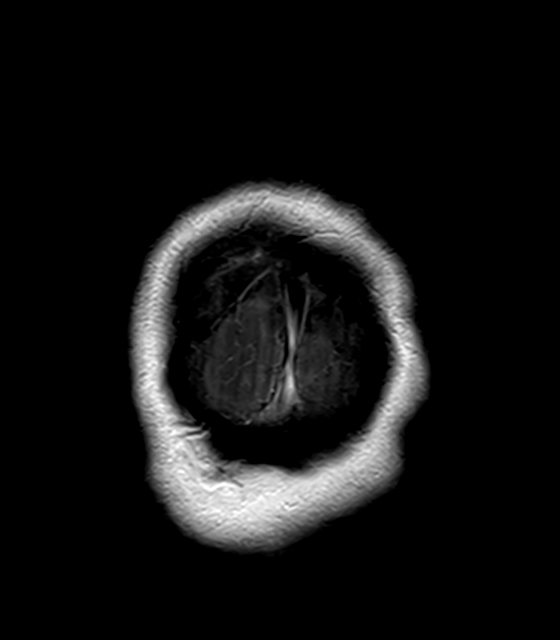

[40 of 48 positions shown; findings below may reference images not displayed]

FINDINGS: Brain: There is no acute infarction or intracranial hemorrhage.
There is no mass effect or edema. There is no hydrocephalus or
extra-axial fluid collection. Ventricles and sulci are normal in
size and configuration. A 7 mm dural-based lesion along the right
frontal convexity (series 18, image 35) and an 8 mm dural-based
lesion along the left frontoparietal convexity (series 18, image 46
are consistent with small meningiomas.

Vascular: Better evaluated on the CTA.

Skull and upper cervical spine: Normal marrow signal is preserved.

Sinuses/Orbits: Minor mucosal thickening.  Orbits are unremarkable.

Other: Sella is unremarkable.  Mastoid air cells are clear.
IMPRESSION: No acute infarction.

Two small meningiomas.

## 2021-08-16 MED ORDER — IOHEXOL 350 MG/ML SOLN
75.0000 mL | Freq: Once | INTRAVENOUS | Status: AC | PRN
  Administered 2021-08-16: 75 mL via INTRAVENOUS

## 2021-08-16 MED ORDER — LABETALOL HCL 5 MG/ML IV SOLN
10.0000 mg | Freq: Once | INTRAVENOUS | Status: AC
  Administered 2021-08-16: 10 mg via INTRAVENOUS
  Filled 2021-08-16: qty 4

## 2021-08-16 MED ORDER — CLOPIDOGREL BISULFATE 75 MG PO TABS
75.0000 mg | ORAL_TABLET | Freq: Once | ORAL | Status: AC
  Administered 2021-08-16: 75 mg via ORAL
  Filled 2021-08-16: qty 1

## 2021-08-16 MED ORDER — GADOBUTROL 1 MMOL/ML IV SOLN
7.5000 mL | Freq: Once | INTRAVENOUS | Status: AC | PRN
  Administered 2021-08-16: 7.5 mL via INTRAVENOUS

## 2021-08-16 MED ORDER — ASPIRIN 81 MG PO CHEW
324.0000 mg | CHEWABLE_TABLET | Freq: Once | ORAL | Status: AC
  Administered 2021-08-16: 324 mg via ORAL
  Filled 2021-08-16: qty 4

## 2021-08-16 MED ORDER — LORAZEPAM 2 MG/ML IJ SOLN
0.5000 mg | Freq: Once | INTRAMUSCULAR | Status: AC | PRN
  Administered 2021-08-16: 0.5 mg via INTRAVENOUS
  Filled 2021-08-16: qty 1

## 2021-08-16 NOTE — ED Notes (Signed)
Pt arrives via [facility]. Per [facility], pt c/o unsteady gait starting yesterday. NIH 0. CT head/neck completed at [facility], shows occlusion of R M1 MCA. Here for neuro consult. BP 230 systolic, permissive HTN. NSR 70s.

## 2021-08-16 NOTE — ED Provider Notes (Signed)
[facility] EMERGENCY DEPARTMENT Provider Note   CSN: [id number] Arrival date & time: 08/16/21  0750     History Chief Complaint  Patient presents with   Lightheadedness     Regina Marsh is a 75 y.o. female who presents the emergency department as a transfer from [facility] emergency department for evaluation of a suspected M1 occlusion.  Patient states that she awoke this morning with dizziness and gait ataxia but denies numbness, tingling, weakness of the upper or lower extremities.  Patient transferred to [facility] for MRI imaging.  HPI     Past Medical History:  Diagnosis Date   GERD (gastroesophageal reflux disease)    Hypertension    Hypothyroidism    Low back pain    Sjogren's disease [facility])     Patient Active Problem List   Diagnosis Date Noted   Chest discomfort 04/08/2021   Mixed hyperlipidemia 03/09/2021   Chest pain of uncertain etiology 99991111   Primary hypertension 01/07/2021   Rash and other nonspecific skin eruption 12/10/2020   Drug reaction 12/10/2020   Lactose intolerance 12/10/2020   Endocrine disorder, unspecified 11/27/2020   Sjogren's syndrome ([employer]) 06/23/2020   Essential hypertension, benign 06/23/2020   Episodic lightheadedness 06/23/2020    Past Surgical History:  Procedure Laterality Date   BREAST LUMPECTOMY Bilateral    left-1966, right-2001   CESAREAN SECTION  1984   detached eye lid Left 2011   LAPAROSCOPY     x 2, for adhesions, 1989 and [place]  2006   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     Family History  Problem Relation Age of Onset   Hypertension Mother    Heart attack Mother    Stroke Father    Leukemia Father    Allergic rhinitis Neg Hx    Angioedema Neg Hx    Asthma Neg Hx    Atopy Neg Hx    Eczema Neg Hx    Immunodeficiency Neg Hx    Urticaria Neg  Hx     Social History   Tobacco Use   Smoking status: Never   Smokeless tobacco: Never  Vaping Use   Vaping Use: Never used  Substance Use Topics   Alcohol use: Not Currently   Drug use: Not Currently    Home Medications Prior to Admission medications   Medication Sig Start Date End Date Taking? Authorizing Provider  acetaminophen (TYLENOL) 325 MG tablet Take 650 mg by mouth every 6 (six) hours as needed.    [provider]  Cholecalciferol (VITAMIN D3) 50 MCG (2000 UT) TABS Take 2 capsules by mouth daily.    [provider]  diphenhydrAMINE HCl (BENADRYL ALLERGY PO) Take 0.5-1 tablets by mouth daily as needed.    [provider]  famotidine (PEPCID) 20 MG tablet Take 1 tablet (20 mg total) by mouth 2 (two) times daily. 01/07/21   Garnet Sierras, DO  gabapentin (NEURONTIN) 100 MG capsule Take 100 mg by mouth daily. Patient not taking: Reported on 08/09/2021 04/01/21   [provider]  Garlic (GARLIQUE PO) Take by mouth.    [provider]  hydrochlorothiazide (HYDRODIURIL) 25 MG tablet Take 25 mg by mouth daily.    [provider]  Lactase (LACTAID PO) Take 1 tablet by mouth as directed.    [provider]  Melatonin 500 MCG TBDP Take 1 tablet by mouth as needed.    [provider]  Menatetrenone (VITAMIN K2) 100 MCG TABS Take 1 tablet by mouth daily.    [provider]  OVER THE COUNTER MEDICATION Take 1,500 mg by mouth daily. Beet root    [provider]  Propylene Glycol (SYSTANE BALANCE OP) Apply 1 drop to eye in the morning and at bedtime.    [provider]  Simethicone (GAS-X PO) Take 1 tablet by mouth as needed.    [provider]  thyroid (NP THYROID) 15 MG tablet Take 15 mg by mouth daily.    [provider]    Allergies    African mango [irvingia gabonensis], Allegra [fexofenadine], Amlodipine, Atorvastatin, Cetirizine hcl, Chlorthalidone,  Lisinopril-hydrochlorothiazide, Loratadine, Penicillins, Pravastatin, Simvastatin, Sulfa antibiotics, Valsartan, and Spironolactone  Review of Systems   Review of Systems  Constitutional:  Negative for chills and fever.  HENT:  Negative for ear pain and sore throat.   Eyes:  Negative for pain and visual disturbance.  Respiratory:  Negative for cough and shortness of breath.   Cardiovascular:  Negative for chest pain and palpitations.  Gastrointestinal:  Negative for abdominal pain and vomiting.  Genitourinary:  Negative for dysuria and hematuria.  Musculoskeletal:  Negative for arthralgias and back pain.  Skin:  Negative for color change and rash.  Neurological:  Positive for dizziness. Negative for seizures and syncope.  All other systems reviewed and are negative.  Physical Exam Updated Vital Signs BP (!) 232/88   Pulse 70   Temp 98.1 F (36.7 C) (Oral)   Resp (!) 22   SpO2 100%   Physical Exam Vitals and nursing note reviewed.  Constitutional:      General: She is not in acute distress.    Appearance: She is well-developed.  HENT:     Head: Normocephalic and atraumatic.  Eyes:     Conjunctiva/sclera: Conjunctivae normal.  Cardiovascular:     Rate and Rhythm: Normal rate and regular rhythm.     Heart sounds: No murmur heard. Pulmonary:     Effort: Pulmonary effort is normal. No respiratory distress.     Breath sounds: Normal breath sounds.  Abdominal:     Palpations: Abdomen is soft.     Tenderness: There is no abdominal tenderness.  Musculoskeletal:     Cervical back: Neck supple.  Skin:    General: Skin is warm and dry.  Neurological:     Mental Status: She is alert and oriented to person, place, and time.     Cranial Nerves: No cranial nerve deficit.     Sensory: No sensory deficit.     Motor: No weakness.    ED Results / Procedures / Treatments   Labs (all labs ordered are listed, but only abnormal results are displayed) Labs Reviewed  COMPREHENSIVE  METABOLIC PANEL - Abnormal; Notable for the following components:      Result Value   Potassium 3.1 (*)    Glucose, Bld 122 (*)    GFR, Estimated 60 (*)    All other components within normal limits  RESP PANEL BY RT-PCR (FLU A&B, COVID) ARPGX2  CBC WITH DIFFERENTIAL/PLATELET  LIPASE, BLOOD  PROTIME-INR  TSH    EKG EKG Interpretation  Date/Time:  Monday August 16 2021 08:14:59 EST Ventricular Rate:  72 PR Interval:  210 QRS Duration: 98 QT Interval:  388 QTC Calculation: 425 R Axis:   47 Text Interpretation: Sinus rhythm Borderline  repolarization abnormality When compared to prior, similar appearance. No STEMI Confirmed by Antony Blackbird [phone]) on 08/16/2021 8:38:03 AM  Radiology CT ANGIO HEAD NECK W WO CM  Result Date: 08/16/2021 CLINICAL DATA:  Neuro deficit, acute, stroke suspected; 2 days of gait abnormality EXAM: CT ANGIOGRAPHY HEAD AND NECK TECHNIQUE: Multidetector CT imaging of the head and neck was performed using the standard protocol during bolus administration of intravenous contrast. Multiplanar CT image reconstructions and MIPs were obtained to evaluate the vascular anatomy. Carotid stenosis measurements (when applicable) are obtained utilizing NASCET criteria, using the distal internal carotid diameter as the denominator. CONTRAST:  20mL OMNIPAQUE IOHEXOL 350 MG/ML SOLN COMPARISON:  None. FINDINGS: CT HEAD Brain: There is no acute intracranial hemorrhage, mass effect, or edema. Gray-white differentiation is preserved. There is no extra-axial fluid collection. Ventricles and sulci are within normal limits in size configuration. Patchy low-density in the supratentorial white matter is nonspecific but may reflect chronic microvascular ischemic changes. 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 Vascular: There is atherosclerotic calcification at the skull base. Skull: Calvarium is unremarkable. Sinuses/Orbits: No acute  finding. Other: None. Review of the MIP images confirms the above findings CTA NECK Aortic arch: Great vessel origins are patent. Right carotid system: Patent. Minimal calcified plaque along the proximal internal carotid without stenosis. Left carotid system: Patent. Eccentric noncalcified plaque along the common carotid with less than 50% stenosis. Noncalcified plaque along the proximal internal carotid with less than 50% stenosis. Vertebral arteries: Patent and codominant.  No stenosis. Skeleton: Degenerative changes of the cervical spine, greatest at C5-C6. Other neck: No acute abnormality. Fatty atrophy or resected left submandibular gland. Upper chest: No apical lung mass. Review of the MIP images confirms the above findings CTA HEAD Suboptimal arterial evaluation due to venous enhancement. Anterior circulation: Intracranial internal carotid arteries are patent. There is noncalcified plaque along the petrous portion and inferior genu with mild stenosis. Calcified plaque along the cavernous segment with mild stenosis. Occlusion of the right M1 MCA. There is reconstitution at the bifurcation. Left middle cerebral artery is patent with what appears to be an early bifurcation probable superimposed stenosis. Anterior cerebral arteries are patent. Anterior communicating artery is present. Suspect stenosis of the proximal left A1 ACA. Posterior circulation: Intracranial vertebral arteries are patent. Moderate stenosis on the right. Moderate to marked stenosis on the left just beyond PICA origin. There is likely stenosis at the left PICA origin. Basilar artery is patent with noncalcified plaque. Posterior cerebral arteries are poorly visualized and likely diffusely stenotic and irregular. Venous sinuses: Patent as allowed by contrast bolus timing. Review of the MIP images confirms the above findings IMPRESSION: There is no acute intracranial hemorrhage or evidence of acute infarction. ASPECT score is 10.  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 Intracranial atherosclerosis. Moderate stenosis right vertebral artery and moderate to marked stenosis left vertebral artery just beyond PICA origin. There is stenosis at the left PICA origin. The posterior cerebral arteries are poorly visualized and likely diffusely irregular and stenotic. Probable early left MCA bifurcation with superimposed stenosis. Stenosis of proximal left A1 ACA. No hemodynamically significant stenosis in the neck. Noncalcified plaque at the left ICA origin with less than 50% stenosis. Initial results were called by telephone at the time of interpretation on 08/16/2021 at 10:32 am to provider [facility] , who verbally acknowledged these results. Electronically Signed   By: Addison Lank.D.  On: 08/16/2021 11:02    Procedures Procedures   Medications Ordered in ED Medications  labetalol (NORMODYNE) injection 10 mg (10 mg Intravenous Given 08/16/21 0919)  iohexol (OMNIPAQUE) 350 MG/ML injection 75 mL (75 mLs Intravenous Contrast Given 08/16/21 1009)  aspirin chewable tablet 324 mg (324 mg Oral Given 08/16/21 1106)  clopidogrel (PLAVIX) tablet 75 mg (75 mg Oral Given 08/16/21 1106)    ED Course  I have reviewed the triage vital signs and the nursing notes.  Pertinent labs & imaging results that were available during my care of the patient were reviewed by me and considered in my medical decision making (see chart for details).    MDM Rules/Calculators/A&P                           Patient seen emergency department for evaluation of ataxia and a abnormal CTA.  Physical exam reveals no focal motor or sensory deficits, mild ataxia with toe to heel walking but is otherwise unremarkable.  Follow-up MRI with and without contrast unremarkable outside of small meningiomas.  Neurology evaluated the patient and agrees that the patient is safe for outpatient neurologic follow-up and does  not require inpatient admission at this time.  Patient did have significantly elevated blood pressures but she has not taken her home blood pressure medications today.  She states that this has been a chronic issue and as aggressive blood pressure control in the emergency department in the absence of symptoms has been shown to worsen patient clinical outcomes, she will be discharged with instructions to take her home blood pressure medications at this time.  Patient then discharged with outpatient neurology follow-up. Final Clinical Impression(s) / ED Diagnoses Final diagnoses:  Gait abnormality    Rx / DC Orders ED Discharge Orders     None        Shanedra Lave, MD 08/16/21 1556

## 2021-08-16 NOTE — ED Notes (Signed)
Got patient undressed on the monitor patient is resting with call bell in reach 

## 2021-08-16 NOTE — ED Provider Notes (Signed)
[facility] EMERGENCY DEPT Provider Note   CSN: [id number] Arrival date & time: 08/16/21  0750     History Chief Complaint  Patient presents with   Lightheadedness     Regina Marsh is a 75 y.o. female.  The history is provided by the patient, the spouse and medical records. No language interpreter was used.  Neurologic Problem This is a new problem. The current episode started yesterday. The problem occurs constantly. The problem has not changed since onset.Pertinent negatives include no chest pain, no abdominal pain (previous but resolved), no headaches and no shortness of breath. The symptoms are aggravated by walking. Nothing relieves the symptoms. She has tried nothing for the symptoms. The treatment provided no relief.      Past Medical History:  Diagnosis Date   GERD (gastroesophageal reflux disease)    Hypertension    Hypothyroidism    Low back pain    Sjogren's disease [facility])     Patient Active Problem List   Diagnosis Date Noted   Chest discomfort 04/08/2021   Mixed hyperlipidemia 03/09/2021   Chest pain of uncertain etiology 03/09/2021   Primary hypertension 01/07/2021   Rash and other nonspecific skin eruption 12/10/2020   Drug reaction 12/10/2020   Lactose intolerance 12/10/2020   Endocrine disorder, unspecified 11/27/2020   Sjogren's syndrome (HCC) 06/23/2020   Essential hypertension, benign 06/23/2020   Episodic lightheadedness 06/23/2020    Past Surgical History:  Procedure Laterality Date   BREAST LUMPECTOMY Bilateral    left-1966, right-2001   CESAREAN SECTION  1984   detached eye lid Left 2011   LAPAROSCOPY     x 2, for adhesions, 1989 and 1990   NASAL SINUS SURGERY  2006   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     Family History  Problem Relation Age of Onset   Hypertension Mother    Heart attack Mother     Stroke Father    Leukemia Father    Allergic rhinitis Neg Hx    Angioedema Neg Hx    Asthma Neg Hx    Atopy Neg Hx    Eczema Neg Hx    Immunodeficiency Neg Hx    Urticaria Neg Hx     Social History   Tobacco Use   Smoking status: Never   Smokeless tobacco: Never  Vaping Use   Vaping Use: Never used  Substance Use Topics   Alcohol use: Not Currently   Drug use: Not Currently    Home Medications Prior to Admission medications   Medication Sig Start Date End Date Taking? Authorizing Provider  acetaminophen (TYLENOL) 325 MG tablet Take 650 mg by mouth every 6 (six) hours as needed.    [provider]  Cholecalciferol (VITAMIN D3) 50 MCG (2000 UT) TABS Take 2 capsules by mouth daily.    [provider]  diphenhydrAMINE HCl (BENADRYL ALLERGY PO) Take 0.5-1 tablets by mouth daily as needed.    [provider]  famotidine (PEPCID) 20 MG tablet Take 1 tablet (20 mg total) by mouth 2 (two) times daily. 01/07/21   Ellamae Sia, DO  gabapentin (NEURONTIN) 100 MG capsule Take 100 mg by mouth daily. Patient not taking: Reported on 08/09/2021 04/01/21   [provider]  Garlic (GARLIQUE PO) Take by mouth.    [provider]  hydrochlorothiazide (HYDRODIURIL) 25 MG tablet  Take 25 mg by mouth daily.    [provider]  Lactase (LACTAID PO) Take 1 tablet by mouth as directed.    [provider]  Melatonin 500 MCG TBDP Take 1 tablet by mouth as needed.    [provider]  Menatetrenone (VITAMIN K2) 100 MCG TABS Take 1 tablet by mouth daily.    [provider]  OVER THE COUNTER MEDICATION Take 1,500 mg by mouth daily. Beet root    [provider]  Propylene Glycol (SYSTANE BALANCE OP) Apply 1 drop to eye in the morning and at bedtime.    [provider]  Simethicone (GAS-X PO) Take 1 tablet by mouth as needed.    [provider]  thyroid (NP THYROID) 15 MG tablet Take 15 mg by mouth daily.     [provider]    Allergies    African mango [irvingia gabonensis], Allegra [fexofenadine], Amlodipine, Atorvastatin, Cetirizine hcl, Chlorthalidone, Lisinopril-hydrochlorothiazide, Loratadine, Penicillins, Pravastatin, Simvastatin, Sulfa antibiotics, Valsartan, and Spironolactone  Review of Systems   Review of Systems  Constitutional:  Negative for chills, diaphoresis, fatigue and fever.  HENT:  Negative for congestion.   Eyes:  Negative for visual disturbance.  Respiratory:  Negative for cough, chest tightness and shortness of breath.   Cardiovascular:  Negative for chest pain.  Gastrointestinal:  Negative for abdominal pain (previous but resolved), constipation, diarrhea, nausea (not now but recent) and vomiting.  Genitourinary:  Negative for dysuria, flank pain and frequency.  Musculoskeletal:  Positive for gait problem. Negative for back pain, neck pain and neck stiffness.  Skin:  Negative for rash and wound.  Neurological:  Positive for dizziness (not true dizziness but more gait and coordiantoin problem). Negative for seizures, syncope, speech difficulty, weakness, light-headedness, numbness and headaches.  All other systems reviewed and are negative.  Physical Exam Updated Vital Signs BP (!) 230/97 (BP Location: Right Arm)   Pulse 80   Temp 98 F (36.7 C) (Oral)   Resp 18   SpO2 99%   Physical Exam Vitals and nursing note reviewed.  Constitutional:      General: She is not in acute distress.    Appearance: She is well-developed. She is not ill-appearing, toxic-appearing or diaphoretic.  HENT:     Head: Normocephalic and atraumatic.     Nose: No congestion or rhinorrhea.     Mouth/Throat:     Mouth: Mucous membranes are moist.  Eyes:     Conjunctiva/sclera: Conjunctivae normal.     Pupils: Pupils are equal, round, and reactive to light.  Cardiovascular:     Rate and Rhythm: Normal rate and regular rhythm.     Heart sounds: No murmur heard. Pulmonary:      Effort: Pulmonary effort is normal. No respiratory distress.     Breath sounds: Normal breath sounds. No wheezing, rhonchi or rales.  Chest:     Chest wall: No tenderness.  Abdominal:     General: Abdomen is flat.     Palpations: Abdomen is soft.     Tenderness: There is no abdominal tenderness. There is no guarding or rebound.  Musculoskeletal:        General: No tenderness.     Cervical back: Neck supple.     Right lower leg: No edema.     Left lower leg: No edema.  Skin:    General: Skin is warm and dry.     Findings: No erythema or rash.  Neurological:     Mental Status: She  is alert.     Cranial Nerves: No cranial nerve deficit.     Sensory: No sensory deficit.     Motor: No weakness.     Gait: Gait abnormal.  Psychiatric:        Mood and Affect: Mood normal.    ED Results / Procedures / Treatments   Labs (all labs ordered are listed, but only abnormal results are displayed) Labs Reviewed  COMPREHENSIVE METABOLIC PANEL - Abnormal; Notable for the following components:      Result Value   Potassium 3.1 (*)    Glucose, Bld 122 (*)    GFR, Estimated 60 (*)    All other components within normal limits  RESP PANEL BY RT-PCR (FLU A&B, COVID) ARPGX2  CBC WITH DIFFERENTIAL/PLATELET  LIPASE, BLOOD  PROTIME-INR  TSH    EKG EKG Interpretation  Date/Time:  Monday August 16 2021 08:14:59 EST Ventricular Rate:  72 PR Interval:  210 QRS Duration: 98 QT Interval:  388 QTC Calculation: 425 R Axis:   47 Text Interpretation: Sinus rhythm Borderline repolarization abnormality When compared to prior, similar appearance. No STEMI Confirmed by Antony Blackbird [phone]) on 08/16/2021 8:38:03 AM  Radiology No results found.  Procedures Procedures   CRITICAL CARE Performed by: Gwenyth Allegra Camyra Vaeth Total critical care time: 35 minutes Critical care time was exclusive of separately billable procedures and treating other patients. Critical care was necessary to treat or  prevent imminent or life-threatening deterioration. Critical care was time spent personally by me on the following activities: development of treatment plan with patient and/or surrogate as well as nursing, discussions with consultants, evaluation of patient's response to treatment, examination of patient, obtaining history from patient or surrogate, ordering and performing treatments and interventions, ordering and review of laboratory studies, ordering and review of radiographic studies, pulse oximetry and re-evaluation of patient's condition.  Medications Ordered in ED Medications  aspirin chewable tablet 324 mg (has no administration in time range)  clopidogrel (PLAVIX) tablet 75 mg (has no administration in time range)  labetalol (NORMODYNE) injection 10 mg (10 mg Intravenous Given 08/16/21 0919)  iohexol (OMNIPAQUE) 350 MG/ML injection 75 mL (75 mLs Intravenous Contrast Given 08/16/21 1009)    ED Course  I have reviewed the triage vital signs and the nursing notes.  Pertinent labs & imaging results that were available during my care of the patient were reviewed by me and considered in my medical decision making (see chart for details).    MDM Rules/Calculators/A&P                           Shaine Merle is a 75 y.o. female with a past medical history significant for Sjogren's, hypertension, GERD, hypothyroidism, hyperlipidemia, who presents with 2 days of difficulty with ambulation and elevated blood pressures.  According to patient, months ago she had an episode where she could not walk well for a brief time but that resolved.  She says that since she woke up yesterday, she has been unable to stand without assistance as she keeps trying to jerk and fall over.  She does not think it goes to 1 side or the other but is unable to safely walk by herself as she did several days ago.  She reports her blood pressures have been higher than normal despite taking medications.  She does report she is  currently in the process of getting an outpatient work-up for nausea and vomiting with eating leading to abdominal discomfort  but is denying any nausea, vomiting or abdominal pain at this time.  She reports no recent headache or neck pain and denies any vision changes.  Denies speech difficulties.  Denies any fevers or chills.  Denies any focal numbness or weakness in extremities just that she is having difficulty with ambulation.  She denies any trauma or other complaints.  On exam, lungs are clear and chest is nontender.  Abdomen is nontender.  Normal bowel sounds.  Patient has normal sensation and strength in extremities and normal both finger-nose-finger testing and heel shin testing.  Pupils are symmetric and reactive with normal extraocular movements.  No carotid bruit appreciated that she had no nuchal rigidity.  When I try to ambulate her, after several steps she jerked and nearly fell over.  This also occurred when she was going back to the bed.   On repeat blood pressure evaluation her blood pressure is still over [id number] systolic.  She reports that all of his blood pressure trouble has been over the last year and a half.  Due to the neurological plaints and elevated blood pressures, will get CT of the head and neck as well as basic labs.  Will speak to neurology to discuss if she will likely need MRI given the persistent gait abnormality.  Unclear if this is hypertensive emergency versus a mild press versus some sort of cerebellar stroke.  Will give some labetalol initially to try to help her initial blood pressure.  Anticipate reassessment after work-up to determine disposition.  9:03 AM Just poke with Dr. Quinn Axe who agrees it is reasonable to give some labetalol to get her blood pressure under slight control but did not want to drop it too significantly if there is a stroke going on.  Dr. Quinn Axe agrees that if blood pressure is starting to improve and she is still having symptoms and the work-up  does not show anything else needing admission, she will need MRI with and without of the brain to further evaluate and will need ED to ED transfer today.  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  As her symptoms began when she woke up yesterday, I do not feel she is in any window for intravascular intervention or tPA and I spoke to Dr. Quinn Axe with neurology who agrees.  Dr. Quinn Axe does feel she needs to start a dose of aspirin and Plavix and needs ED to ED transfer for MRI with and without contrast of the brain and then call neurology after that is completed to discuss disposition and admission.  10:58 AM Spoke with Dr. Teressa Lower who accepts for ED to ED transfer.  After MRI, anticipate discussion with neurology for disposition.  Aspirin and Plavix given per neurology recommendation.   Final Clinical Impression(s) / ED Diagnoses Final diagnoses:  Gait abnormality    Clinical Impression: 1. Gait abnormality     Disposition: ED to ED transfer for MRI brain with and without followed by neuro consultation and likely admission for gait abnormality since yesterday with abnormality on CTA concerning for M1 occlusion.  This note was prepared with assistance of Systems analyst. Occasional wrong-word or sound-a-like substitutions may have occurred due to the inherent limitations of voice recognition software.     Johnanthony Wilden, Gwenyth Allegra, MD 08/16/21 [phone]

## 2021-08-16 NOTE — ED Notes (Signed)
RN reviewed discharge instructions. Follow up reviewed w/ pt and family, pt had no further questions.

## 2021-08-16 NOTE — ED Notes (Signed)
Patient transported to MRI 

## 2021-08-16 NOTE — ED Notes (Signed)
[email or web address] called for ed to ed 11:00

## 2021-08-16 NOTE — ED Notes (Signed)
Neurology at bedside.

## 2021-08-16 NOTE — ED Triage Notes (Signed)
Pt arrives to ED with c/o of lightheadedness and abnormal gait. Pt reports that she woke up yesterday morning lightheaded and dizzy. She notice that her gait was off slightly. She took her BP at home this morning which read 213 systolic.

## 2021-08-17 ENCOUNTER — Encounter: Payer: Self-pay | Admitting: Neurology

## 2021-08-17 ENCOUNTER — Other Ambulatory Visit: Payer: Self-pay | Admitting: Neurology

## 2021-08-17 DIAGNOSIS — M3500 Sicca syndrome, unspecified: Secondary | ICD-10-CM

## 2021-08-17 DIAGNOSIS — I951 Orthostatic hypotension: Secondary | ICD-10-CM

## 2021-08-17 MED ORDER — PANTOPRAZOLE SODIUM 40 MG PO TBEC: 40.0000 mg | DELAYED_RELEASE_TABLET | Freq: Every day | ORAL | 3 refills | Status: DC

## 2021-08-17 NOTE — Consult Note (Signed)
Neurology Consultation Reason for Consult: Ataxia, age indeterminant Requesting Physician: Glendora Score  CC: Difficulty walking  History is obtained from: Patient, family bedside, chart review  HPI: Regina Marsh is a 75 y.o. female with a past medical history significant for Sjogren's disease, hypertension, hyperlipidemia with statin intolerance hypothyroidism, lower back pain  She reports that several months ago she had an episode of difficulty walking that quickly resolved.  However in the past several days she has had 2 additional episodes.  Due to concern for potential to fall given her unsteadiness she presented to the ED for further evaluation.  She notes that she has not had any focal neurological symptoms of weakness or numbness, does not fall to any 1 particular side, has not had any proximal muscle weakness, jaw claudication or temporal tenderness, and has not had any fevers or chills, nausea or vomiting, urinary or bowel symptoms other than her typical diarrhea when exposed to lactose which last happened 2 days prior to admission and has resolved.  She feels fine when she initially stands but then after taking a few steps will have a feeling that she is going to fall.  She denies feeling like the room is spinning or having tunnel vision.  With continued walking with support the symptoms resolve.  She typically takes hydrochlorothiazide for her hypertension and has been tolerating this well though she did not take it the morning of her presentation to the ED for evaluation.  She reports her blood pressures at home are quite labile and she is unsure if her blood pressure cuff is appropriately calibrated.  On chart review she does struggle with significant hypertension that has been refractory to multiple medications.  Medication use has also been complicated by allergic reactions for which she was seen by an allergist in March 2022, with low concern for traditional allergies but some  potential concern for mast cell syndrome.  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  Current Outpatient Medications:    acetaminophen (TYLENOL) 325 MG tablet, Take 325 mg by mouth every 6 (six) hours as needed for moderate pain., Disp: , Rfl:    Cholecalciferol (VITAMIN D3) 50 MCG (2000 UT) TABS, Take 2,000 Units by mouth daily., Disp: , Rfl:    diphenhydrAMINE HCl (BENADRYL ALLERGY PO), Take 0.5-1 tablets by mouth daily as needed (allergy)., Disp: , Rfl:    famotidine (PEPCID) 20 MG tablet, Take 1 tablet (20 mg total) by mouth 2 (two) times daily., Disp: 60 tablet, Rfl: 5  gabapentin (NEURONTIN) 100 MG capsule, Take 100 mg by mouth daily as needed (pain)., Disp: , Rfl:    Garlic (GARLIQUE PO), Take 1 tablet by mouth daily., Disp: , Rfl:    hydrochlorothiazide (HYDRODIURIL) 25 MG tablet, Take 25 mg by mouth daily., Disp: , Rfl:    Lactase (LACTAID PO), Take 1 tablet by mouth daily as needed (lactose intolerance)., Disp: , Rfl:    Lactobacillus (PROBIOTIC ACIDOPHILUS PO), Take 1 tablet by mouth  daily., Disp: , Rfl:    Melatonin 500 MCG TBDP, Take 1 tablet by mouth daily as needed (sleep)., Disp: , Rfl:    Menatetrenone (VITAMIN K2) 100 MCG TABS, Take 100 mcg by mouth daily., Disp: , Rfl:    OVER THE COUNTER MEDICATION, Take 1,500 mg by mouth daily. Beet root, Disp: , Rfl:    Propylene Glycol (SYSTANE BALANCE OP), Place 1 drop into both eyes 3 (three) times daily as needed (dry eyes)., Disp: , Rfl:    Simethicone (GAS-X PO), Take 1 tablet by mouth daily as needed (flatulance)., Disp: , Rfl:    thyroid (ARMOUR) 15 MG tablet, Take 15 mg by mouth daily., Disp: , Rfl:    Family History  Problem Relation Age of Onset   Hypertension Mother    Heart attack Mother    Stroke Father    Leukemia Father    Allergic rhinitis Neg Hx    Angioedema Neg Hx    Asthma Neg Hx    Atopy Neg Hx    Eczema Neg Hx    Immunodeficiency Neg Hx    Urticaria Neg Hx     Social History:  reports that she has never smoked. She has never used smokeless tobacco. She reports that she does not currently use alcohol. She reports that she does not currently use drugs.   Exam: Current vital signs: BP (!) 194/73   Pulse 67   Temp 98.1 F (36.7 C) (Oral)   Resp 20   SpO2 100%  Vital signs in last 24 hours: Temp:  [97.9 F (36.6 C)-98.1 F (36.7 C)] 98.1 F (36.7 C) (11/07 1212) Pulse Rate:  [55-80] 67 (11/07 1545) Resp:  [13-22] 20 (11/07 1545) BP: (163-232)/(70-111) 194/73 (11/07 1545) SpO2:  [97 %-100 %] 100 % (11/07 1545)  Blood pressure elevated to 230/81 lying, dropping to 208/95 when sitting and 199/92 when standing  Physical Exam  Constitutional: Appears well-developed and well-nourished.  Psych: Affect appropriate to situation, mildly anxious but calm and cooperative Eyes: No scleral injection HENT: No oropharyngeal obstruction.  No temporal tenderness MSK: no joint deformities.  Cardiovascular: Normal rate and regular rhythm.  Respiratory: Effort normal, non-labored breathing GI: Soft.   No distension. There is no tenderness.  Skin: Warm dry and intact visible skin  Neuro: Mental Status: Patient is awake, alert, oriented to person, place, month, year, and situation. Patient is able to give a clear and coherent history. No signs of aphasia or neglect Cranial Nerves: II: Visual Fields are full.  III,IV, VI: EOMI without ptosis or diploplia.  V: Facial sensation is symmetric to temperature VII: Facial movement is notable for a mild left facial droop which family reports is baseline.  VIII: hearing is intact to voice X: Uvula elevates symmetrically XI: Shoulder shrug is symmetric. XII: tongue is midline without atrophy or fasciculations.  Motor: Tone is normal. Bulk is normal. 5/5 strength was present in all four extremities.  Sensory: Sensation is symmetric to light touch and temperature in the arms and legs. Deep  Tendon Reflexes: 2+ and symmetric in the biceps and patellae.  Cerebellar: FNF and HKS are intact bilaterally Gait: Slightly wide-based and cautious, but patient is able to rise on heels and toes and able to maintain tandem stance with only some mild unsteadiness more prominent when the right foot is in front  NIHSS total 0    I have reviewed labs in epic and the results pertinent to this consultation are:  Basic Metabolic Panel: Recent Labs  Lab 08/16/21 0853  NA 141  K 3.1*  CL 101  CO2 29  GLUCOSE 122*  BUN 15  CREATININE 0.98  CALCIUM 9.7    CBC: Recent Labs  Lab 08/16/21 0853  WBC 8.1  NEUTROABS 6.0  HGB 13.2  HCT 38.6  MCV 88.3  PLT 241    Coagulation Studies: Recent Labs    08/16/21 0910  LABPROT 13.3  INR 1.0    No results found for: CHOL, HDL, LDLCALC, LDLDIRECT, TRIG, CHOLHDL   I have reviewed the images obtained:  CTA personally reviewed, agree with radiology: There is no acute intracranial hemorrhage or evidence of acute infarction. ASPECT score is 10.   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   Intracranial atherosclerosis. Moderate stenosis right vertebral artery and moderate to marked stenosis left vertebral artery just beyond PICA origin. There is stenosis at the left PICA origin. The posterior cerebral arteries are poorly visualized and likely diffusely irregular and stenotic. Probable early left MCA bifurcation with superimposed stenosis. Stenosis of proximal left A1 ACA.  MRI brain personally reviewed, agree with radiology: Brain: There is no acute infarction or intracranial hemorrhage. There is no mass effect or edema. There is no hydrocephalus or extra-axial fluid collection. Ventricles and sulci are normal in size and configuration. A 7 mm dural-based lesion along the right frontal convexity (series 18, image 35) and an 8 mm dural-based lesion along the left frontoparietal convexity (series 18, image 46 are consistent with small meningiomas.   Impression: This is a 75 year old woman presenting with some dizziness that is provoked in the setting of walking in a pattern that sounds concerning for orthostatic hypotension.  Risk factors for CAD include age, as well as her Sjogren's.  She additionally has severe hypertension which is a chronic issue per review of the chart for which she is continuing to undergo outpatient evaluation.  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  Low concern that these events are seizures given they do not happen ever at rest but only when she is walking, and they resolve with continued activity; her meningiomas should be followed on an outpatient basis as well  Initial recommendation: -MRI brain to help evaluate acuity of right MCA occlusion  Further recommendations: -Initiate aspirin 81 mg daily given her significant intracranial atherosclerosis -Outpatient follow-up for orthostatic  hypotension, as well as her refractory hypertension -Consider EMG/nerve conduction study for evaluation of potential neuropathy, for which one of her risk factors is Sjogren's disease -Serial imaging to monitor meningioma stability -Ambulatory referral to neurology placed  Brooke Dare MD-PhD [facility] [phone]

## 2021-08-17 NOTE — Telephone Encounter (Signed)
Please send prescription for pantoprazole 40 mg daily x 30 days with 1 refill for GERD given persistent symptoms despite H2 blocker. Follow-up office visit next available appointment.  Thank you

## 2021-08-17 NOTE — Telephone Encounter (Signed)
Called patient to inform pantoprazole sent to pharmacy  She has a follow up appointment on 1/4

## 2021-08-18 ENCOUNTER — Encounter (HOSPITAL_BASED_OUTPATIENT_CLINIC_OR_DEPARTMENT_OTHER): Payer: Self-pay

## 2021-08-26 ENCOUNTER — Ambulatory Visit (HOSPITAL_COMMUNITY)
Admission: RE | Admit: 2021-08-26 | Discharge: 2021-08-26 | Disposition: A | Discharge status: Home / Self Care | Payer: Medicare Other | Source: Ambulatory Visit | Attending: Gastroenterology | Admitting: Gastroenterology

## 2021-08-26 ENCOUNTER — Other Ambulatory Visit: Payer: Self-pay

## 2021-08-26 DIAGNOSIS — K219 Gastro-esophageal reflux disease without esophagitis: Secondary | ICD-10-CM | POA: Diagnosis not present

## 2021-08-26 DIAGNOSIS — K449 Diaphragmatic hernia without obstruction or gangrene: Secondary | ICD-10-CM

## 2021-08-26 DIAGNOSIS — E739 Lactose intolerance, unspecified: Secondary | ICD-10-CM

## 2021-08-26 DIAGNOSIS — K760 Fatty (change of) liver, not elsewhere classified: Secondary | ICD-10-CM | POA: Diagnosis not present

## 2021-08-26 DIAGNOSIS — R1013 Epigastric pain: Secondary | ICD-10-CM

## 2021-08-26 IMAGING — RF DG UGI W/ HIGH DENSITY W/O KUB
12 of 15 series · 12 of 24 positions shown · non-contrast
Comparison: None.

CLINICAL DATA: Epigastric pain, hiatal hernia.

EXAM:
UPPER GI SERIES WITH KUB
TECHNIQUE: After obtaining a scout radiograph a routine upper GI series was
performed using thin and high density barium.
FLUOROSCOPY TIME:  Fluoroscopy Time:  2 minutes, 42 seconds
Radiation Exposure Index (if provided by the fluoroscopic device):
35.4 mGy
Number of Acquired Spot Images: 4

[Series 2: cp_standard · 0.34mm/px · 1 of 17 frames shown (1 of 11)]
[frame 3/17]
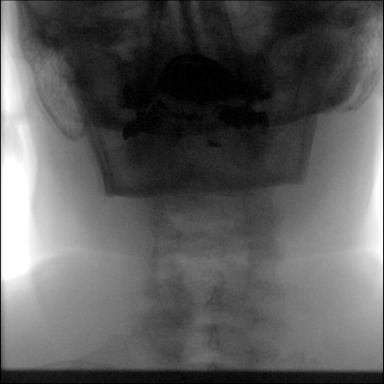

[Series 3: cp_standard · 0.34mm/px · 1 of 48 frames shown (2 of 11)]
[frame 8/48]
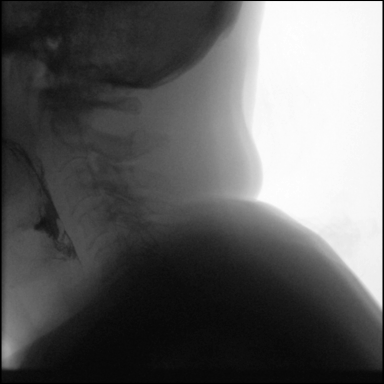

[Series 4: cp_standard · 0.34mm/px · 1 of 167 frames shown (3 of 11)]
[frame 29/167]
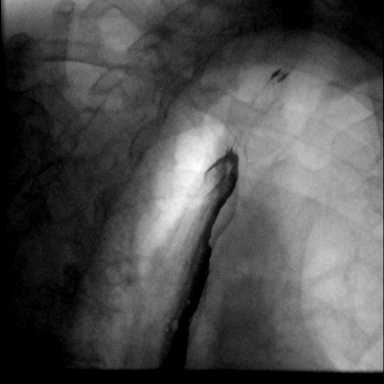

[Series 7: fluoro_barium 2fps_bw · 0.17mm/px · 1 of 1 slices shown]
[im 1/1]
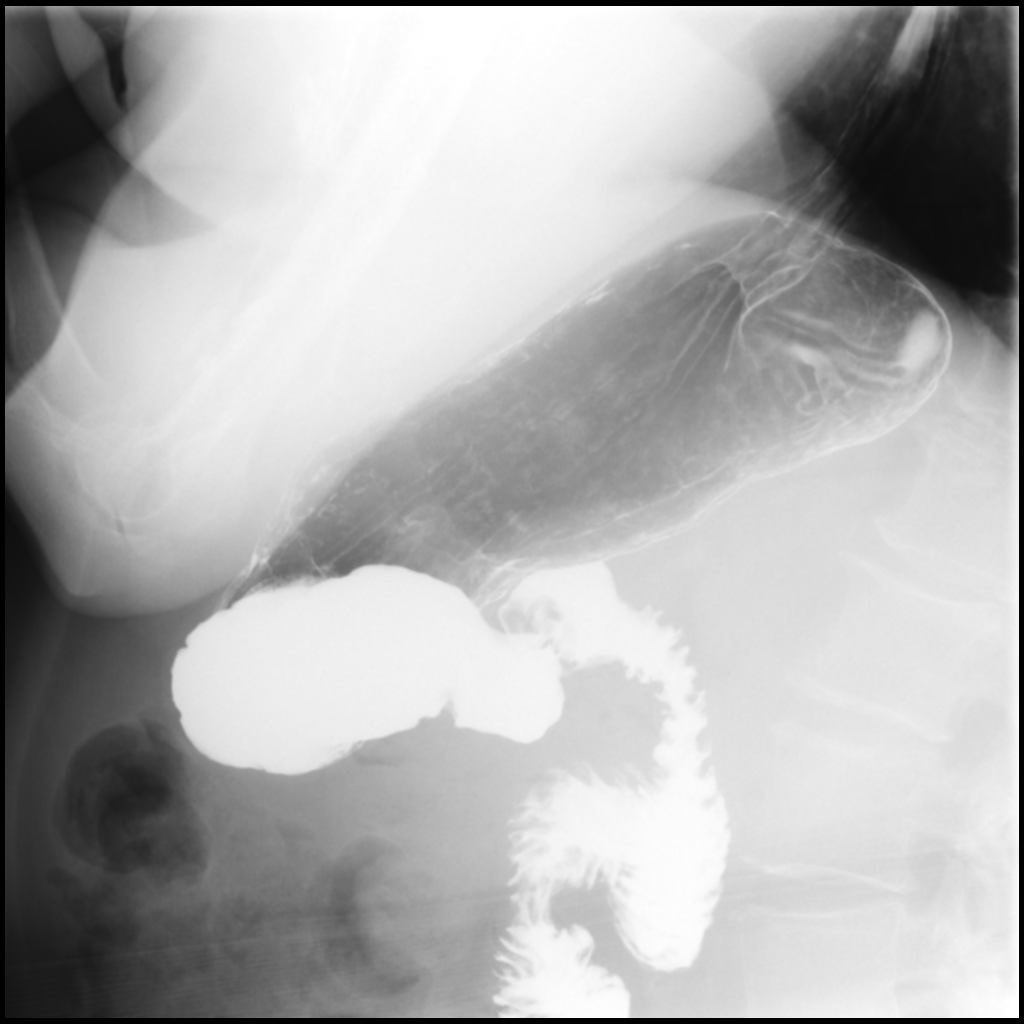

[Series 10: cp_standard · 0.34mm/px · 1 of 39 frames shown (4 of 11)]
[frame 20/39]
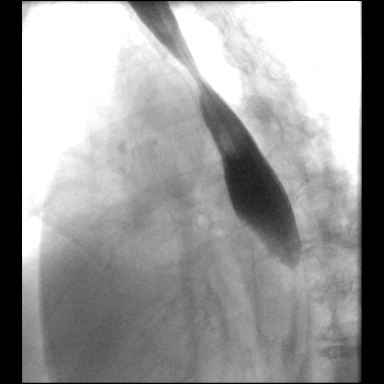

[Series 11: cp_standard · 0.35mm/px · 1 of 25 frames shown (5 of 11)]
[frame 13/25]
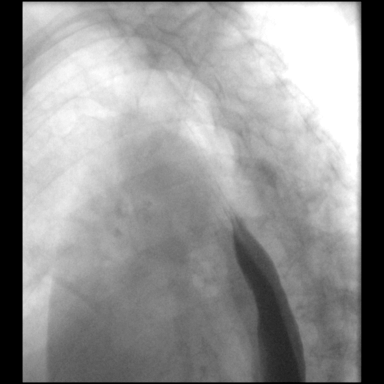

[Series 12: cp_standard · 0.35mm/px · 1 of 29 frames shown (6 of 11)]
[frame 15/29]
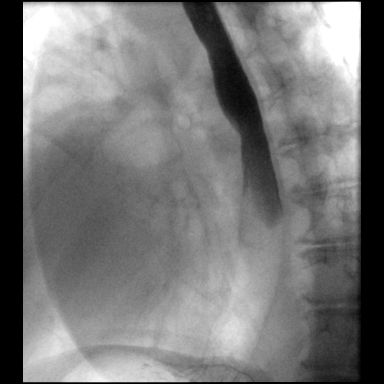

[Series 13: cp_standard · 0.35mm/px · 1 of 38 frames shown (7 of 11)]
[frame 20/38]
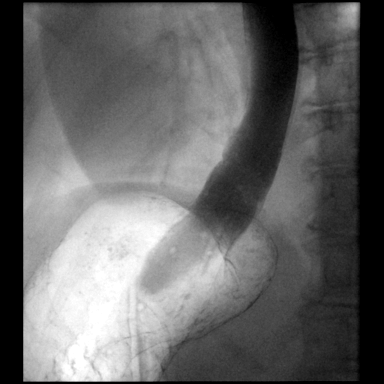

[Series 14: cp_standard · 0.35mm/px · 1 of 35 frames shown (8 of 11)]
[frame 13/35]
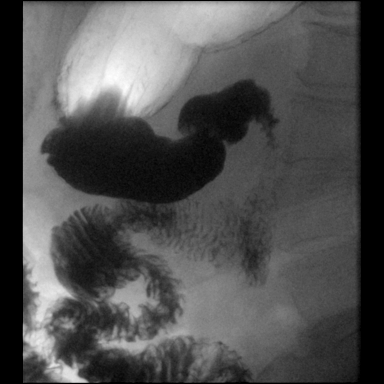

[Series 15: cp_standard · 0.35mm/px · 1 of 23 frames shown (9 of 11)]
[frame 15/23]
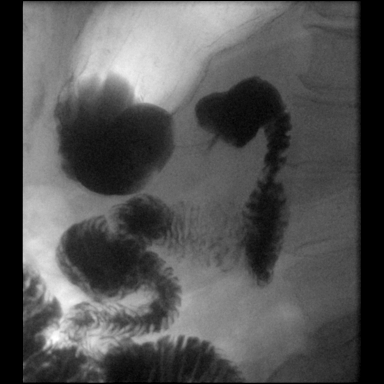

[Series 16: cp_standard · 0.35mm/px · 1 of 19 frames shown (10 of 11)]
[frame 16/19]
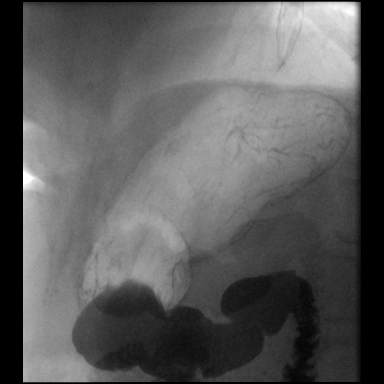

[Series 17: cp_standard · 0.35mm/px · 1 of 22 frames shown (11 of 11)]
[frame 19/22]
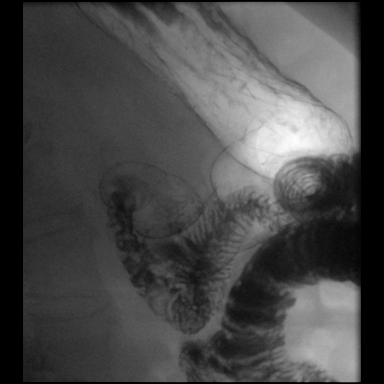

[12 of 24 positions shown; findings below may reference images not displayed]

FINDINGS: Initial KUB demonstrates unremarkable bowel gas pattern and
suspected mild cardiomegaly. Dextroconvex lumbar scoliosis.

The pharyngeal phase of swallowing appears normal.

Primary peristaltic waves in the esophagus were normal on [DATE]
swallows. Small type 1 hiatal hernia. Widely patent distal
esophageal benign mucosal ring, greater than 2 cm in diameter and
accordingly a highly unlikely to cause symptoms (image 28, series
13).

Gastric morphology and folds appear normal. The duodenal bulb and
remainder of the duodenum appear normal.
IMPRESSION: 1. Small type 1 hiatal hernia.
2. Widely patent distal esophageal benign mucosal ring, greater than
2 cm in diameter, unlikely to cause symptoms.
3. Mild dextroconvex lumbar scoliosis.
4. Suspected mild cardiomegaly.
5. Otherwise normal exam.

## 2021-08-26 IMAGING — US US ABDOMEN COMPLETE
1 series · 15 of 25 positions shown · non-contrast
Comparison: None.

CLINICAL DATA: Epigastric abdominal pain. Gastroesophageal reflux
disease.

EXAM:
ABDOMEN ULTRASOUND COMPLETE

[Series 1: us abdomen complete mc & wl · 15 of 95 slices shown]
[im 1/95]
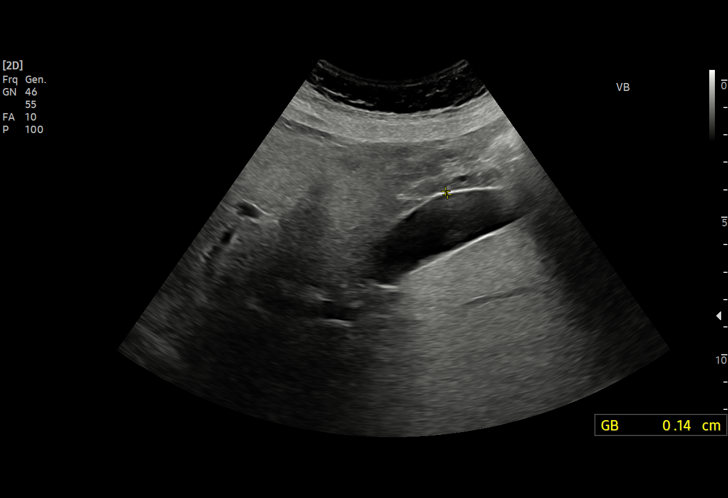
[im 8/95]
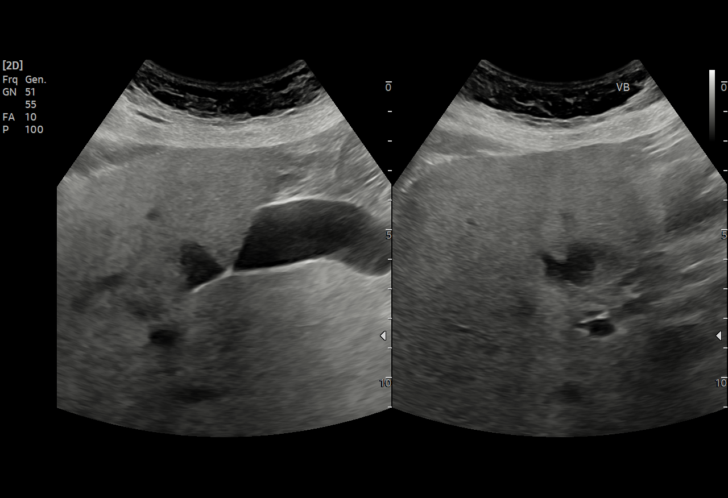
[im 16/95]
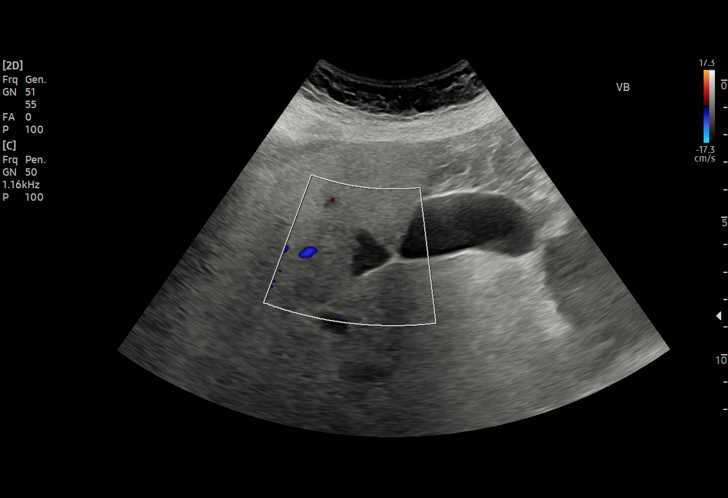
[im 20/95]
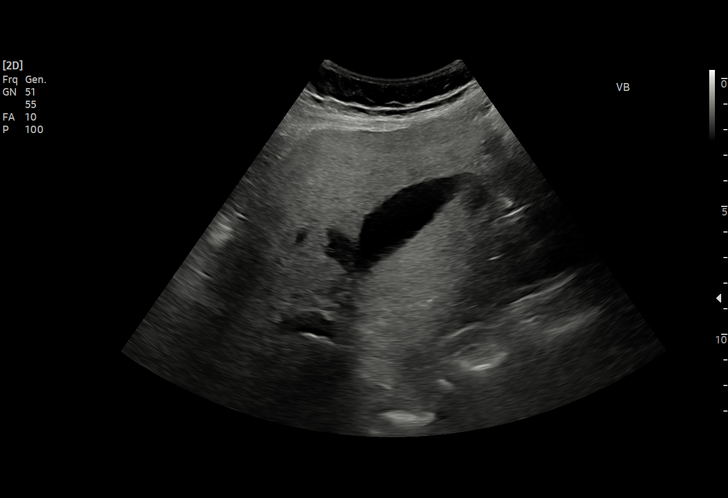
[im 28/95]
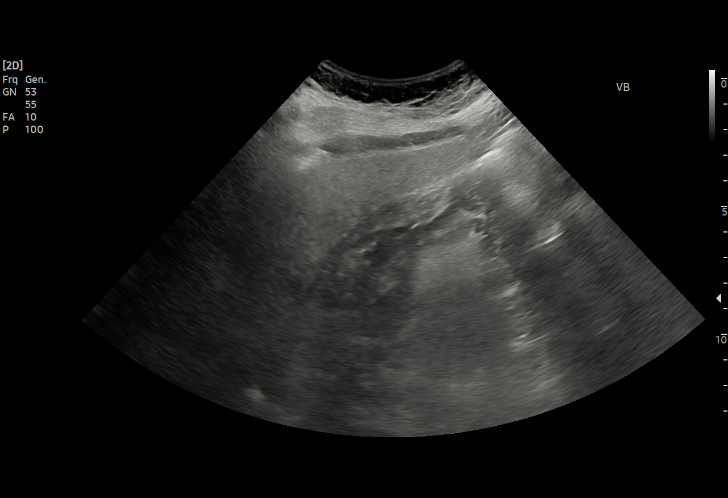
[im 36/95]
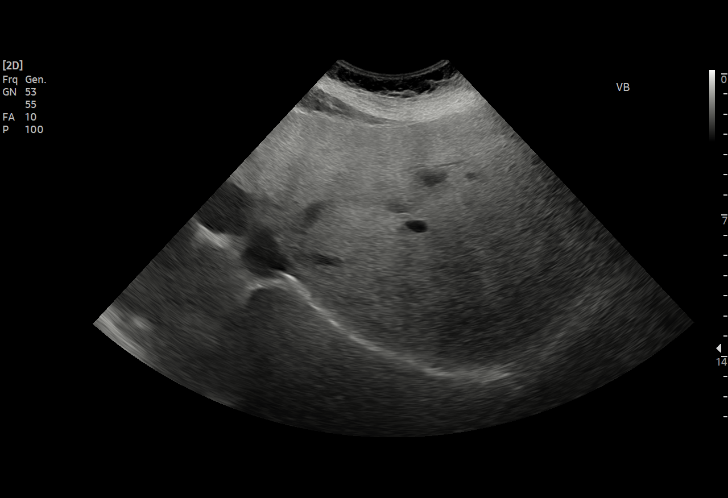
[im 40/95]
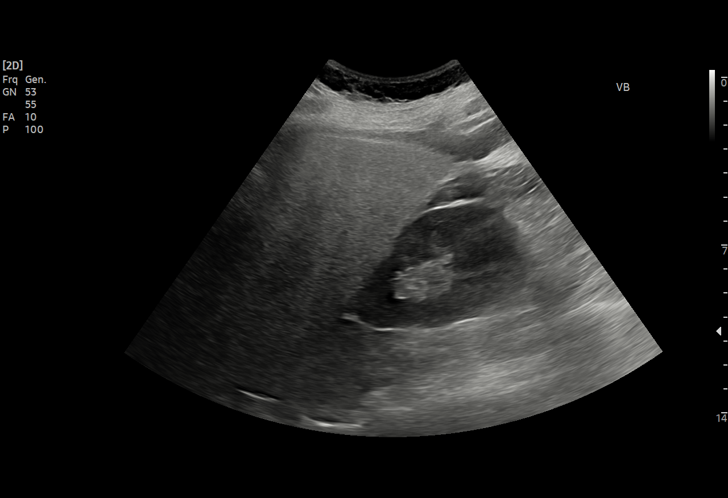
[im 48/95]
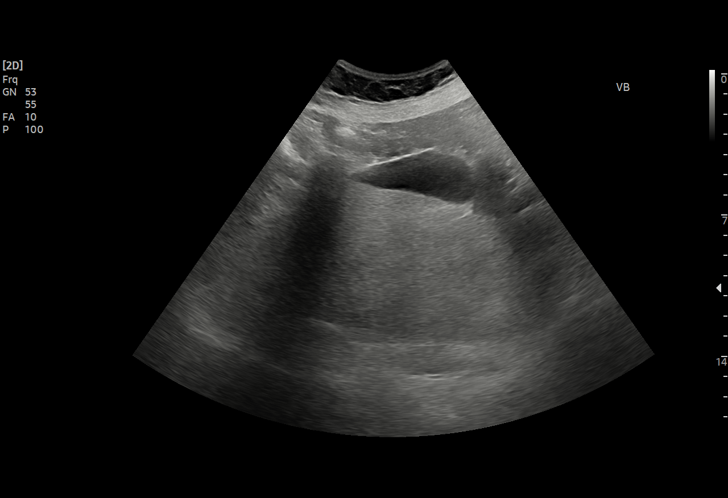
[im 55/95]
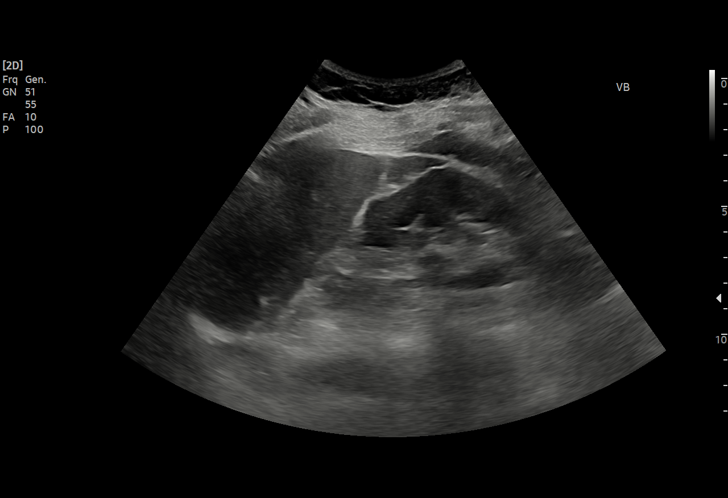
[im 59/95]
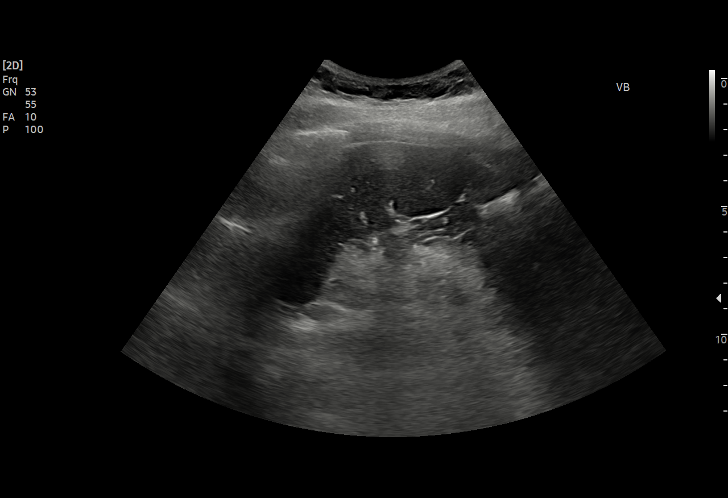
[im 67/95]
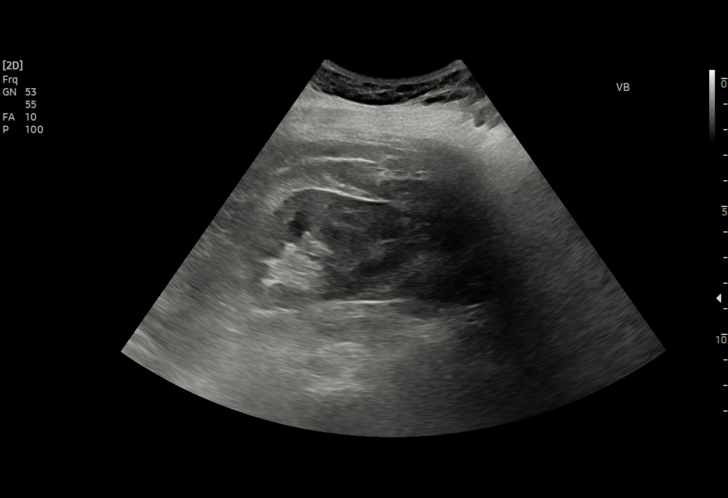
[im 75/95]
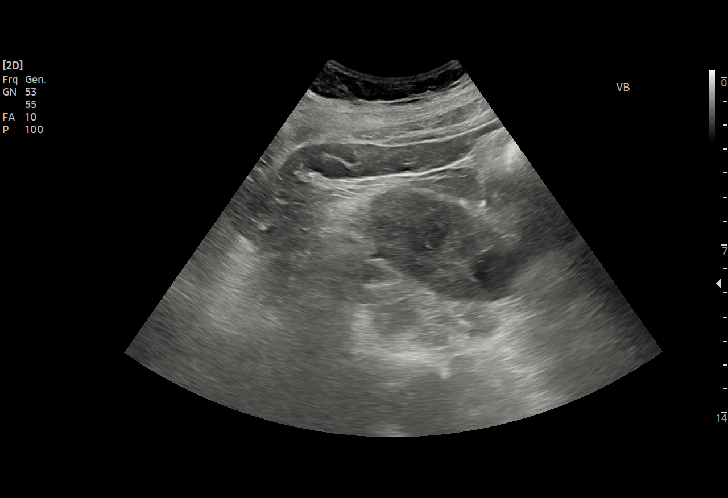
[im 79/95]
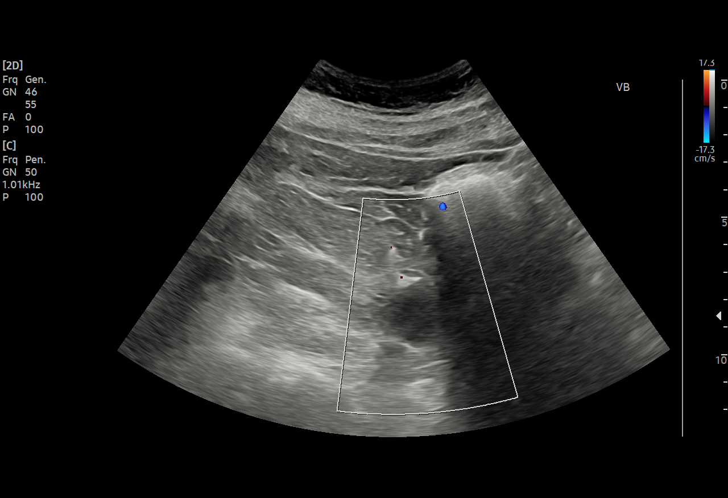
[im 87/95]
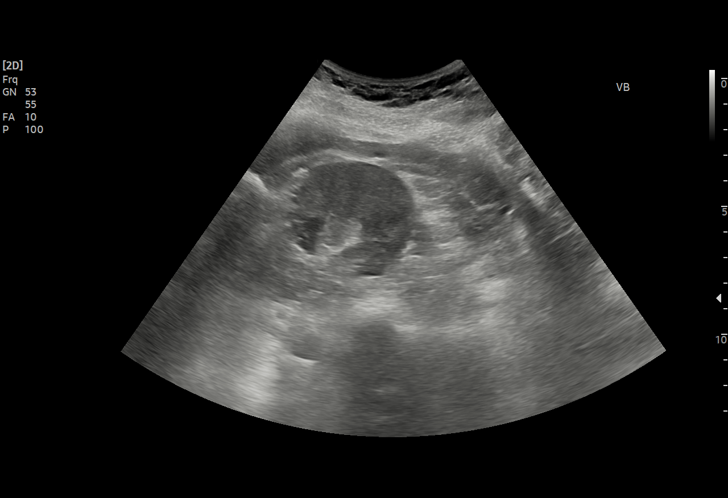
[im 95/95]
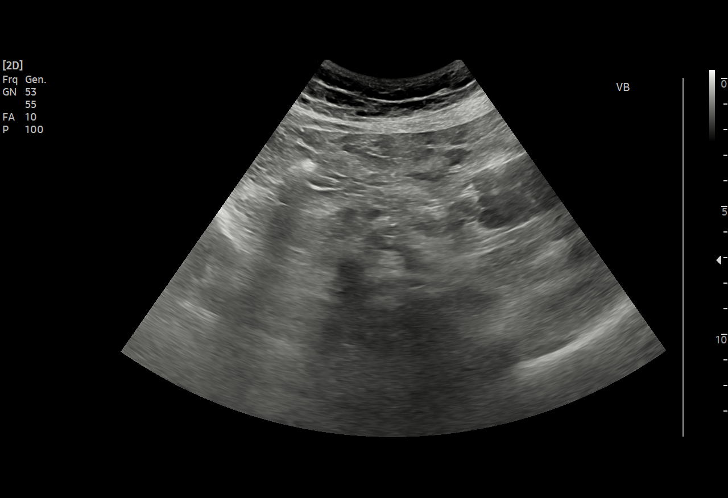

[15 of 25 positions shown; findings below may reference images not displayed]

FINDINGS: Gallbladder: No gallstones or wall thickening visualized. No
sonographic Murphy sign noted by sonographer.

Common bile duct: Diameter: 0.5 cm, within normal limits

Liver: Accentuated echogenicity suspicious for steatosis. Mild fatty
sparing along the gallbladder fossa. Portal vein is patent on color
Doppler imaging with normal direction of blood flow towards the
liver.

IVC: No abnormality visualized.

Pancreas: Visualized portion unremarkable.

Spleen: Size and appearance within normal limits.

Right Kidney: Length: 10.4 cm. Echogenicity within normal limits. No
hydronephrosis. Lower pole exophytic 2.7 by 3.5 by 2.4 cm lesion has
enhanced through transmission and no appreciable blood flow,
favoring a mildly complex cyst given the internal echoes.

Left Kidney: Length: 10.1 cm. Echogenicity within normal limits. No
mass or hydronephrosis visualized.

Abdominal aorta: No aneurysm visualized.

Other findings: None.
IMPRESSION: 1. Diffuse hepatic steatosis with some focal fatty sparing along the
gallbladder fossa.
2. Exophytic 3.5 cm in long axis lesion along the right kidney lower
pole has enhanced through transmission and no appreciable Doppler
flow, but some faint internal echoes favoring complex cyst. If the
patient has hematuria or if otherwise clinically warranted this
could be characterized in further detail with renal protocol CT or
MRI with and without contrast.

## 2021-08-27 ENCOUNTER — Ambulatory Visit: Payer: Medicare Other | Admitting: Neurology

## 2021-08-27 ENCOUNTER — Encounter: Payer: Self-pay | Admitting: Neurology

## 2021-08-27 VITALS — BP 210/104 | HR 90 | Ht 62.0 in | Wt 168.0 lb

## 2021-08-27 DIAGNOSIS — I679 Cerebrovascular disease, unspecified: Secondary | ICD-10-CM | POA: Diagnosis not present

## 2021-08-27 DIAGNOSIS — I10 Essential (primary) hypertension: Secondary | ICD-10-CM

## 2021-08-27 DIAGNOSIS — R2681 Unsteadiness on feet: Secondary | ICD-10-CM

## 2021-08-27 DIAGNOSIS — R292 Abnormal reflex: Secondary | ICD-10-CM

## 2021-08-27 NOTE — Patient Instructions (Signed)
MRI cervical spine without contrast    

## 2021-08-27 NOTE — Progress Notes (Signed)
[facility] Clinic Note - Initial Visit   Date: 08/27/21  Jaquia Benedicto MRN: [id number] DOB: 1945-11-23   Dear Dr.Bhagat:  Thank you for your kind referral of Regina Marsh for consultation of neuropathy. Although her history is well known to you, please allow Regina Marsh to reiterate it for the purpose of our medical record. The patient was accompanied to the clinic by husband who also provides collateral information.     History of Present Illness: Regina Marsh is a 75 y.o. right-handed female with refractory hypertension, Sjogren'sdisease, hypothyroidism, hypertension, and GERD, intracranial stenosis presenting for evaluation of bilateral leg weakness.   On 11/7, she went to the ER because of having difficulty walking, described as leg weakness and sensation that her legs would buckle. She did not fall, but felt like her legs were jello. She ambulated with her husband's support.  There was no leg pain, numbness, or tingling.  At the ER, she underwent CTA head and neck which showed R M1 occlusion with reconstitution distally, moderate bilateral vertebral stenosis, worseon the left, and left A1 stenosis. No significant stenosis of the carotid arteries (LICA < 50%).  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 She was evaluated by neurohospitalist who recommended she start aspirin 81mg  daily and follow-up with neuropathy. Of note, her orthostatic blood pressure was positive in the ER (230/81 supine, 208/95 sitting, 199/92 standing). Her BP has been very labile over the past year. Today, it is very elevated 210/104.  She denies SOB or chest pain.     Out-side paper records, electronic medical record, and images have been reviewed where available and summarized as:  CTA head and neck wwo contrast 08/16/2021: There is no acute intracranial hemorrhage or evidence of acute infarction. ASPECT score is 10.   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   Intracranial atherosclerosis. Moderate stenosis right vertebral artery and moderate to marked stenosis left vertebral artery just beyond PICA origin. There is stenosis at the left PICA origin. The posterior cerebral arteries are poorly visualized and likely diffusely irregular and stenotic. Probable early left MCA bifurcation with superimposed stenosis. Stenosis of proximal left A1 ACA.   No hemodynamically significant stenosis in the neck. Noncalcified plaque at the left ICA origin with less than 50% stenosis.   MRI brain wwo contrast 08/16/2021:  No acute infarction. Two small meningiomas.  Lab Results  Component Value Date   HGBA1C 5.6 12/14/2020   No results found for: VITAMINB12 Lab Results  Component Value Date   TSH 1.302 08/16/2021   Lab Results  Component Value Date   ESRSEDRATE 17 12/14/2020    Past Medical History:  Diagnosis Date   GERD (gastroesophageal reflux disease)    Hypertension    Hypothyroidism    Low back pain    Sjogren's disease (HCC)     Past Surgical History:  Procedure Laterality Date   BREAST LUMPECTOMY Bilateral    left-1966, right-2001   CESAREAN SECTION  1984   detached eye lid Left 2011   LAPAROSCOPY     x 2, for adhesions, 1989 and 1990   NASAL SINUS SURGERY  2006   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  Cholecalciferol (VITAMIN D3) 50 MCG (2000 UT) TABS Take 2,000 Units by mouth daily. Take 2 tablets daily   diphenhydrAMINE HCl (BENADRYL ALLERGY PO) Take 0.5-1 tablets by mouth daily as needed (allergy).   famotidine (PEPCID) 20 MG tablet Take 1 tablet (20 mg total) by mouth 2 (two) times daily.   Garlic (GARLIQUE PO) Take 1 tablet by mouth daily.    hydrochlorothiazide (HYDRODIURIL) 25 MG tablet Take 25 mg by mouth daily.   Lactase (LACTAID PO) Take 1 tablet by mouth daily as needed (lactose intolerance).   Lactobacillus (PROBIOTIC ACIDOPHILUS PO) Take 1 tablet by mouth daily.   Melatonin 500 MCG TBDP Take 1 tablet by mouth daily as needed (sleep).   Menatetrenone (VITAMIN K2) 100 MCG TABS Take 100 mcg by mouth daily.   OVER THE COUNTER MEDICATION Take 1,500 mg by mouth daily. Beet root   Propylene Glycol (SYSTANE BALANCE OP) Place 1 drop into both eyes 3 (three) times daily as needed (dry eyes).   Rectal Thermometer MISC by Does not apply route.   Simethicone (GAS-X PO) Take 1 tablet by mouth daily as needed (flatulance).   thyroid (ARMOUR) 15 MG tablet Take 15 mg by mouth daily.   gabapentin (NEURONTIN) 100 MG capsule Take 100 mg by mouth daily as needed (pain). (Patient not taking: Reported on 08/27/2021)   pantoprazole (PROTONIX) 40 MG tablet Take 1 tablet (40 mg total) by mouth daily. (Patient not taking: Reported on 08/27/2021)   No facility-administered encounter medications on file as of 08/27/2021.    Allergies:  Allergies  Allergen Reactions   African Mango [Irvingia Gabonensis]    Allegra [Fexofenadine] Other (See Comments)    Abdominal pain   Amlodipine     lightheadedness   Atorvastatin Other (See Comments)   Cetirizine Hcl Other (See Comments)   Chlorthalidone     lightheadedness   Lactose Intolerance (Gi) Other (See Comments)    Upset stomach   Lisinopril-Hydrochlorothiazide     Light headed   Loratadine Other (See Comments)   Penicillins Other (See Comments)   Pravastatin Other (See Comments)   Simvastatin Other (See Comments)   Sulfa Antibiotics Other (See Comments)   Valsartan Other (See Comments)    Myalgias, cough, tongue swelling   Spironolactone Rash    Family History: Family History  Problem Relation Age of Onset   Hypertension Mother    Heart attack Mother    Stroke Father    Leukemia  Father    Allergic rhinitis Neg Hx    Angioedema Neg Hx    Asthma Neg Hx    Atopy Neg Hx    Eczema Neg Hx    Immunodeficiency Neg Hx    Urticaria Neg Hx     Social History: Social History   Tobacco Use   Smoking status: Never   Smokeless tobacco: Never  Vaping Use   Vaping Use: Never used  Substance Use Topics   Alcohol use: Not Currently   Drug use: Not Currently   Social History   Social History Narrative   Right handed    Lives in a two story home     Vital Signs:  BP (!) 210/104   Pulse 90   Ht 5\' 2"  (1.575 m)   Wt 168 lb (76.2 kg)   SpO2 99%   BMI 30.73 kg/m   Neurological Exam: MENTAL STATUS including orientation to time, place, person, recent and remote memory, attention span and concentration, language, and fund of knowledge is normal.  Speech  is not dysarthric.  CRANIAL NERVES: II:  No visual field defects.     III-IV-VI: Pupils equal round and reactive to light.  Normal conjugate, extra-ocular eye movements in all directions of gaze.  No nystagmus.  No ptosis.   V:  Normal facial sensation.    VII:  Normal facial symmetry and movements.   VIII:  Normal hearing and vestibular function.   IX-X:  Normal palatal movement.   XI:  Normal shoulder shrug and head rotation.   XII:  Normal tongue strength and range of motion, no deviation or fasciculation.  MOTOR:  No atrophy, fasciculations or abnormal movements.  No pronator drift.   Upper Extremity:  Right  Left  Deltoid  5/5   5/5   Biceps  5/5   5/5   Triceps  5/5   5/5   Infraspinatus 5/5  5/5  Medial pectoralis 5/5  5/5  Wrist extensors  5/5   5/5   Wrist flexors  5/5   5/5   Finger extensors  5/5   5/5   Finger flexors  5/5   5/5   Dorsal interossei  5/5   5/5   Abductor pollicis  5/5   5/5   Tone (Ashworth scale)  0  0   Lower Extremity:  Right  Left  Hip flexors  5/5   5/5   Hip extensors  5/5   5/5   Adductor 5/5  5/5  Abductor 5/5  5/5  Knee flexors  5/5   5/5   Knee extensors  5/5    5/5   Dorsiflexors  5/5   5/5   Plantarflexors  5/5   5/5   Toe extensors  5/5   5/5   Toe flexors  5/5   5/5   Tone (Ashworth scale)  0  0   MSRs:  Right        Left                  brachioradialis 3+  3+  biceps 3+  3+  triceps 3+  3+  patellar 2+  2+  ankle jerk 2+  2+  Hoffman no  no  plantar response down  down   SENSORY:  Normal and symmetric perception of light touch, pinprick, vibration, and proprioception.  Romberg's sign absent.   COORDINATION/GAIT: Normal finger-to- nose-finger.  Intact rapid alternating movements bilaterally.  Gait narrow based and stable. Tandem and stressed gait intact.    IMPRESSION: Gait instability with bilateral leg weakness. Unclear etiology.Stroke would not manifest as such and MRI brain did not demonstrate this.  Her exam shows brisk reflexes a in the arms, so I will check MRI cervical spine wo contrast for compressive pathology.  Reflexes in the legs are normal. No exam findings to suggest neuropathy either. She also has orthostatic hypotension with refractory hypertension and this could also manifest with gait instability.   2. Intracranial stenosis with multivessel disease.    - Continue aspirin 81mg  daily  3. Resistant hypertension  - Follow-up with PCP for BP control  Further recommendations pending results.   Thank you for allowing me to participate in patient's care.  If I can answer any additional questions, I would be pleased to do so.    Sincerely,    Martine Bleecker K. , DO

## 2021-09-16 ENCOUNTER — Other Ambulatory Visit: Payer: Self-pay

## 2021-09-16 ENCOUNTER — Ambulatory Visit
Admission: RE | Admit: 2021-09-16 | Discharge: 2021-09-16 | Disposition: A | Discharge status: Home / Self Care | Payer: Medicare Other | Source: Ambulatory Visit | Attending: Neurology | Admitting: Neurology

## 2021-09-16 DIAGNOSIS — R292 Abnormal reflex: Secondary | ICD-10-CM

## 2021-09-16 DIAGNOSIS — M2578 Osteophyte, vertebrae: Secondary | ICD-10-CM | POA: Diagnosis not present

## 2021-09-16 DIAGNOSIS — R29898 Other symptoms and signs involving the musculoskeletal system: Secondary | ICD-10-CM | POA: Diagnosis not present

## 2021-09-16 DIAGNOSIS — R2681 Unsteadiness on feet: Secondary | ICD-10-CM

## 2021-09-16 DIAGNOSIS — M47812 Spondylosis without myelopathy or radiculopathy, cervical region: Secondary | ICD-10-CM | POA: Diagnosis not present

## 2021-09-16 IMAGING — MR MR CERVICAL SPINE W/O CM
4 of 5 series · 27 of 48 positions shown · non-contrast
Comparison: CT [DATE].

CLINICAL DATA: Hyper reflexia and unsteady gait. Bilateral leg
weakness.

EXAM:
MRI CERVICAL SPINE WITHOUT CONTRAST
TECHNIQUE: Multiplanar, multisequence MR imaging of the cervical spine was
performed. No intravenous contrast was administered.

[Series 5: T2 · sagittal · 3.0mm · 0.55mm/px · 6 of 15 slices shown (1 of 2)]
[im 1/15]
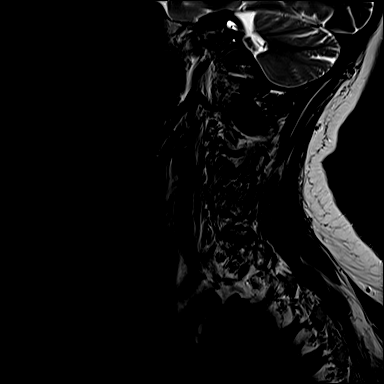
[im 3/15]
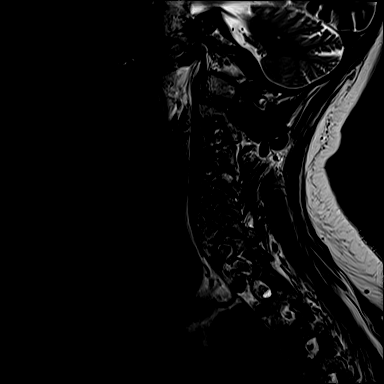
[im 6/15]
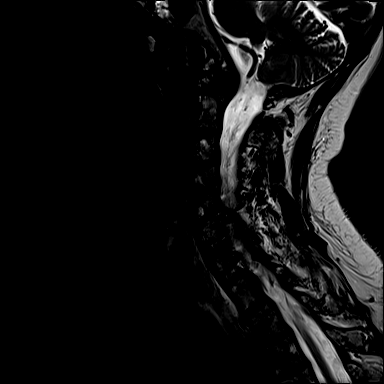
[im 9/15]
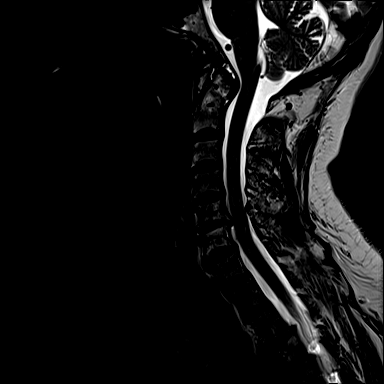
[im 12/15]
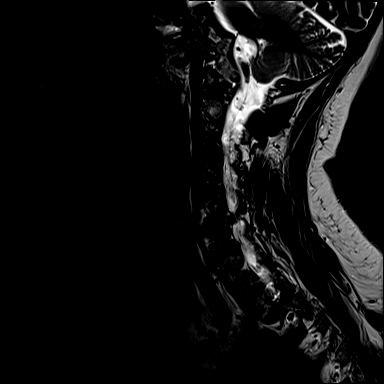
[im 15/15]
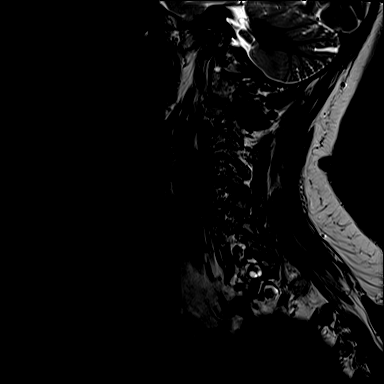

[Series 6: T1 · sagittal · 3.0mm · 0.66mm/px · 7 of 15 slices shown]
[im 1/15]
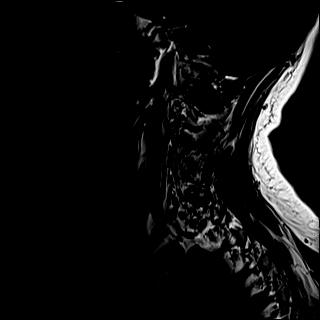
[im 3/15]
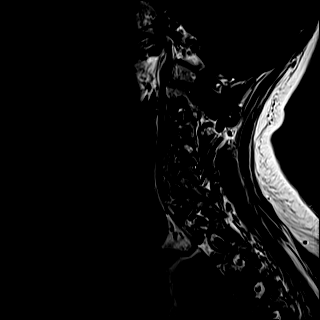
[im 5/15]
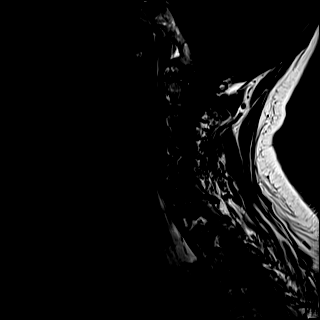
[im 8/15]
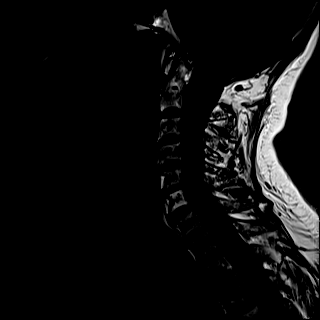
[im 10/15]
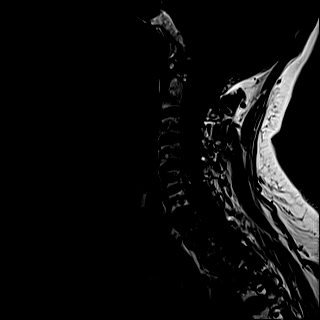
[im 12/15]
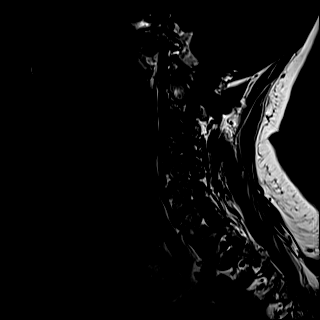
[im 15/15]
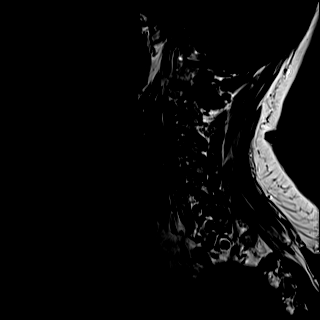

[Series 7: STIR · sagittal · 3.0mm · 0.33mm/px · 6 of 15 slices shown]
[im 1/15]
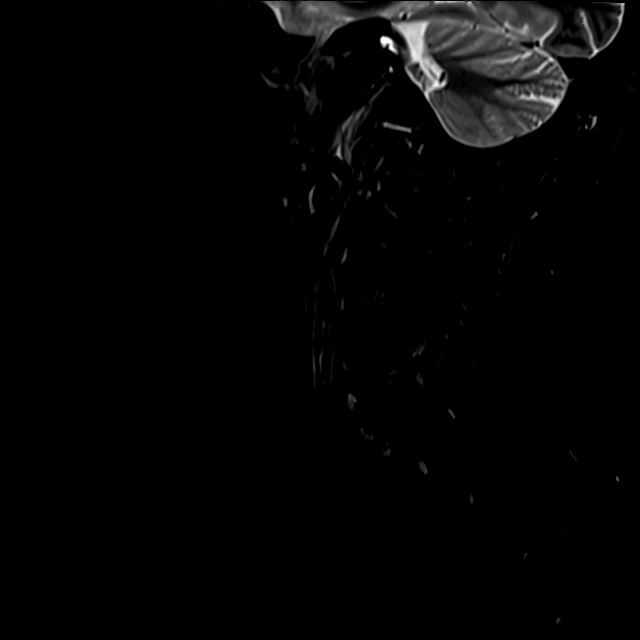
[im 3/15]
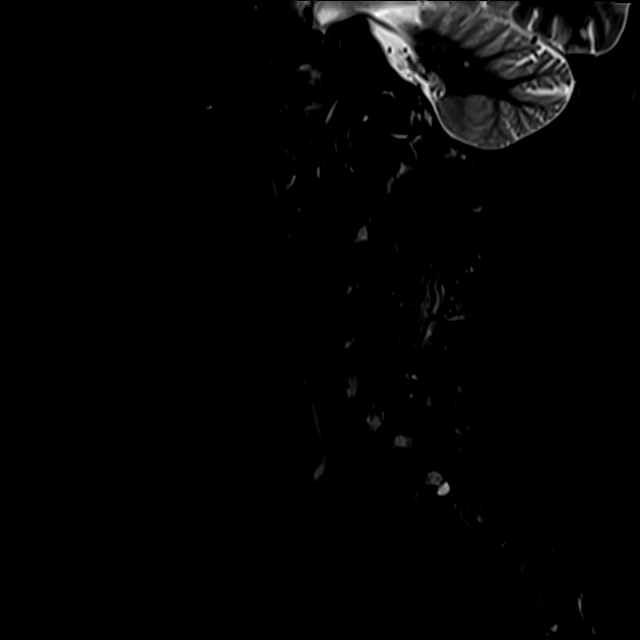
[im 5/15]
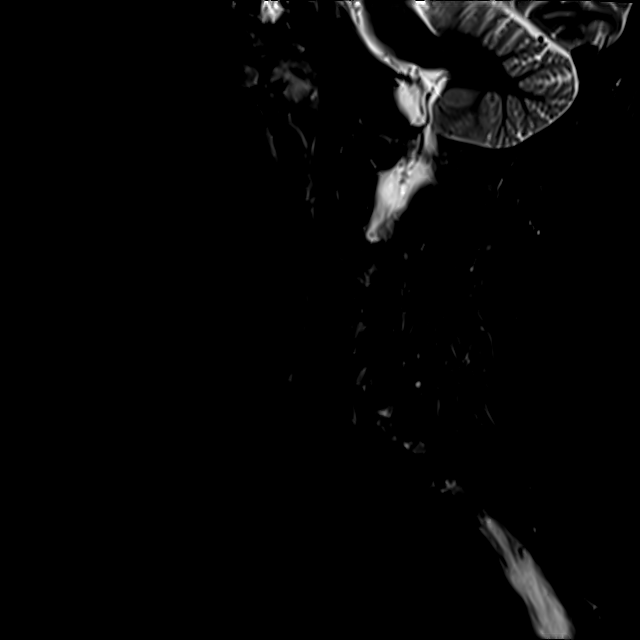
[im 8/15]
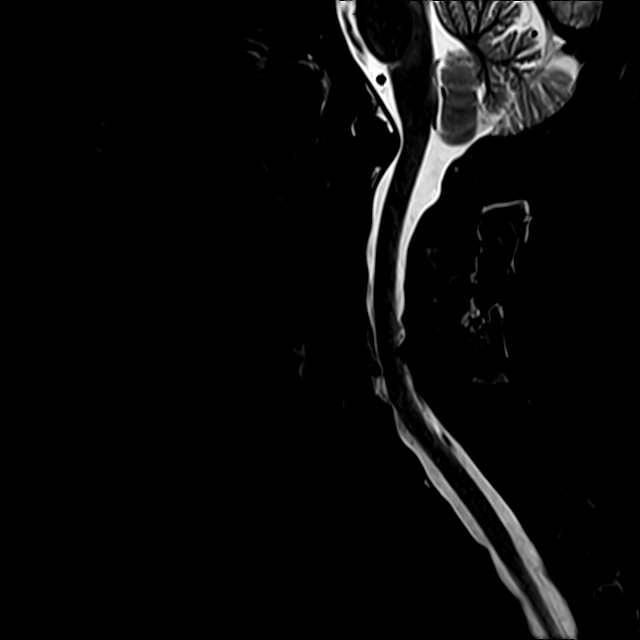
[im 10/15]
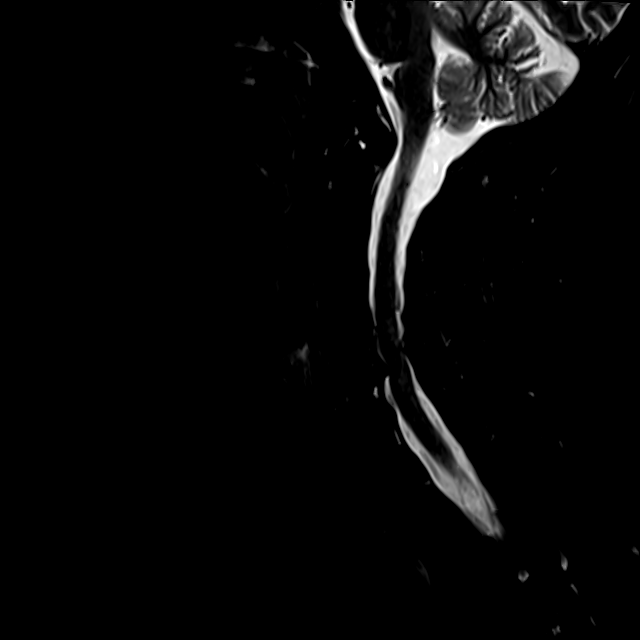
[im 12/15]
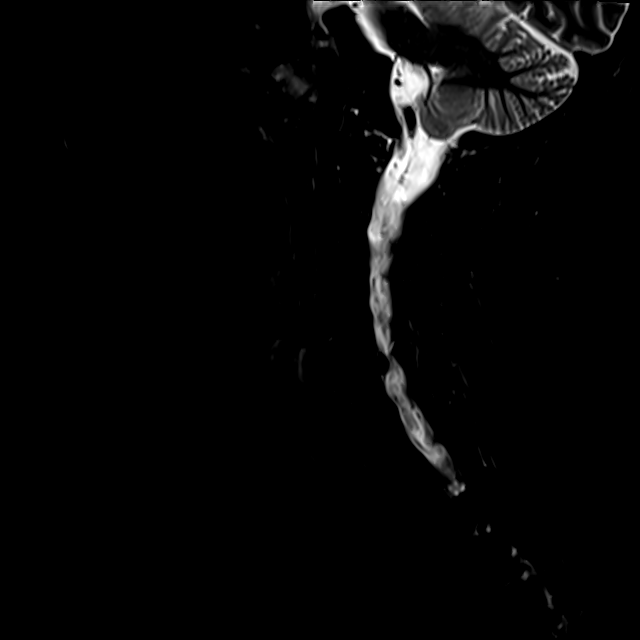

[Series 8: T2 · axial · 3.0mm · 0.50mm/px · z∈[-230,-129]mm · 8 of 32 slices shown (2 of 2)]
[im 1/32]
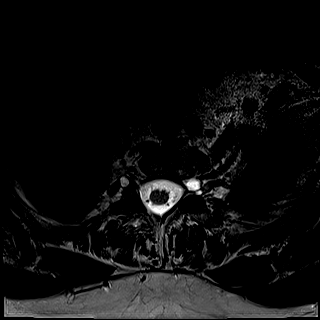
[im 5/32]
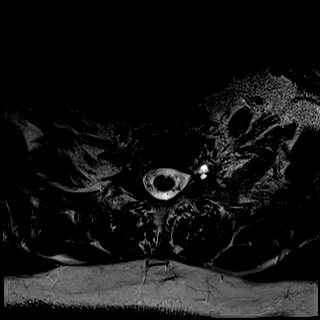
[im 10/32]
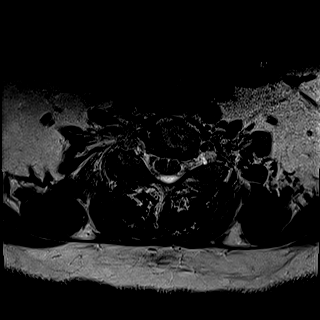
[im 15/32]
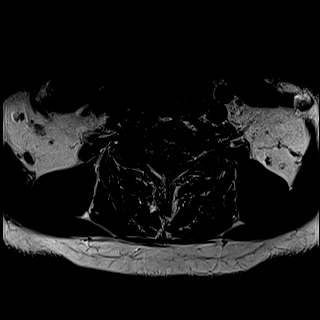
[im 17/32]
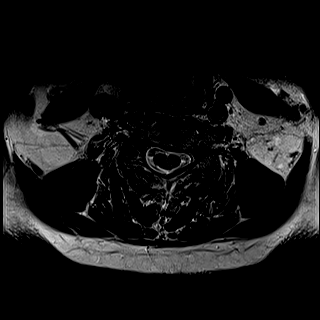
[im 22/32]
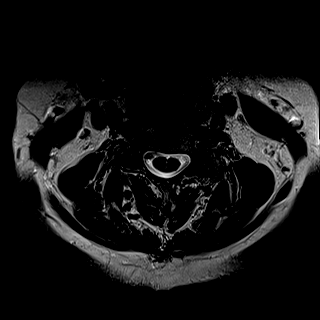
[im 27/32]
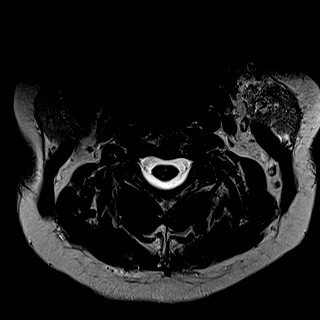
[im 32/32]
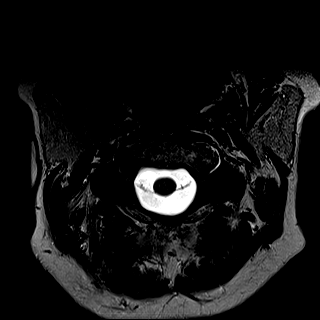

[27 of 48 positions shown; findings below may reference images not displayed]

FINDINGS: Alignment: No malalignment.  Incidental 1 mm anterolisthesis C7-T1.

Vertebrae: No fracture or focal bone lesion.

Cord: No cord compression or focal cord lesion.

Posterior Fossa, vertebral arteries, paraspinal tissues: Negative

Disc levels:

Foramen magnum is widely patent. Ordinary mild osteoarthritis at the
C1-2 articulation without encroachment upon the neural structures.

C2-3: Minimal disc bulge.  No stenosis.

C3-4: Minimal disc bulge. No central canal stenosis. Facet
osteoarthritis on the left with mild left foraminal narrowing.

C4-5: Mild bulging of the disc.  No canal or foraminal stenosis.

C5-6: Spondylosis with endplate osteophytes and bulging of the disc.
Narrowing of the ventral subarachnoid space with AP diameter of the
canal in the midline measuring 8.4 mm. No cord compression. Moderate
bony foraminal narrowing on the left and right.

C6-7: Mild bulging of the disc.  No canal or foraminal stenosis.

C7-T1: Mild facet osteoarthritis with 1 mm of anterolisthesis. No
disc pathology. No compressive stenosis.
IMPRESSION: Chronic degenerative spondylosis at C5-6 with osteophytic
encroachment upon the canal and foramina. AP diameter of the canal
in the midline is 8.4 mm in there is no cord compression. There is
moderate foraminal narrowing at this level which could possibly have
some compressive effect upon either C6 nerve.

## 2021-09-20 ENCOUNTER — Telehealth: Payer: Self-pay

## 2021-09-20 DIAGNOSIS — M4802 Spinal stenosis, cervical region: Secondary | ICD-10-CM

## 2021-09-20 DIAGNOSIS — Z8669 Personal history of other diseases of the nervous system and sense organs: Secondary | ICD-10-CM

## 2021-09-20 NOTE — Telephone Encounter (Signed)
-----   Message from Glendale Chard, DO sent at 09/17/2021 11:15 AM EST ----- Please let pt know that she does have arthritis and narrowing in the neck region which could be causing her leg weakness.  Because there is some nerve impingement, I would like to get the opinion of neurosurgery.  Pls refer to [facility], if patient agreeable.

## 2021-09-22 ENCOUNTER — Ambulatory Visit (HOSPITAL_BASED_OUTPATIENT_CLINIC_OR_DEPARTMENT_OTHER): Payer: Medicare Other | Admitting: Cardiology

## 2021-09-24 DIAGNOSIS — I10 Essential (primary) hypertension: Secondary | ICD-10-CM | POA: Diagnosis not present

## 2021-09-24 DIAGNOSIS — M542 Cervicalgia: Secondary | ICD-10-CM | POA: Diagnosis not present

## 2021-09-24 DIAGNOSIS — M4802 Spinal stenosis, cervical region: Secondary | ICD-10-CM | POA: Diagnosis not present

## 2021-09-24 DIAGNOSIS — M4712 Other spondylosis with myelopathy, cervical region: Secondary | ICD-10-CM | POA: Diagnosis not present

## 2021-10-02 ENCOUNTER — Telehealth: Payer: Medicare Other | Admitting: Nurse Practitioner

## 2021-10-02 DIAGNOSIS — U071 COVID-19: Secondary | ICD-10-CM | POA: Diagnosis not present

## 2021-10-02 MED ORDER — NIRMATRELVIR/RITONAVIR (PAXLOVID)TABLET: 3.0000 | ORAL_TABLET | Freq: Two times a day (BID) | ORAL | 0 refills | Status: AC

## 2021-10-02 NOTE — Patient Instructions (Signed)
Regina Marsh, thank you for joining Claiborne Rigg, NP for today's virtual visit.  While this provider is not your primary care provider (PCP), if your PCP is located in our provider database this encounter information will be shared with them immediately following your visit.  Consent: (Patient) Regina Marsh provided verbal consent for this virtual visit at the beginning of the encounter.  Current Medications:  Current Outpatient Medications:    nirmatrelvir/ritonavir EUA (PAXLOVID) 20 x 150 MG & 10 x 100MG  TABS, Take 3 tablets by mouth 2 (two) times daily for 5 days. (Take nirmatrelvir 150 mg two tablets twice daily for 5 days and ritonavir 100 mg one tablet twice daily for 5 days) Patient GFR is 60, Disp: 30 tablet, Rfl: 0   acetaminophen (TYLENOL) 325 MG tablet, Take 325 mg by mouth every 6 (six) hours as needed for moderate pain., Disp: , Rfl:    Cholecalciferol (VITAMIN D3) 50 MCG (2000 UT) TABS, Take 2,000 Units by mouth daily. Take 2 tablets daily, Disp: , Rfl:    diphenhydrAMINE HCl (BENADRYL ALLERGY PO), Take 0.5-1 tablets by mouth daily as needed (allergy)., Disp: , Rfl:    famotidine (PEPCID) 20 MG tablet, Take 1 tablet (20 mg total) by mouth 2 (two) times daily., Disp: 60 tablet, Rfl: 5   gabapentin (NEURONTIN) 100 MG capsule, Take 100 mg by mouth daily as needed (pain). (Patient not taking: Reported on 08/27/2021), Disp: , Rfl:    Garlic (GARLIQUE PO), Take 1 tablet by mouth daily., Disp: , Rfl:    hydrochlorothiazide (HYDRODIURIL) 25 MG tablet, Take 25 mg by mouth daily., Disp: , Rfl:    Lactase (LACTAID PO), Take 1 tablet by mouth daily as needed (lactose intolerance)., Disp: , Rfl:    Lactobacillus (PROBIOTIC ACIDOPHILUS PO), Take 1 tablet by mouth daily., Disp: , Rfl:    Melatonin 500 MCG TBDP, Take 1 tablet by mouth daily as needed (sleep)., Disp: , Rfl:    Menatetrenone (VITAMIN K2) 100 MCG TABS, Take 100 mcg by mouth daily., Disp: , Rfl:    OVER THE COUNTER  MEDICATION, Take 1,500 mg by mouth daily. Beet root, Disp: , Rfl:    pantoprazole (PROTONIX) 40 MG tablet, Take 1 tablet (40 mg total) by mouth daily. (Patient not taking: Reported on 08/27/2021), Disp: 30 tablet, Rfl: 3   Propylene Glycol (SYSTANE BALANCE OP), Place 1 drop into both eyes 3 (three) times daily as needed (dry eyes)., Disp: , Rfl:    Rectal Thermometer MISC, by Does not apply route., Disp: , Rfl:    Simethicone (GAS-X PO), Take 1 tablet by mouth daily as needed (flatulance)., Disp: , Rfl:    thyroid (ARMOUR) 15 MG tablet, Take 15 mg by mouth daily., Disp: , Rfl:    Medications ordered in this encounter:  Meds ordered this encounter  Medications   nirmatrelvir/ritonavir EUA (PAXLOVID) 20 x 150 MG & 10 x 100MG  TABS    Sig: Take 3 tablets by mouth 2 (two) times daily for 5 days. (Take nirmatrelvir 150 mg two tablets twice daily for 5 days and ritonavir 100 mg one tablet twice daily for 5 days) Patient GFR is 60    Dispense:  30 tablet    Refill:  0    Order Specific Question:   Supervising Provider    Answer:   08/29/2021 [[id number]]     *If you need refills on other medications prior to your next appointment, please contact your pharmacy*  Follow-Up: Call back or seek  an in-person evaluation if the symptoms worsen or if the condition fails to improve as anticipated.  Other Instructions Please keep well-hydrated and get plenty of rest. Start a saline nasal rinse to flush out your nasal passages. You can use plain Mucinex to help thin congestion. If you have a humidifier, running in the bedroom at night. I want you to start OTC vitamin D3 1000 units daily, vitamin C 1000 mg daily, and a zinc supplement. Please take prescribed medications as directed.     If PCR test positive, please see quarantine instructions below. I also want you to message me via MyChart, using the separate message I have sent you. This way you can communicate directly with me.    You were to quarantine  for 5 days from onset of your symptoms.  After day 5, if you have had no fever and you are feeling better, you can end quarantine but need to mask for an additional 5 days. After day 5 if you have a fever or are having significant symptoms, please quarantine for full 10 days.   If you note any worsening of symptoms, any significant shortness of breath or any chest pain, please seek ER evaluation ASAP.  Please do not delay care!    If you have been instructed to have an in-person evaluation today at a local Urgent Care facility, please use the link below. It will take you to a list of all of our available [facility], including address, phone number and hours of operation. Please do not delay care.  [facility]  If you or a family member do not have a primary care provider, use the link below to schedule a visit and establish care. When you choose a [facility] primary care physician or advanced practice provider, you gain a long-term partner in health. Find a Primary Care Provider  Learn more about [facility]'s in-office and virtual care options: [facility] - Get Care Now

## 2021-10-02 NOTE — Progress Notes (Signed)
Virtual Visit Consent   Regina Marsh, you are scheduled for a virtual visit with a [facility] provider today.     Just as with appointments in the office, your consent must be obtained to participate.  Your consent will be active for this visit and any virtual visit you may have with one of our providers in the next 365 days.     If you have a MyChart account, a copy of this consent can be sent to you electronically.  All virtual visits are billed to your insurance company just like a traditional visit in the office.    As this is a virtual visit, video technology does not allow for your provider to perform a traditional examination.  This may limit your provider's ability to fully assess your condition.  If your provider identifies any concerns that need to be evaluated in person or the need to arrange testing (such as labs, EKG, etc.), we will make arrangements to do so.     Although advances in technology are sophisticated, we cannot ensure that it will always work on either your end or our end.  If the connection with a video visit is poor, the visit may have to be switched to a telephone visit.  With either a video or telephone visit, we are not always able to ensure that we have a secure connection.     I need to obtain your verbal consent now.   Are you willing to proceed with your visit today?    Regina Marsh has provided verbal consent on 10/02/2021 for a virtual visit (video or telephone).   Claiborne Rigg, NP   Date: 10/02/2021 11:14 AM   Virtual Visit via Video Note   I, Claiborne Rigg, connected with  Regina Marsh, [id number]) on 10/02/21 at 11:45 AM EST by a video-enabled telemedicine application and verified that I am speaking with the correct person using two identifiers.  Location: Patient: Virtual Visit Location Patient: Home Provider: Virtual Visit Location Provider: Home Office   I discussed the limitations of evaluation and management by  telemedicine and the availability of in person appointments. The patient expressed understanding and agreed to proceed.    History of Present Illness: Regina Marsh is a 75 y.o. who identifies as a female who was assigned female at birth, and is being seen today for COVID POSITIVE HOME TEST.  HPI:  Regina Marsh tested positive for COVID this morning with a home antigen test. Current symptoms include:  Stuffy nose, sore throat, headache, and generalized body aches. Denies fever.    Problems:  Patient Active Problem List   Diagnosis Date Noted   Chest discomfort 04/08/2021   Mixed hyperlipidemia 03/09/2021   Chest pain of uncertain etiology 03/09/2021   Primary hypertension 01/07/2021   Rash and other nonspecific skin eruption 12/10/2020   Drug reaction 12/10/2020   Lactose intolerance 12/10/2020   Endocrine disorder, unspecified 11/27/2020   Sjogren's syndrome (HCC) 06/23/2020   Essential hypertension, benign 06/23/2020   Episodic lightheadedness 06/23/2020    Allergies:  Allergies  Allergen Reactions   African Mango [Irvingia Gabonensis]    Allegra [Fexofenadine] Other (See Comments)    Abdominal pain   Amlodipine     lightheadedness   Atorvastatin Other (See Comments)   Cetirizine Hcl Other (See Comments)   Chlorthalidone     lightheadedness   Lactose Intolerance (Gi) Other (See Comments)    Upset stomach   Lisinopril-Hydrochlorothiazide     Light headed  Loratadine Other (See Comments)   Penicillins Other (See Comments)   Pravastatin Other (See Comments)   Simvastatin Other (See Comments)   Sulfa Antibiotics Other (See Comments)   Valsartan Other (See Comments)    Myalgias, cough, tongue swelling   Spironolactone Rash   Medications:  Current Outpatient Medications:    nirmatrelvir/ritonavir EUA (PAXLOVID) 20 x 150 MG & 10 x 100MG  TABS, Take 3 tablets by mouth 2 (two) times daily for 5 days. (Take nirmatrelvir 150 mg two tablets twice daily for 5 days and  ritonavir 100 mg one tablet twice daily for 5 days) Patient GFR is 60, Disp: 30 tablet, Rfl: 0   acetaminophen (TYLENOL) 325 MG tablet, Take 325 mg by mouth every 6 (six) hours as needed for moderate pain., Disp: , Rfl:    Cholecalciferol (VITAMIN D3) 50 MCG (2000 UT) TABS, Take 2,000 Units by mouth daily. Take 2 tablets daily, Disp: , Rfl:    diphenhydrAMINE HCl (BENADRYL ALLERGY PO), Take 0.5-1 tablets by mouth daily as needed (allergy)., Disp: , Rfl:    famotidine (PEPCID) 20 MG tablet, Take 1 tablet (20 mg total) by mouth 2 (two) times daily., Disp: 60 tablet, Rfl: 5   gabapentin (NEURONTIN) 100 MG capsule, Take 100 mg by mouth daily as needed (pain). (Patient not taking: Reported on 08/27/2021), Disp: , Rfl:    Garlic (GARLIQUE PO), Take 1 tablet by mouth daily., Disp: , Rfl:    hydrochlorothiazide (HYDRODIURIL) 25 MG tablet, Take 25 mg by mouth daily., Disp: , Rfl:    Lactase (LACTAID PO), Take 1 tablet by mouth daily as needed (lactose intolerance)., Disp: , Rfl:    Lactobacillus (PROBIOTIC ACIDOPHILUS PO), Take 1 tablet by mouth daily., Disp: , Rfl:    Melatonin 500 MCG TBDP, Take 1 tablet by mouth daily as needed (sleep)., Disp: , Rfl:    Menatetrenone (VITAMIN K2) 100 MCG TABS, Take 100 mcg by mouth daily., Disp: , Rfl:    OVER THE COUNTER MEDICATION, Take 1,500 mg by mouth daily. Beet root, Disp: , Rfl:    pantoprazole (PROTONIX) 40 MG tablet, Take 1 tablet (40 mg total) by mouth daily. (Patient not taking: Reported on 08/27/2021), Disp: 30 tablet, Rfl: 3   Propylene Glycol (SYSTANE BALANCE OP), Place 1 drop into both eyes 3 (three) times daily as needed (dry eyes)., Disp: , Rfl:    Rectal Thermometer MISC, by Does not apply route., Disp: , Rfl:    Simethicone (GAS-X PO), Take 1 tablet by mouth daily as needed (flatulance)., Disp: , Rfl:    thyroid (ARMOUR) 15 MG tablet, Take 15 mg by mouth daily., Disp: , Rfl:   Observations/Objective: Patient is well-developed, well-nourished in no  acute distress.  Resting comfortably  at home.  Head is normocephalic, atraumatic.  No labored breathing.  Speech is clear and coherent with logical content.  Patient is alert and oriented at baseline.    Assessment and Plan: 1. Positive self-administered antigen test for COVID-19 - nirmatrelvir/ritonavir EUA (PAXLOVID) 20 x 150 MG & 10 x 100MG  TABS; Take 3 tablets by mouth 2 (two) times daily for 5 days. (Take nirmatrelvir 150 mg two tablets twice daily for 5 days and ritonavir 100 mg one tablet twice daily for 5 days) Patient GFR is 60  Dispense: 30 tablet; Refill: 0    Follow Up Instructions: I discussed the assessment and treatment plan with the patient. The patient was provided an opportunity to ask questions and all were answered. The patient agreed with  the plan and demonstrated an understanding of the instructions.  A copy of instructions were sent to the patient via MyChart unless otherwise noted below.     The patient was advised to call back or seek an in-person evaluation if the symptoms worsen or if the condition fails to improve as anticipated.  Time:  I spent 10 minutes with the patient via telehealth technology discussing the above problems/concerns.    Claiborne Rigg, NP

## 2021-10-05 ENCOUNTER — Telehealth: Payer: Self-pay | Admitting: Gastroenterology

## 2021-10-05 ENCOUNTER — Telehealth: Payer: Self-pay | Admitting: Allergy

## 2021-10-05 NOTE — Telephone Encounter (Signed)
Patient called seeking advice if she should come in for appointment on 10/13/21 at 8:30. Stated she tested positive for Covid on Christmas Eve and her last dose of medication is tomorrow. Please advise.

## 2021-10-05 NOTE — Telephone Encounter (Signed)
Patient has an appointment 10/12/21. She tested positive for Covid 10/02/21. She has been on Paxlovid and has her last dose tomorrow, 10/06/21. She wanted to know if she needed to reschedule her appointment for 1/3 or if Dr. Selena Batten felt comfortable seeing her.

## 2021-10-05 NOTE — Telephone Encounter (Signed)
Okay to keep appointment as long as she is feeling better. Thank you.

## 2021-10-05 NOTE — Telephone Encounter (Signed)
Please advise to seeing pt

## 2021-10-06 NOTE — Telephone Encounter (Signed)
We can switch it to my chart video visit if patient feels she may not be able to physically come to the visit.  Per current guidelines she is okay to and isolation after 5 to 10 days unless she has persistent symptoms.  Thank you

## 2021-10-06 NOTE — Telephone Encounter (Signed)
Spoke with the patient. She feels well. She was concerned about the office's level of comfort with her coming in. She is physically able and looks forward to seeing [name].

## 2021-10-11 NOTE — Progress Notes (Signed)
Follow Up Note  RE: Regina Marsh MRN: [id number] DOB: 01-Sep-1946 Date of Office Visit: 10/12/2021  Referring provider: Shon Hale, * Primary care provider: Shon Hale, MD  Chief Complaint: Rash (/)  History of Present Illness: I had the pleasure of seeing Regina Marsh for a follow up visit at the [facility] on 10/12/2021. She is a 76 y.o. female, who is being followed for rash. Her previous allergy office visit was on 04/08/2021 with Dr. Selena Batten. Today is a regular follow up visit.  Rash Still breaking in random episodes - mainly on the face and neck. Sometimes she has no rashes for 6+ weeks and then has a breakout for a few days in a row. No triggers noted.  She takes benadryl prn with good benefit.  Still taking famotidine 20mg  twice a day.  Sjogren's is about the same.   Patient saw GI and has hiatal hernia. She was prescribed PPI which she hasn't taken yet.  She is having surgery for a pinch nerve on her neck later this month. She will be wearing a neck brace.  Concerned if she's going to break out or not.   She also noted some ear fullness and used to take decongestants for this in the past but now can't take it. She wants to know is there anything else she can take for this.  Assessment and Plan: Regina Marsh is a 76 y.o. female with: Rash and other nonspecific skin eruption Past history - In December 2021 she was started on spironolactone and 2 weeks afterwards noted facial rash/edema which traveled down to her body, difficulty breathing and mouth sores. Sometimes feel flushed and has diarrhea. Stopped the above medication but had about 15 additional episodes the last 2-3 months. Benadryl seems to help. Zyrtec causes dizziness and Claritin worsens her Sjogren's symptoms. Did not tolerate allegra. Bloodwork unremarkable except elevated CU. Green bell peppers, roma tomatoes seem to flare symptoms.  Interim history -  Random flares with no  triggers noted. Treats with benadryl prn with good benefit.  Continue famotidine 20mg  twice a day. Keep track of episodes and take pictures when it flares. Continue to avoid foods that are bothersome - green bell peppers, Roma tomatoes. For mild symptoms you can take over the counter antihistamines such as Benadryl and monitor symptoms closely. If symptoms worsen or if you have severe symptoms including breathing issues, throat closure, significant swelling, whole body hives, severe diarrhea and vomiting, lightheadedness then inject epinephrine and seek immediate medical care afterwards.  Sensation of fullness in right ear Use Nasacort (triamcinolone) nasal spray 1 spray per nostril 1-2 times a day as needed for nasal congestion. Sample given.  Primary hypertension Follow up with PCP/cardiology regarding your blood pressure.   Drug reaction Continue to avoid medications on your allergy list.  Okay to take pantoprazole from allergy standpoint.   Return in about 6 months (around 04/11/2022).  No orders of the defined types were placed in this encounter.  Lab Orders  No laboratory test(s) ordered today    Diagnostics: None.   Medication List:  Current Outpatient Medications  Medication Sig Dispense Refill   acetaminophen (TYLENOL) 325 MG tablet Take 325 mg by mouth every 6 (six) hours as needed for moderate pain.     Cholecalciferol (VITAMIN D3) 50 MCG (2000 UT) TABS Take 2,000 Units by mouth daily. Take 2 tablets daily     diphenhydrAMINE HCl (BENADRYL ALLERGY PO) Take 0.5-1 tablets by mouth daily as  needed (allergy).     famotidine (PEPCID) 20 MG tablet Take 1 tablet (20 mg total) by mouth 2 (two) times daily. 60 tablet 5   Garlic (GARLIQUE PO) Take 1 tablet by mouth daily.     hydrochlorothiazide (HYDRODIURIL) 25 MG tablet Take 25 mg by mouth daily.     Lactase (LACTAID PO) Take 1 tablet by mouth daily as needed (lactose intolerance).     Lactobacillus (PROBIOTIC ACIDOPHILUS PO)  Take 1 tablet by mouth daily.     Melatonin 500 MCG TBDP Take 1 tablet by mouth daily as needed (sleep).     Menatetrenone (VITAMIN K2) 100 MCG TABS Take 100 mcg by mouth daily.     OVER THE COUNTER MEDICATION Take 1,500 mg by mouth daily. Beet root     Propylene Glycol (SYSTANE BALANCE OP) Place 1 drop into both eyes 3 (three) times daily as needed (dry eyes).     Simethicone (GAS-X PO) Take 1 tablet by mouth daily as needed (flatulance).     pantoprazole (PROTONIX) 40 MG tablet Take 1 tablet (40 mg total) by mouth daily. (Patient not taking: Reported on 08/27/2021) 30 tablet 3   Rectal Thermometer MISC by Does not apply route.     thyroid (ARMOUR) 15 MG tablet Take 15 mg by mouth daily.     No current facility-administered medications for this visit.   Allergies: Allergies  Allergen Reactions   African Mango [Irvingia Gabonensis]    Allegra [Fexofenadine] Other (See Comments)    Abdominal pain   Amlodipine     lightheadedness   Atorvastatin Other (See Comments)   Cetirizine Hcl Other (See Comments)   Chlorthalidone     lightheadedness   Lactose Intolerance (Gi) Other (See Comments)    Upset stomach   Lisinopril-Hydrochlorothiazide     Light headed   Loratadine Other (See Comments)   Penicillins Other (See Comments)   Pravastatin Other (See Comments)   Simvastatin Other (See Comments)   Sulfa Antibiotics Other (See Comments)   Valsartan Other (See Comments)    Myalgias, cough, tongue swelling   Spironolactone Rash   I reviewed her past medical history, social history, family history, and environmental history and no significant changes have been reported from her previous visit.  Review of Systems  Constitutional:  Negative for appetite change, chills, fever and unexpected weight change.  HENT:  Negative for congestion and rhinorrhea.   Eyes:  Negative for itching.  Respiratory:  Negative for cough, chest tightness, shortness of breath and wheezing.   Cardiovascular:   Negative for chest pain.  Gastrointestinal:  Negative for abdominal pain and diarrhea.  Genitourinary:  Negative for difficulty urinating.  Skin:  Positive for rash.  Allergic/Immunologic: Negative for environmental allergies.  Neurological:  Negative for headaches.   Objective: BP (!) 160/86    Pulse 81    Temp 98.9 F (37.2 C) (Temporal)    Resp 16    SpO2 98%  There is no height or weight on file to calculate BMI. Physical Exam Vitals and nursing note reviewed.  Constitutional:      Appearance: Normal appearance. She is well-developed.  HENT:     Head: Normocephalic and atraumatic.     Right Ear: Tympanic membrane and external ear normal.     Left Ear: Tympanic membrane and external ear normal.     Nose: Nose normal.     Mouth/Throat:     Pharynx: Oropharynx is clear.  Eyes:     Conjunctiva/sclera: Conjunctivae normal.  Cardiovascular:     Rate and Rhythm: Normal rate and regular rhythm.     Heart sounds: Normal heart sounds. No murmur heard.   No friction rub. No gallop.  Pulmonary:     Effort: Pulmonary effort is normal.     Breath sounds: Normal breath sounds. No wheezing, rhonchi or rales.  Musculoskeletal:     Cervical back: Neck supple.  Skin:    General: Skin is warm.     Findings: No rash.  Neurological:     Mental Status: She is alert and oriented to person, place, and time.  Psychiatric:        Behavior: Behavior normal.   Previous notes and tests were reviewed. The plan was reviewed with the patient/family, and all questions/concerned were addressed.  It was my pleasure to see Regina Marsh today and participate in her care. Please feel free to contact me with any questions or concerns.  Sincerely,  Wyline Mood, DO Allergy & Immunology  [facility] office: [phone] [facility] office: [phone]

## 2021-10-12 ENCOUNTER — Other Ambulatory Visit: Payer: Self-pay

## 2021-10-12 ENCOUNTER — Ambulatory Visit (INDEPENDENT_AMBULATORY_CARE_PROVIDER_SITE_OTHER): Payer: Medicare Other | Admitting: Allergy

## 2021-10-12 ENCOUNTER — Encounter: Payer: Self-pay | Admitting: Allergy

## 2021-10-12 VITALS — BP 160/86 | HR 81 | Temp 98.9°F | Resp 16

## 2021-10-12 DIAGNOSIS — H938X1 Other specified disorders of right ear: Secondary | ICD-10-CM

## 2021-10-12 DIAGNOSIS — I10 Essential (primary) hypertension: Secondary | ICD-10-CM

## 2021-10-12 DIAGNOSIS — E739 Lactose intolerance, unspecified: Secondary | ICD-10-CM

## 2021-10-12 DIAGNOSIS — R21 Rash and other nonspecific skin eruption: Secondary | ICD-10-CM

## 2021-10-12 DIAGNOSIS — T50905D Adverse effect of unspecified drugs, medicaments and biological substances, subsequent encounter: Secondary | ICD-10-CM

## 2021-10-12 NOTE — Assessment & Plan Note (Signed)
Past history - In December 2021 she was started on spironolactone and 2 weeks afterwards noted facial rash/edema which traveled down to her body, difficulty breathing and mouth sores. Sometimes feel flushed and has diarrhea. Stopped the above medication but had about 15 additional episodes the last 2-3 months. Benadryl seems to help. Zyrtec causes dizziness and Claritin worsens her Sjogren's symptoms. Did not tolerate allegra. Bloodwork unremarkable except elevated CU. Green bell peppers, roma tomatoes seem to flare symptoms.  Interim history -  Random flares with no triggers noted. Treats with benadryl prn with good benefit.   Continue famotidine 20mg  twice a day.  Keep track of episodes and take pictures when it flares.  Continue to avoid foods that are bothersome - green bell peppers, Roma tomatoes.  For mild symptoms you can take over the counter antihistamines such as Benadryl and monitor symptoms closely. If symptoms worsen or if you have severe symptoms including breathing issues, throat closure, significant swelling, whole body hives, severe diarrhea and vomiting, lightheadedness then inject epinephrine and seek immediate medical care afterwards.

## 2021-10-12 NOTE — Assessment & Plan Note (Signed)
•   Use Nasacort (triamcinolone) nasal spray 1 spray per nostril 1-2 times a day as needed for nasal congestion. Sample given.

## 2021-10-12 NOTE — Assessment & Plan Note (Signed)
·   Continue to avoid medications on your allergy list.   Okay to take pantoprazole from allergy standpoint.

## 2021-10-12 NOTE — Assessment & Plan Note (Signed)
·   Follow up with PCP/cardiology regarding your blood pressure.  

## 2021-10-12 NOTE — Patient Instructions (Addendum)
Rash: Continue famotidine 20mg  twice a day. Keep track of episodes and take pictures when it flares. Continue to avoid foods that are bothersome - green bell peppers, Roma tomatoes. For mild symptoms you can take over the counter antihistamines such as Benadryl and monitor symptoms closely. If symptoms worsen or if you have severe symptoms including breathing issues, throat closure, significant swelling, whole body hives, severe diarrhea and vomiting, lightheadedness then inject epinephrine and seek immediate medical care afterwards.  Ear congestion Use Nasacort (triamcinolone) nasal spray 1 spray per nostril 1-2 times a day as needed for nasal congestion. Sample given.  Lactose intolerance May use lactose free milk or take a lactaid pill right before consuming anything with dairy.  Drug allergies  Continue to avoid medications on your allergy list.   Blood pressure Follow up with PCP/cardiology regarding your blood pressure.   Hiatal hernia Okay to take pantoprazole from allergy standpoint.   Follow up in 4-6 months or sooner if needed.  Skin care recommendations  Bath time: Always use lukewarm water. AVOID very hot or cold water. Keep bathing time to 5-10 minutes. Do NOT use bubble bath. Use a mild soap and use just enough to wash the dirty areas. Do NOT scrub skin vigorously.  After bathing, pat dry your skin with a towel. Do NOT rub or scrub the skin.  Moisturizers and prescriptions:  ALWAYS apply moisturizers immediately after bathing (within 3 minutes). This helps to lock-in moisture. Use the moisturizer several times a day over the whole body. Good summer moisturizers include: Aveeno, CeraVe, Cetaphil. Good winter moisturizers include: Aquaphor, Vaseline, Cerave, Cetaphil, Eucerin, Vanicream. When using moisturizers along with medications, the moisturizer should be applied about one hour after applying the medication to prevent diluting effect of the medication or  moisturize around where you applied the medications. When not using medications, the moisturizer can be continued twice daily as maintenance.  Laundry and clothing: Avoid laundry products with added color or perfumes. Use unscented hypo-allergenic laundry products such as Tide free, Cheer free & gentle, and All free and clear.  If the skin still seems dry or sensitive, you can try double-rinsing the clothes. Avoid tight or scratchy clothing such as wool. Do not use fabric softeners or dyer sheets.

## 2021-10-13 ENCOUNTER — Encounter: Payer: Self-pay | Admitting: Gastroenterology

## 2021-10-13 ENCOUNTER — Ambulatory Visit: Payer: Medicare Other | Admitting: Gastroenterology

## 2021-10-13 VITALS — BP 170/110 | HR 82 | Ht 62.0 in | Wt 169.0 lb

## 2021-10-13 DIAGNOSIS — K224 Dyskinesia of esophagus: Secondary | ICD-10-CM

## 2021-10-13 DIAGNOSIS — K219 Gastro-esophageal reflux disease without esophagitis: Secondary | ICD-10-CM

## 2021-10-13 DIAGNOSIS — Z9889 Other specified postprocedural states: Secondary | ICD-10-CM

## 2021-10-13 MED ORDER — HYOSCYAMINE SULFATE SL 0.125 MG SL SUBL: SUBLINGUAL_TABLET | SUBLINGUAL | 6 refills | Status: DC

## 2021-10-13 MED ORDER — LANSOPRAZOLE 30 MG PO TBDD: 30.0000 mg | DELAYED_RELEASE_TABLET | Freq: Every day | ORAL | 3 refills | Status: DC

## 2021-10-13 NOTE — Patient Instructions (Signed)
We have sent Levsin SL daily as needed  We will send Lansoprazole solutab to your pharmacy   If you are age 76 or older, your body mass index should be between 23-30. Your Body mass index is 30.91 kg/m. If this is out of the aforementioned range listed, please consider follow up with your Primary Care Provider.  If you are age 75 or younger, your body mass index should be between 19-25. Your Body mass index is 30.91 kg/m. If this is out of the aformentioned range listed, please consider follow up with your Primary Care Provider.   ________________________________________________________  The [facility] providers would like to encourage you to use [facility] to communicate with providers for non-urgent requests or questions.  Due to long hold times on the telephone, sending your provider a message by [facility] may be a faster and more efficient way to get a response.  Please allow 48 business hours for a response.  Please remember that this is for non-urgent requests.  _______________________________________________________   I appreciate the  opportunity to care for you  Thank You   Marsa Aris , MD

## 2021-10-13 NOTE — Progress Notes (Signed)
Regina Marsh    [id number]    1946-02-08  Primary Care Physician:Timberlake, Anastasia Pall, MD  Referring Physician: Glenis Smoker, MD [place],  [place] [address]   Chief complaint: GERD, epigastric abdominal pain   HPI:  76 year old very pleasant female here for follow-up visit for GERD and epigastric abdominal pain.  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  She is scheduled to undergo C-spine surgery anterior approach in 2 weeks.    She continues to have intermittent episodes of chest discomfort, has had negative cardiac work-up in the past.  She may be experiencing esophageal spasm secondary to radiculopathy from nerve compression at C6-C7 level  He is taking pantoprazole daily with improvement of reflux symptoms, has occasional breakthrough heartburn and discomfort. Denies any change in bowel habits, rectal bleeding or melena.  Outpatient Encounter Medications as of 10/13/2021  Medication Sig   acetaminophen (TYLENOL) 325 MG tablet Take 325 mg by mouth every 6 (six) hours as needed for moderate pain.   Cholecalciferol (VITAMIN D3) 50 MCG (2000 UT) TABS Take 2,000 Units by mouth daily. Take 2 tablets daily   diphenhydrAMINE HCl (BENADRYL ALLERGY PO) Take 0.5-1 tablets by mouth daily as needed (allergy).   famotidine (PEPCID) 20 MG tablet Take 1 tablet (20 mg total) by mouth 2 (two) times daily.   Garlic (GARLIQUE PO) Take 1 tablet by mouth daily.   hydrochlorothiazide (HYDRODIURIL) 25 MG tablet Take 25 mg by mouth daily.   Lactase (LACTAID PO) Take 1 tablet by mouth daily as needed (lactose intolerance).   Lactobacillus (PROBIOTIC ACIDOPHILUS PO) Take 1 tablet by mouth daily.   Melatonin 500 MCG TBDP Take 1 tablet by mouth daily as needed (sleep).   Menatetrenone (VITAMIN K2) 100 MCG TABS Take 100 mcg by mouth daily.   OVER THE COUNTER MEDICATION Take 1,500 mg by mouth daily. Beet root   pantoprazole  (PROTONIX) 40 MG tablet Take 1 tablet (40 mg total) by mouth daily.   Propylene Glycol (SYSTANE BALANCE OP) Place 1 drop into both eyes 3 (three) times daily as needed (dry eyes).   Simethicone (GAS-X PO) Take 1 tablet by mouth daily as needed (flatulance).   [DISCONTINUED] Rectal Thermometer MISC by Does not apply route.   [DISCONTINUED] thyroid (ARMOUR) 15 MG tablet Take 15 mg by mouth daily.   No facility-administered encounter medications on file as of 10/13/2021.    Allergies as of 10/13/2021 - Review Complete 10/13/2021  Allergen Reaction Noted   African mango [irvingia gabonensis]  03/27/2020   Allegra [fexofenadine] Other (See Comments) 12/14/2020   Amlodipine  01/21/2021   Atorvastatin Other (See Comments) 01/06/2021   Cetirizine hcl Other (See Comments) 01/06/2021   Chlorthalidone  01/21/2021   Lactose intolerance (gi) Other (See Comments) 08/16/2021   Lisinopril-hydrochlorothiazide  01/21/2021   Loratadine Other (See Comments) 01/06/2021   Penicillins Other (See Comments) 03/27/2020   Pravastatin Other (See Comments) 01/06/2021   Simvastatin Other (See Comments) 01/06/2021   Sulfa antibiotics Other (See Comments) 04/01/2021   Valsartan Other (See Comments) 01/21/2021   Spironolactone Rash 10/28/2020    Past Medical History:  Diagnosis Date   GERD (gastroesophageal reflux disease)    Hiatal hernia    Hypertension    Hypothyroidism    Low back pain    Sjogren's disease ([place])     Past Surgical History:  Procedure Laterality Date   BREAST LUMPECTOMY Bilateral  [id number], right-2001   [place]   detached eye lid Left 2011   LAPAROSCOPY     x 2, for adhesions, 1989 and [place]  2006   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   SALIVARY GLAND SURGERY Left 1968   TONSILLECTOMY AND ADENOIDECTOMY  1951    Family History  Problem Relation Age of Onset   Hypertension Mother    Heart attack Mother    Stroke Father    Leukemia Father     Allergic rhinitis Neg Hx    Angioedema Neg Hx    Asthma Neg Hx    Atopy Neg Hx    Eczema Neg Hx    Immunodeficiency Neg Hx    Urticaria Neg Hx     Social History   Socioeconomic History   Marital status: Married    Spouse name: Not on file   Number of children: 1   Years of education: Not on file   Highest education level: Not on file  Occupational History   Occupation: retired Pharmacist, hospital  Tobacco Use   Smoking status: Never   Smokeless tobacco: Never  Vaping Use   Vaping Use: Never used  Substance and Sexual Activity   Alcohol use: Not Currently   Drug use: Not Currently   Sexual activity: Not on file  Other Topics Concern   Not on file  Social History Narrative   Right handed    Lives in a two story home    Social Determinants of Health   Financial Resource Strain: Not on file  Food Insecurity: Not on file  Transportation Needs: Not on file  Physical Activity: Not on file  Stress: Not on file  Social Connections: Not on file  Intimate Partner Violence: Not on file      Review of systems: All other review of systems negative except as mentioned in the HPI.   Physical Exam: Vitals:   10/13/21 0827  BP: (!) 170/110  Pulse: 82   Body mass index is 30.91 kg/m. Gen:      No acute distress HEENT:  sclera anicteric Abd:      soft, non-tender; no palpable masses, no distension Ext:    No edema Neuro: alert and oriented x 3 Psych: normal mood and affect  Data Reviewed:  Reviewed labs, radiology imaging, old records and pertinent past GI work up  Upper GI series August 26, 2021 1. Small type 1 hiatal hernia. 2. Widely patent distal esophageal benign mucosal ring, greater than 2 cm in diameter, unlikely to cause symptoms. 3. Mild dextroconvex lumbar scoliosis. 4. Suspected mild cardiomegaly. 5. Otherwise normal exam.  Abdominal ultrasound August 26, 2021 1. Diffuse hepatic steatosis with some focal fatty sparing along the gallbladder fossa. 2.  Exophytic 3.5 cm in long axis lesion along the right kidney lower pole has enhanced through transmission and no appreciable Doppler flow, but some faint internal echoes favoring complex cyst. If the patient has hematuria or if otherwise clinically warranted this could be characterized in further detail with renal protocol CT or MRI with and without contrast.  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 AP diameter of the canal in the midline is 8.4 mm in there is no cord compression. There is moderate foraminal narrowing at this level which could possibly have some compressive effect upon either C6 nerve.    Assessment and Plan/Recommendations:  76 year old very pleasant female with  history of Sjogren's disease, uncontrolled hypertension with history of hiatal hernia with atypical chest pain, GERD and epigastric abdominal pain   She is scheduled for C-spine surgery in 2 weeks, she has to be on liquid diet for approximately 6 weeks postop Switch to lansoprazole SoluTab 30 mg twice daily for management of GERD.  She may have difficulty swallowing capsules postoperatively after the C-spine surgery Continue antireflux measures  Use Levsin 0.125 mcg sublingual tablet as needed for esophageal spasms and discomfort  Return in 6 months  This visit required 30 minutes of patient care (this includes precharting, chart review, review of results, face-to-face time used for counseling as well as treatment plan and follow-up. The patient was provided an opportunity to ask questions and all were answered. The patient agreed with the plan and demonstrated an understanding of the instructions.  Damaris Hippo , MD    CC: Glenis Smoker, *

## 2021-10-14 ENCOUNTER — Encounter (HOSPITAL_BASED_OUTPATIENT_CLINIC_OR_DEPARTMENT_OTHER): Payer: Self-pay

## 2021-10-20 ENCOUNTER — Other Ambulatory Visit: Payer: Self-pay

## 2021-10-20 ENCOUNTER — Ambulatory Visit (HOSPITAL_BASED_OUTPATIENT_CLINIC_OR_DEPARTMENT_OTHER): Payer: Medicare Other | Admitting: Cardiology

## 2021-10-20 ENCOUNTER — Encounter (HOSPITAL_BASED_OUTPATIENT_CLINIC_OR_DEPARTMENT_OTHER): Payer: Self-pay | Admitting: Cardiology

## 2021-10-20 VITALS — BP 194/98 | HR 68 | Ht 62.0 in | Wt 169.5 lb

## 2021-10-20 DIAGNOSIS — Z01810 Encounter for preprocedural cardiovascular examination: Secondary | ICD-10-CM

## 2021-10-20 DIAGNOSIS — I2510 Atherosclerotic heart disease of native coronary artery without angina pectoris: Secondary | ICD-10-CM

## 2021-10-20 DIAGNOSIS — I10 Essential (primary) hypertension: Secondary | ICD-10-CM | POA: Diagnosis not present

## 2021-10-20 DIAGNOSIS — R42 Dizziness and giddiness: Secondary | ICD-10-CM | POA: Diagnosis not present

## 2021-10-20 DIAGNOSIS — Z01818 Encounter for other preprocedural examination: Secondary | ICD-10-CM | POA: Diagnosis not present

## 2021-10-20 DIAGNOSIS — E782 Mixed hyperlipidemia: Secondary | ICD-10-CM

## 2021-10-20 NOTE — Patient Instructions (Signed)
Medication Instructions:  Your Physician recommend you continue on your current medication as directed.    *If you need a refill on your cardiac medications before your next appointment, please call your pharmacy*   Lab Work: None ordered today   Testing/Procedures: None ordered today   Follow-Up: At [facility], you and your health needs are our priority.  As part of our continuing mission to provide you with exceptional heart care, we have created designated Provider Care Teams.  These Care Teams include your primary Cardiologist (physician) and Advanced Practice Providers (APPs -  Physician Assistants and Nurse Practitioners) who all work together to provide you with the care you need, when you need it.  We recommend signing up for the patient portal called "MyChart".  Sign up information is provided on this After Visit Summary.  MyChart is used to connect with patients for Virtual Visits (Telemedicine).  Patients are able to view lab/test results, encounter notes, upcoming appointments, etc.  Non-urgent messages can be sent to your provider as well.   To learn more about what you can do with MyChart, go to [email or web address].    Your next appointment:   3-4 month(s)  The format for your next appointment:   In Person  Provider:   Bridgette Christopher, MD{         

## 2021-10-20 NOTE — Progress Notes (Incomplete)
Cardiology Office Note:    Date:  10/20/2021   ID:  Regina Marsh, DOB [birth year], MRN [id number]  PCP:  Glenis Smoker, MD  Cardiologist:  Buford Dresser, MD  Referring MD: Glenis Smoker, *   CC: follow up  History of Present Illness:    Regina Marsh is a 76 y.o. female with a hx of Sjogren's, hypertension who is seen for follow up today. 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  Cardiac history:  metanephrines that are within normal limits (metanephrine 48, normetanephrine 141). ER note from 03/27/20. Noted to have elevated blood pressure after she had nasal packing for a nosebleed. No associated symptoms. BP noted as 214/91 at that time.   Today: She was seen in the ED 08/16/2021 after waking that morning with dizziness and gait ataxia. Her blood pressure was 232/88. She messaged the office 08/18/2021 to report this incident, and noted that a CT scan found a blockage, but an MRI showed no damage.  She is scheduled for cervical disk surgery 11/03/2021. She is on a liquid diet until further notice, and was instructed to hold aspirin and D3. During previous surgeries she had issues with low blood pressure. Also, she is nervous about having a long, difficult recovery.  Recently she was infected with COVID, her main symptom was fatigue. Since recovering from [place] she has been suffering from recurrent headaches that she has tried treating with tylenol and advil. She was treated with Paxlovid, and is fully vaccinated. She plans on staying quarantined until her surgery.  She denies any palpitations, chest pain, or shortness of breath. No syncope, orthopnea, PND, or lower extremity edema.   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   Cholecalciferol (VITAMIN D3) 50 MCG (2000 UT) TABS Take 2,000 Units by mouth daily. Take 2 tablets daily   diphenhydrAMINE HCl (BENADRYL ALLERGY PO) Take 0.5-1 tablets by mouth daily as needed (allergy).   famotidine (PEPCID) 20 MG tablet Take 1 tablet (20 mg total) by mouth 2 (two) times daily.   Garlic (GARLIQUE PO) Take 1 tablet by mouth daily.   hydrochlorothiazide (HYDRODIURIL) 25 MG tablet Take 25 mg by mouth daily.   Lactase (LACTAID PO) Take 1 tablet by mouth daily as needed (lactose intolerance).   Lactobacillus (PROBIOTIC ACIDOPHILUS PO) Take 1 tablet by mouth daily.   lansoprazole (PREVACID SOLUTAB) 30 MG disintegrating tablet Take 1 tablet (30 mg total) by mouth daily at 12 noon.   Melatonin 500 MCG TBDP Take 1 tablet by mouth daily as needed (sleep).   Menatetrenone (VITAMIN K2)  100 MCG TABS Take 100 mcg by mouth daily.   OVER THE COUNTER MEDICATION Take 1,500 mg by mouth daily. Beet root   pantoprazole (PROTONIX) 40 MG tablet Take 1 tablet (40 mg total) by mouth daily.   Propylene Glycol (SYSTANE BALANCE OP) Place 1 drop into both eyes 3 (three) times daily as needed (dry eyes).   Simethicone (GAS-X PO) Take 1 tablet by mouth daily as needed (flatulance).   Hyoscyamine Sulfate SL (LEVSIN/SL) 0.125 MG SUBL Use daily as needed (Patient not taking: Reported on 10/20/2021)   No current facility-administered medications on file  prior to visit.     Allergies:   African mango [irvingia gabonensis], Allegra [fexofenadine], Amlodipine, Atorvastatin, Cetirizine hcl, Chlorthalidone, Lactose intolerance (gi), Lisinopril-hydrochlorothiazide, Loratadine, Penicillins, Pravastatin, Simvastatin, Sulfa antibiotics, Valsartan, and Spironolactone   Social History   Tobacco Use   Smoking status: Never   Smokeless tobacco: Never  Vaping Use   Vaping Use: Never used  Substance Use Topics   Alcohol use: Not Currently   Drug use: Not Currently    Family History: No family history of premature CAD  ROS:   Please see the history of present illness.   (+) Headaches Additional pertinent ROS otherwise unremarkable.   EKGs/Labs/Other Studies Reviewed:    The following studies were reviewed today:  CTA Head/Neck 08/16/2021: FINDINGS: CT HEAD   Brain: There is no acute intracranial hemorrhage, mass effect, or edema. Gray-white differentiation is preserved. There is no extra-axial fluid collection. Ventricles and sulci are within normal limits in size configuration. Patchy low-density in the supratentorial white matter is nonspecific but may reflect chronic microvascular ischemic changes. 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   Vascular: There is atherosclerotic calcification at the skull base.   Skull: Calvarium is unremarkable.   Sinuses/Orbits: No acute finding.   Other: None.   Review of the MIP images confirms the above findings   CTA NECK   Aortic arch: Great vessel origins are patent.   Right carotid system: Patent. Minimal calcified plaque along the proximal internal carotid without stenosis.   Left carotid system: Patent. Eccentric noncalcified plaque along the common carotid with less than 50% stenosis. Noncalcified plaque along the proximal internal carotid with less than 50% stenosis.   Vertebral arteries: Patent and codominant.  No  stenosis.   Skeleton: Degenerative changes of the cervical spine, greatest at C5-C6.   Other neck: No acute abnormality. Fatty atrophy or resected left submandibular gland.   Upper chest: No apical lung mass.   Review of the MIP images confirms the above findings   CTA HEAD   Suboptimal arterial evaluation due to venous enhancement.   Anterior circulation: Intracranial internal carotid arteries are patent. There is noncalcified plaque along the petrous portion and inferior genu with mild stenosis. Calcified plaque along the cavernous segment with mild stenosis.   Occlusion of the right M1 MCA. There is reconstitution at the bifurcation. Left middle cerebral artery is patent with what appears to be an early bifurcation probable superimposed stenosis.   Anterior cerebral arteries are patent. Anterior communicating artery is present. Suspect stenosis of the proximal left A1 ACA.   Posterior circulation: Intracranial vertebral arteries are patent. Moderate stenosis on the right. Moderate to marked stenosis on the left just beyond PICA origin. There is likely stenosis at the left PICA origin. Basilar artery is patent with noncalcified plaque. Posterior cerebral arteries are poorly visualized and likely diffusely stenotic and irregular.  Venous sinuses: Patent as allowed by contrast bolus timing.   Review of the MIP images confirms the above findings   IMPRESSION: There is no acute intracranial hemorrhage or evidence of acute infarction. ASPECT score is 10.   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   Intracranial atherosclerosis. Moderate stenosis right vertebral artery and moderate to marked stenosis left vertebral artery just beyond PICA origin. There is stenosis at the left PICA origin. The posterior cerebral arteries are poorly visualized and likely diffusely irregular and stenotic. Probable early left MCA bifurcation  with superimposed stenosis. Stenosis of proximal left A1 ACA.   No hemodynamically significant stenosis in the neck. Noncalcified plaque at the left ICA origin with less than 50% stenosis.   Initial results were called by telephone at the time of interpretation on 08/16/2021 at 10:32 am to provider [facility] , who verbally acknowledged these results.  Calcium score 05/07/21 Coronary calcium score of 27.7. This was 42nd percentile for age-, race-, and sex-matched controls.  Monitor 09/21/20 14 days of data recorded on Zio monitor. Patient had a min HR of 45 bpm, max HR of 184 bpm, and avg HR of 68 bpm. Predominant underlying rhythm was Sinus Rhythm. No VT, atrial fibrillation, high degree block, or pauses noted. 4 Supraventricular Tachycardia runs occurred, the run with the fastest interval lasting 6 beats with a max rate of 184 bpm, the longest lasting 11 beats with an avg rate of 109 bpm. Isolated atrial and ventricular ectopy was rare (<1%). There were 19 triggered events. These are all sinus rhythm based, 3 with PAC and 1 with PVC.  Ambulatory 24 hour BP monitoring 09/21/20 24 hour ambulatory blood pressure monitor reviewed. Remained hypertensive throughout study. Overall mean 176/79, HR 69. Systolic dipped less than 1% with sleep. Max reading 201/98.    EKG:  EKG is personally reviewed.   10/20/2021: EKG was not ordered. 08/16/2021 (ED): Sinus rhythm, rate 72 bpm, Borderline repolarization abnormality, When compared to prior, similar appearance. No STEMI. 06/08/2021 Sinus rhythm with 1st degree AV block, 86 bpm 03/09/2021: EKG was not ordered. 06/23/20: NSR at 83 bpm with poor r wave progression  Recent Labs: 08/16/2021: ALT 16; BUN 15; Creatinine, Ser 0.98; Hemoglobin 13.2; Platelets 241; Potassium 3.1; Sodium 141; TSH 1.302   Recent Lipid Panel No results found for: CHOL, TRIG, HDL, CHOLHDL, VLDL, LDLCALC, LDLDIRECT  Physical Exam:    VS:  BP (!) 194/98 (BP Location: Left  Arm, Patient Position: Sitting, Cuff Size: Large)    Pulse 68    Ht '5\' 2"'  (1.575 m)    Wt 169 lb 8 oz (76.9 kg)    SpO2 98%    BMI 31.00 kg/m     Wt Readings from Last 3 Encounters:  10/20/21 169 lb 8 oz (76.9 kg)  10/13/21 169 lb (76.7 kg)  08/27/21 168 lb (76.2 kg)    GEN: Well nourished, well developed in no acute distress HEENT: Normal, moist mucous membranes NECK: No JVD CARDIAC: regular rhythm, normal S1 and S2, no rubs or gallops. No murmur. VASCULAR: Radial and DP pulses 2+ bilaterally. No carotid bruits RESPIRATORY:  Clear to auscultation without rales, wheezing or rhonchi  ABDOMEN: Soft, non-tender, non-distended MUSCULOSKELETAL:  Ambulates independently SKIN: Warm and dry, no edema NEUROLOGIC:  Alert and oriented x 3. No focal neuro deficits noted. PSYCHIATRIC:  Normal affect    ASSESSMENT:    No diagnosis found.  PLAN:    Hypertension:  -continues to  be above goal -had rash on spironolactone, stopped -has felt terrible on beta blockers in the past -amlodipine associated with lightheadedness, stomach pain, nausea -had face/tongue swelling on minoxidil -lightheadedness on lisinopril -lightheadedness, nausea, stomach pain on valsartan -thus far, tolerating HCTZ -lab workup for secondary causes summarized in HPI -we reviewed guidelines and goals today. I do not expect that she will ever be within the guidelines of <130/80. She feels better on no medications, but her blood pressure can be very elevated. I would aim for 408X systolic with 44Y-18H diastolic. This seems to be a compromise of how she feels and avoiding very elevated number. -her blood pressure cuff at home has been highly variable. Recommend she bring this to her upcoming PCP appt to check -overall very limited options for management given her reactions to medications  Chest pain: improved with reflux treatment, still occurs occasionally, better with gas-x or benadryl -she will talk to Dr. Lindell Noe about  a GI referral  Mixed hyperlipidemia -She has not tolerated statins. Discussed zetia, she does not want to try any medications. We did discuss long term risk of heart attack and stroke and how we use medications to optimize this. She feels she is at her limit and cannot do anything more in terms of medications right now.  -calcium score 05/07/21 showed Ca score=27.7  Cardiac risk counseling and prevention recommendations: -recommend heart healthy/Mediterranean diet, with whole grains, fruits, vegetable, fish, lean meats, nuts, and olive oil. Limit salt. -recommend moderate walking, 3-5 times/week for 30-50 minutes each session. Aim for at least 150 minutes.week. Goal should be pace of 3 miles/hours, or walking 1.5 miles in 30 minutes -recommend avoidance of tobacco products. Avoid excess alcohol.  Plan for follow up: I will see her back in 4 mos or sooner as needed.  Buford Dresser, MD, PhD, [facility]    Medication Adjustments/Labs and Tests Ordered: Current medicines are reviewed at length with the patient today.  Concerns regarding medicines are outlined above.   No orders of the defined types were placed in this encounter.  No orders of the defined types were placed in this encounter.  There are no Patient Instructions on file for this visit.   I,Mathew Stumpf,acting as a Education administrator for [employer], MD.,have documented all relevant documentation on the behalf of Buford Dresser, MD,as directed by  Buford Dresser, MD while in the presence of Buford Dresser, MD.  ***  Signed, Buford Dresser, MD PhD 10/20/2021  Derma

## 2021-10-27 ENCOUNTER — Ambulatory Visit: Payer: Medicare Other | Admitting: Neurology

## 2021-11-03 DIAGNOSIS — M542 Cervicalgia: Secondary | ICD-10-CM | POA: Diagnosis not present

## 2021-11-03 DIAGNOSIS — M4712 Other spondylosis with myelopathy, cervical region: Secondary | ICD-10-CM | POA: Diagnosis not present

## 2021-11-03 DIAGNOSIS — M4802 Spinal stenosis, cervical region: Secondary | ICD-10-CM | POA: Diagnosis not present

## 2021-11-03 HISTORY — PX: CERVICAL SPINE SURGERY: SHX589

## 2021-11-15 ENCOUNTER — Other Ambulatory Visit: Payer: Self-pay | Admitting: Cardiology

## 2021-11-25 DIAGNOSIS — M4712 Other spondylosis with myelopathy, cervical region: Secondary | ICD-10-CM | POA: Diagnosis not present

## 2021-12-14 ENCOUNTER — Ambulatory Visit: Payer: Medicare Other | Admitting: "Endocrinology

## 2021-12-14 ENCOUNTER — Encounter: Payer: Self-pay | Admitting: "Endocrinology

## 2021-12-14 ENCOUNTER — Other Ambulatory Visit: Payer: Self-pay

## 2021-12-14 VITALS — BP 152/96 | HR 76 | Ht 62.0 in | Wt 167.6 lb

## 2021-12-14 DIAGNOSIS — E559 Vitamin D deficiency, unspecified: Secondary | ICD-10-CM | POA: Insufficient documentation

## 2021-12-14 DIAGNOSIS — Z6830 Body mass index (BMI) 30.0-30.9, adult: Secondary | ICD-10-CM

## 2021-12-14 DIAGNOSIS — E6609 Other obesity due to excess calories: Secondary | ICD-10-CM | POA: Insufficient documentation

## 2021-12-14 DIAGNOSIS — E66811 Obesity, class 1: Secondary | ICD-10-CM | POA: Insufficient documentation

## 2021-12-14 DIAGNOSIS — I10 Essential (primary) hypertension: Secondary | ICD-10-CM | POA: Diagnosis not present

## 2021-12-14 LAB — POCT GLYCOSYLATED HEMOGLOBIN (HGB A1C): HbA1c, POC (controlled diabetic range): 5.6 % (ref 0.0–7.0)

## 2021-12-14 NOTE — Progress Notes (Signed)
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   SALIVARY GLAND SURGERY Left 1968   TONSILLECTOMY AND ADENOIDECTOMY  1951   Social History   Socioeconomic History   Marital status: Married    Spouse name: Not on file   Number of children: 1   Years of education: Not on file   Highest education level: Not on file  Occupational History   Occupation: retired Runner, broadcasting/film/video  Tobacco Use   Smoking status: Never   Smokeless tobacco: Never  Vaping Use   Vaping Use: Never used  Substance and Sexual Activity   Alcohol use: Not Currently   Drug use: Not Currently   Sexual activity: Not on file  Other Topics Concern   Not on file  Social History Narrative   Right handed    Lives in a two story home    Social Determinants of Health   Financial Resource Strain: Not on file  Food Insecurity: Not on file  Transportation Needs: Not on file  Physical Activity: Not on file  Stress: Not on file  Social Connections: Not on file   Family History  Problem Relation Age of Onset   Hypertension Mother    Heart attack Mother    Stroke Father    Leukemia Father    Allergic rhinitis Neg Hx    Angioedema Neg Hx    Asthma Neg Hx    Atopy Neg Hx    Eczema Neg Hx    Immunodeficiency Neg Hx    Urticaria Neg Hx    Outpatient Encounter Medications as of 12/14/2021  Medication Sig    hydrochlorothiazide (HYDRODIURIL) 25 MG tablet TAKE ONE TABLET BY MOUTH DAILY   L-THEANINE PO Take 150 mg by mouth as needed.   acetaminophen (TYLENOL) 325 MG tablet Take 325 mg by mouth every 6 (six) hours as needed for moderate pain.   Cholecalciferol (VITAMIN D3) 50 MCG (2000 UT) TABS Take 2,000 Units by mouth daily. Take 2 tablets daily   diphenhydrAMINE HCl (BENADRYL ALLERGY PO) Take 0.5-1 tablets by mouth daily as needed (allergy).   famotidine (PEPCID) 20 MG tablet Take 1 tablet (20 mg total) by mouth 2 (two) times daily.   Garlic (GARLIQUE PO) Take 1 tablet by mouth daily.   Lactase (LACTAID PO) Take 1 tablet by mouth daily as needed (lactose intolerance).   Lactobacillus (PROBIOTIC ACIDOPHILUS PO) Take 1 tablet by mouth daily.   Melatonin 500 MCG TBDP Take 1 tablet by mouth daily as needed (sleep).   Menatetrenone (VITAMIN  K2) 100 MCG TABS Take 100 mcg by mouth daily.   OVER THE COUNTER MEDICATION Take 1,500 mg by mouth daily. Beet root   pantoprazole (PROTONIX) 40 MG tablet Take 1 tablet (40 mg total) by mouth daily.   Propylene Glycol (SYSTANE BALANCE OP) Place 1 drop into both eyes 3 (three) times daily as needed (dry eyes).   Simethicone (GAS-X PO) Take 1 tablet by mouth daily as needed (flatulance).   [DISCONTINUED] Hyoscyamine Sulfate SL (LEVSIN/SL) 0.125 MG SUBL Use daily as needed (Patient not taking: Reported on 10/20/2021)   [DISCONTINUED] lansoprazole (PREVACID SOLUTAB) 30 MG disintegrating tablet Take 1 tablet (30 mg total) by mouth daily at 12 noon.   No facility-administered encounter medications on file as of 12/14/2021.   ALLERGIES: Allergies  Allergen Reactions   African Mango [Irvingia Gabonensis]    Allegra [Fexofenadine] Other (See Comments)    Abdominal pain   Amlodipine     lightheadedness   Atorvastatin Other (See Comments)   Cetirizine Hcl Other (See Comments)   Chlorthalidone     lightheadedness   Lactose Intolerance (Gi) Other (See Comments)    Upset  stomach   Lisinopril-Hydrochlorothiazide     Light headed   Loratadine Other (See Comments)   Penicillins Other (See Comments)   Pravastatin Other (See Comments)   Simvastatin Other (See Comments)   Sulfa Antibiotics Other (See Comments)   Valsartan Other (See Comments)    Myalgias, cough, tongue swelling   Spironolactone Rash    VACCINATION STATUS:  There is no immunization history on file for this patient.  HPI Regina Marsh is 76 y.o. female who returns for follow-up after she was seen in consultation for  work-up to rule out Cushing syndrome/disease due to recent occurrence of facial flushing.  Her work-up did not show any obvious endocrine problems. -His consult is requested by Shon Hale, MD.   See notes from prior visits.  She underwent cervical spinal surgery since last visit.  Has no new complaints today. She denies any prior history of endocrine dysfunction.  She does have hypertension  since June 2021.  She mentions the fact that it is difficult to control however she is taking only HCTZ 12.5 mg daily.  She does measure it at home regularly, the lowest measurement has been 148 over 80s.  She does have several clinic visits where a significant above target. -At one of her lab works she did have normal plasma metanephrines. -She denies any prior history of thyrotoxicosis, no exposure to heavy dose steroids on regular basis.  She denies any family history of endocrinopathies.  She has medical diagnosis of Sjogren's syndrome as well as lactose intolerance.  She observed that her allergy used to be only for milk and ice cream, however recently anything with dairy.  She is scheduled to see an allergist in the near future.  She showed a picture of her face at various times with flushing.  She also reports loose stools to diarrhea on and off.  She also wheezes at times.  She reports weight gain, unquantified amount.  Review of her records show that her weight has been stable  since June 2021. -Review of her medical records did not include any abdominal imaging. -She denies any diabetes nor antidiabetic medications.  She did not tolerate spironolactone.  Only medication she is taking currently are melatonin, Propine glycol, Benadryl as needed, Tylenol as needed.  Her complete 24-hour urine studies rule out Cushing syndrome, carcinoid syndrome, pheochromocytoma.  Her blood  work also showed some hyperthyroidism. She still has uncontrolled hypertension, would like to address it with her cardiologist next week.  Review of Systems  Constitutional: + Minimally fluctuating body weight, , no fatigue, no subjective hyperthermia, no subjective hypothermia   Objective:    Vitals with BMI 12/14/2021 10/20/2021 10/13/2021  Height 5\' 2"  5\' 2"  5\' 2"   Weight 167 lbs 10 oz 169 lbs 8 oz 169 lbs  BMI 30.65 30.99 30.9  Systolic 152 194  Diastolic 96 98 110  Pulse 76 68 82    BP (!) 152/96    Pulse 76    Ht 5\' 2"  (1.575 m)    Wt 167 lb 9.6 oz (76 kg)    BMI 30.65 kg/m   Wt Readings from Last 3 Encounters:  12/14/21 167 lb 9.6 oz (76 kg)  10/20/21 169 lb 8 oz (76.9 kg)  10/13/21 169 lb (76.7 kg)    Physical Exam  Constitutional:  Body mass index is 30.65 kg/m.,  + Moderately anxious state of mind,. Healing surgical wound on anterior lower neck from her recent spinal surgery. CMP ( most recent) CMP     Component Value Date/Time   NA 141 08/16/2021 0853   NA 144 12/14/2020 1039   K 3.1 (L) 08/16/2021 0853   CL 101 08/16/2021 0853   CO2 29 08/16/2021 0853   GLUCOSE 122 (H) 08/16/2021 0853   BUN 15 08/16/2021 0853   BUN 12 12/14/2020 1039   CREATININE 0.98 08/16/2021 0853   CALCIUM 9.7 08/16/2021 0853   PROT 7.5 08/16/2021 0853   PROT 7.4 12/14/2020 1039   ALBUMIN 4.4 08/16/2021 0853   ALBUMIN 4.6 12/14/2020 1039   AST 16 08/16/2021 0853   ALT 16 08/16/2021 0853   ALKPHOS 55 08/16/2021 0853   BILITOT 1.2 08/16/2021 0853   BILITOT 0.8 12/14/2020 1039    GFRNONAA 60 (L) 08/16/2021 0853   GFRAA 56 (L) 10/28/2020 1610   Recent Results (from the past 2160 hour(s))  HgB A1c     Status: None   Collection Time: 12/14/21  9:45 AM  Result Value Ref Range   Hemoglobin A1C     HbA1c POC (<> result, manual entry)     HbA1c, POC (prediabetic range)     HbA1c, POC (controlled diabetic range) 5.6 0.0 - 7.0 %      Assessment & Plan:  1.  Hypertension-uncontrolled 2.  Vitamin D deficiency -I reviewed her interval lab work which did not show any endocrine -abnormality.  Her point-of-care A1c is 5.6% indicating absence of diabetes/prediabetes. She still has uncontrolled hypertension, on hydrochlorothiazide 25 mg p.o. daily.  She would like to address this with her cardiologist next week. -Since she mentioned that during one of her lab work she was told that her thyroid was slightly abnormal.  I do not see any supporting lab work in the EMR.  I will send her for a more complete thyroid function test along with fasting lipid panel and CMP today.  Given the fact that she has class I obesity, hypertension, general dislike for medications, this patient will generally benefit from lifestyle medicine. - she acknowledges that there is a room for improvement in her food and drink choices. - Suggestion is made for her to avoid simple carbohydrates  from her diet including Cakes, Sweet Desserts, Ice Cream, Soda (diet and regular), Sweet Tea, Candies, Chips, Cookies, Store Bought Juices, Alcohol , Artificial Sweeteners,  Coffee Creamer, and "Sugar-free" Products, Lemonade. This will help patient  to have more stable blood glucose profile and potentially avoid unintended weight gain.  The following Lifestyle Medicine recommendations according to American College of Lifestyle Medicine  [facility]( ACLM) were discussed and and offered to patient and she  agrees to start the journey:  A. Whole Foods, Plant-Based Nutrition comprising of fruits and vegetables, plant-based proteins,  whole-grain carbohydrates was discussed in detail with the patient.   A list for source of those nutrients were also provided to the patient.  Patient will use only water or unsweetened tea for hydration. B.  The need to stay away from risky substances including alcohol, smoking; obtaining 7 to 9 hours of restorative sleep, at least 150 minutes of moderate intensity exercise weekly, the importance of healthy social connections,  and stress management techniques were discussed. C.  A full color page of  Calorie density of various food groups per pound showing examples of each food groups was provided to the patient.  She is advised to stay on vitamin D 3 2000 units daily. - I did not initiate any new prescriptions today. - she is advised to maintain close follow up with Shon Haleimberlake, Kathryn S, MD for primary care needs.  She is advised to maintain close follow-up with her cardiologist, PCP, as well as keep her appointment with her allergist.     I spent 31 minutes in the care of the patient today including review of labs from Thyroid Function, CMP, and other relevant labs ; imaging/biopsy records (current and previous including abstractions from other facilities); face-to-face time discussing  her lab results and symptoms, medications doses, her options of short and long term treatment based on the latest standards of care / guidelines;   and documenting the encounter.  Hayden RasmussenMarlys Kren  participated in the discussions, expressed understanding, and voiced agreement with the above plans.  All questions were answered to her satisfaction. she is encouraged to contact clinic should she have any questions or concerns prior to her return visit.    Follow up plan: Return in about 1 year (around 12/15/2022), or labs any day, will call her with results., for F/U with no Labs.   Marquis LunchGebre Jahsiah Carpenter, MD [facility] [address] [place], [place] [address] Phone:  [phone]  Fax: [phone]     12/14/2021, 12:14 PM  This note was partially dictated with voice recognition software. Similar sounding words can be transcribed inadequately or may not  be corrected upon review.

## 2021-12-16 DIAGNOSIS — I10 Essential (primary) hypertension: Secondary | ICD-10-CM | POA: Diagnosis not present

## 2021-12-17 LAB — LIPID PANEL
Chol/HDL Ratio: 5 ratio — ABNORMAL HIGH (ref 0.0–4.4)
Cholesterol, Total: 283 mg/dL — ABNORMAL HIGH (ref 100–199)
HDL: 57 mg/dL (ref >39)
LDL Chol Calc (NIH): 186 mg/dL — ABNORMAL HIGH (ref 0–99)
Triglycerides: 211 mg/dL — ABNORMAL HIGH (ref 0–149)
VLDL Cholesterol Cal: 40 mg/dL (ref 5–40)

## 2021-12-17 LAB — COMPREHENSIVE METABOLIC PANEL
ALT: 20 IU/L (ref 0–32)
AST: 21 IU/L (ref 0–40)
Albumin/Globulin Ratio: 1.8 (ref 1.2–2.2)
Albumin: 4.6 g/dL (ref 3.7–4.7)
Alkaline Phosphatase: 72 IU/L (ref 44–121)
BUN/Creatinine Ratio: 19 (ref 12–28)
BUN: 17 mg/dL (ref 8–27)
Bilirubin Total: 1.3 mg/dL — ABNORMAL HIGH (ref 0.0–1.2)
CO2: 25 mmol/L (ref 20–29)
Calcium: 9.3 mg/dL (ref 8.7–10.3)
Chloride: 100 mmol/L (ref 96–106)
Creatinine, Ser: 0.91 mg/dL (ref 0.57–1.00)
Globulin, Total: 2.6 g/dL (ref 1.5–4.5)
Glucose: 101 mg/dL — ABNORMAL HIGH (ref 70–99)
Potassium: 3.4 mmol/L — ABNORMAL LOW (ref 3.5–5.2)
Sodium: 140 mmol/L (ref 134–144)
Total Protein: 7.2 g/dL (ref 6.0–8.5)
eGFR: 65 mL/min/{1.73_m2} (ref >59)

## 2021-12-17 LAB — TSH: TSH: 2.73 u[IU]/mL (ref 0.450–4.500)

## 2021-12-17 LAB — T4, FREE: Free T4: 1.05 ng/dL (ref 0.82–1.77)

## 2021-12-17 LAB — T3, FREE: T3, Free: 2.7 pg/mL (ref 2.0–4.4)

## 2021-12-20 ENCOUNTER — Other Ambulatory Visit: Payer: Self-pay | Admitting: "Endocrinology

## 2021-12-20 DIAGNOSIS — E782 Mixed hyperlipidemia: Secondary | ICD-10-CM

## 2022-01-07 ENCOUNTER — Encounter (HOSPITAL_BASED_OUTPATIENT_CLINIC_OR_DEPARTMENT_OTHER): Payer: Self-pay | Admitting: Cardiology

## 2022-01-14 ENCOUNTER — Ambulatory Visit (HOSPITAL_BASED_OUTPATIENT_CLINIC_OR_DEPARTMENT_OTHER): Payer: Medicare Other | Admitting: Cardiology

## 2022-01-21 DIAGNOSIS — Z0000 Encounter for general adult medical examination without abnormal findings: Secondary | ICD-10-CM | POA: Diagnosis not present

## 2022-01-21 DIAGNOSIS — I10 Essential (primary) hypertension: Secondary | ICD-10-CM | POA: Diagnosis not present

## 2022-01-21 DIAGNOSIS — I160 Hypertensive urgency: Secondary | ICD-10-CM | POA: Diagnosis not present

## 2022-01-21 DIAGNOSIS — M3500 Sicca syndrome, unspecified: Secondary | ICD-10-CM | POA: Diagnosis not present

## 2022-01-21 DIAGNOSIS — R5383 Other fatigue: Secondary | ICD-10-CM | POA: Diagnosis not present

## 2022-01-21 DIAGNOSIS — M8589 Other specified disorders of bone density and structure, multiple sites: Secondary | ICD-10-CM | POA: Diagnosis not present

## 2022-01-21 DIAGNOSIS — E7800 Pure hypercholesterolemia, unspecified: Secondary | ICD-10-CM | POA: Diagnosis not present

## 2022-01-27 ENCOUNTER — Other Ambulatory Visit (HOSPITAL_COMMUNITY): Payer: Self-pay | Admitting: Family Medicine

## 2022-01-27 DIAGNOSIS — I160 Hypertensive urgency: Secondary | ICD-10-CM

## 2022-02-03 DIAGNOSIS — M25511 Pain in right shoulder: Secondary | ICD-10-CM | POA: Diagnosis not present

## 2022-02-04 ENCOUNTER — Telehealth: Payer: Self-pay | Admitting: Cardiology

## 2022-02-04 ENCOUNTER — Other Ambulatory Visit: Payer: Self-pay

## 2022-02-04 ENCOUNTER — Encounter (HOSPITAL_BASED_OUTPATIENT_CLINIC_OR_DEPARTMENT_OTHER): Payer: Self-pay | Admitting: Orthopaedic Surgery

## 2022-02-04 ENCOUNTER — Encounter (HOSPITAL_BASED_OUTPATIENT_CLINIC_OR_DEPARTMENT_OTHER)
Admission: RE | Admit: 2022-02-04 | Discharge: 2022-02-04 | Disposition: A | Discharge status: Home / Self Care | Payer: Medicare Other | Source: Ambulatory Visit | Attending: Orthopaedic Surgery | Admitting: Orthopaedic Surgery

## 2022-02-04 ENCOUNTER — Ambulatory Visit (HOSPITAL_BASED_OUTPATIENT_CLINIC_OR_DEPARTMENT_OTHER): Payer: Medicare Other

## 2022-02-04 DIAGNOSIS — Z01810 Encounter for preprocedural cardiovascular examination: Secondary | ICD-10-CM | POA: Insufficient documentation

## 2022-02-04 DIAGNOSIS — I160 Hypertensive urgency: Secondary | ICD-10-CM | POA: Insufficient documentation

## 2022-02-04 DIAGNOSIS — M25531 Pain in right wrist: Secondary | ICD-10-CM | POA: Diagnosis not present

## 2022-02-04 DIAGNOSIS — M25511 Pain in right shoulder: Secondary | ICD-10-CM | POA: Diagnosis not present

## 2022-02-04 LAB — SURGICAL PCR SCREEN
MRSA, PCR: NEGATIVE
Staphylococcus aureus: NEGATIVE

## 2022-02-04 LAB — BASIC METABOLIC PANEL
Anion gap: 11 (ref 5–15)
BUN: 14 mg/dL (ref 8–23)
CO2: 27 mmol/L (ref 22–32)
Calcium: 9.8 mg/dL (ref 8.9–10.3)
Chloride: 102 mmol/L (ref 98–111)
Creatinine, Ser: 0.95 mg/dL (ref 0.44–1.00)
GFR, Estimated: >60 mL/min (ref >60)
Glucose, Bld: 115 mg/dL — ABNORMAL HIGH (ref 70–99)
Potassium: 3.3 mmol/L — ABNORMAL LOW (ref 3.5–5.1)
Sodium: 140 mmol/L (ref 135–145)

## 2022-02-04 LAB — ECHOCARDIOGRAM COMPLETE
Area-P 1/2: 3.26 cm2
Height: 62 in
S' Lateral: 3.1 cm
Weight: 2576 oz

## 2022-02-04 NOTE — Progress Notes (Signed)
Patient's chart and hx labile BP reviewed with Dr Smith Robert. Her BP at PAT appointment was 214/84, she had not taken her HCTZ that morning. She is having an ECHO also today for labile BP. She is allergic to multiple BP meds and states she has tried many. Dr Smith Robert states that we will evaluate her DOS and check her BP. ?

## 2022-02-04 NOTE — Telephone Encounter (Addendum)
? ?  Name: Regina Marsh  ?DOB: 1946/07/17  ?MRN: [id number]  ? ?Primary Cardiologist: Buford Dresser, MD ? ?Chart reviewed as part of pre-operative protocol coverage. Patient was contacted 02/04/2022 in reference to pre-operative risk assessment for pending surgery as outlined below.  Vernella Student was last seen on 10/20/2021 by Dr. Harrell Gave.  Since that day, Shoshanna Corban has done well from a cardiac perspective, and been without symptoms of angina or decompensation.  She recently suffered a mechanical fall, tripping over furniture in the dark house leading to a shoulder injury with recommendation for surgical repair.  At baseline, she remains active, ambulating 6,000-10,000 steps per day without cardiac limitation.  She remains limited in her functional status regarding her prior neck surgery.  She is also under increased stress with her husband's health. RCRI: low risk for noncardiac surgery.  She is able to achieve > 4 METs without cardiac limitation. From a cardiac perspective, she may hold aspirin for 5 days prior to surgery (no prior cardiac stents). ? ?Therefore, based on ACC/AHA guidelines, the patient would be at acceptable risk for the planned procedure without further cardiovascular testing, as long as her blood pressure is under good control on the day of surgery. ? ?The patient was advised that if she develops new symptoms prior to surgery to contact our office to arrange for a follow-up visit, and she verbalized understanding. ? ?I will route this recommendation to the requesting party via Epic fax function and remove from pre-op pool. Please call with questions. ? ?Christell Faith, PA-C ?02/04/2022, 4:37 PM ? ?

## 2022-02-04 NOTE — Telephone Encounter (Signed)
? ?  Pre-operative Risk Assessment  ?  ?Patient Name: Regina Marsh  ?DOB: 28-Jan-1946 ?MRN: [id number]  ? ?  ? ?Request for Surgical Clearance   ? ?Procedure:   right total shoulder replacement  ? ?Date of Surgery:  02/09/2022                        ?   ?Surgeon:  Ramond Marrow MD ?Surgeon's Group or Practice Name:  [facility] ?Phone number:  [phone] ?Fax number:  [phone] Attn: Alfonso Ramus (scheduler) ?  ?Type of Clearance Requested:   ?- Medical  ?- Pharmacy:  Hold Aspirin   ?  ?Type of Anesthesia:  General  ?  ?Additional requests/questions:     ? ?Signed, ?Julaine Fusi M   ?02/04/2022, 4:13 PM  ? ?

## 2022-02-07 NOTE — H&P (Signed)
? ? ?PREOPERATIVE H&P ? ?Chief Complaint: massive right rotator cuff tear, djd ? ?HPI: ?Regina Marsh is a 76 y.o. female who is scheduled for Procedure(s): ?REVERSE SHOULDER ARTHROPLASTY.  ? ?Patient has a past medical history significant for GERD, HTN, HLD, Sjogren's disease.  ? ?The patient is a 76 year old who has a history of high blood pressure who saw Dr. Sheppard Coil yesterday after a fall which happened earlier in the day. She had an acute traumatic injury to the shoulder. He did a bedside ultrasound that demonstrated a full thickness subscapularis and supraspinatus tear. She has been unable to raise her arm afterwards. Her recovery is complicated by the fact her husband needs to undergo surgery for his prostate in an urgent  setting. She needs to be able to take care of him. She is right hand dominant at baseline.  She is unable to raise the arm.  ? ?Symptoms are rated as moderate to severe, and have been worsening.  This is significantly impairing activities of daily living.   ? ?Please see clinic note for further details on this patient's care.   ? ?She has elected for surgical management.  ? ?Past Medical History:  ?Diagnosis Date  ? GERD (gastroesophageal reflux disease)   ? Headache   ? Hyperlipidemia   ? Hypertension   ? Low back pain   ? Sjogren's disease ([place])   ? ?Past Surgical History:  ?Procedure Laterality Date  ? BREAST LUMPECTOMY Bilateral   ? left-1966, right-2001  ? CERVICAL SPINE SURGERY  11/03/2021  ? PLATE AND SCREWS, Dr Arnoldo Morale  ? [place]  ? detached eye lid Left 2011  ? LAPAROSCOPY    ? x 2, for adhesions, 1989 and 1990  ? NASAL SINUS SURGERY  2006  ? 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 SALIVARY GLAND SURGERY Left 1968  ? TONSILLECTOMY AND ADENOIDECTOMY  1951  ? ?Social History  ? ?Socioeconomic History  ? Marital status: Married  ?  Spouse name: Not on file  ? Number of children: 1  ? Years of education: Not on file  ? Highest education level: Not on file  ?Occupational  History  ? Occupation: retired Pharmacist, hospital  ?Tobacco Use  ? Smoking status: Never  ? Smokeless tobacco: Never  ?Vaping Use  ? Vaping Use: Never used  ?Substance and Sexual Activity  ? Alcohol use: Yes  ?  Comment: rare  ? Drug use: Never  ? Sexual activity: Not Currently  ?  Birth control/protection: Post-menopausal  ?Other Topics Concern  ? Not on file  ?Social History Narrative  ? Right handed   ? Lives in a two story home   ? ?Social Determinants of Health  ? ?Financial Resource Strain: Not on file  ?Food Insecurity: Not on file  ?Transportation Needs: Not on file  ?Physical Activity: Not on file  ?Stress: Not on file  ?Social Connections: Not on file  ? ?Family History  ?Problem Relation Age of Onset  ? Hypertension Mother   ? Heart attack Mother   ? Stroke Father   ? Leukemia Father   ? Allergic rhinitis Neg Hx   ? Angioedema Neg Hx   ? Asthma Neg Hx   ? Atopy Neg Hx   ? Eczema Neg Hx   ? Immunodeficiency Neg Hx   ? Urticaria Neg Hx   ? ?Allergies  ?Allergen Reactions  ? African Mango [Irvingia Gabonensis]   ? Allegra [Fexofenadine] Other (See Comments)  ?  Abdominal  pain  ? Amlodipine   ?  lightheadedness  ? Cetirizine Hcl Other (See Comments)  ? Chlorthalidone   ?  lightheadedness  ? Lactose Intolerance (Gi) Other (See Comments)  ?  Upset stomach  ? Lisinopril   ?  CAUSED EYES TO SWELL  ? Loratadine Other (See Comments)  ? Pravastatin Other (See Comments)  ? Simvastatin Other (See Comments)  ? Sulfa Antibiotics Other (See Comments)  ? Valsartan Other (See Comments)  ?  Myalgias, cough, tongue swelling  ? Penicillins Rash and Other (See Comments)  ? Spironolactone Rash  ? ?Prior to Admission medications   ?Medication Sig Start Date End Date Taking? Authorizing Provider  ?acetaminophen (TYLENOL) 325 MG tablet Take 325 mg by mouth every 6 (six) hours as needed for moderate pain.   Yes [provider]  ?aspirin EC 81 MG tablet Take 81 mg by mouth daily. Swallow whole.   Yes [provider]   ?Cholecalciferol (VITAMIN D3) 50 MCG (2000 UT) TABS Take 2,000 Units by mouth daily. Take 2 tablets daily   Yes [provider]  ?diphenhydrAMINE HCl (BENADRYL ALLERGY PO) Take 0.5-1 tablets by mouth daily as needed (allergy).   Yes [provider]  ?famotidine (PEPCID) 20 MG tablet Take 1 tablet (20 mg total) by mouth 2 (two) times daily. 01/07/21  Yes Garnet Sierras, DO  ?Garlic (GARLIQUE PO) Take 1 tablet by mouth daily.   Yes [provider]  ?hydrochlorothiazide (HYDRODIURIL) 25 MG tablet TAKE ONE TABLET BY MOUTH DAILY 11/15/21  Yes Buford Dresser, MD  ?L-THEANINE PO Take 150 mg by mouth as needed.   Yes [provider]  ?Lactase (LACTAID PO) Take 1 tablet by mouth daily as needed (lactose intolerance).   Yes [provider]  ?Lactobacillus (PROBIOTIC ACIDOPHILUS PO) Take 1 tablet by mouth daily.   Yes [provider]  ?Melatonin 500 MCG TBDP Take 1 tablet by mouth daily as needed (sleep).   Yes [provider]  ?Menatetrenone (VITAMIN K2) 100 MCG TABS Take 100 mcg by mouth daily.   Yes [provider]  ?OVER THE COUNTER MEDICATION Take 1,500 mg by mouth daily. Beet root   Yes [provider]  ?pantoprazole (PROTONIX) 40 MG tablet Take 1 tablet (40 mg total) by mouth daily. 08/17/21  Yes Nandigam, Venia Minks, MD  ?Propylene Glycol (SYSTANE BALANCE OP) Place 1 drop into both eyes 3 (three) times daily as needed (dry eyes).   Yes [provider]  ?Simethicone (GAS-X PO) Take 1 tablet by mouth daily as needed (flatulance).   Yes [provider]  ? ? ?ROS: All other systems have been reviewed and were otherwise negative with the exception of those mentioned in the HPI and as above. ? ?Physical Exam: ?General: Alert, no acute distress ?Cardiovascular: No pedal edema ?Respiratory: No cyanosis, no use of accessory musculature ?GI: No organomegaly, abdomen is soft and non-tender ?Skin: No lesions in the area of chief  complaint ?Neurologic: Sensation intact distally ?Psychiatric: Patient is competent for consent with normal mood and affect ?Lymphatic: No axillary or cervical lymphadenopathy ? ?MUSCULOSKELETAL:  ?On examination active forward elevation to 30, external rotation to 60, passive forward elevation to 140-150 degrees. Axillary nerve appears to be working normally. ? ?Imaging: ?Three views of the shoulder are reviewed on [place] and demonstrate no acute osseous abnormality on X-ray.  ? ?BMI: ?Estimated body mass index is 29.45 kg/m? as calculated from the following: ?  Height as of this encounter: 5\' 2"  (  1.575 m). ?  Weight as of this encounter: 73 kg. ? ?Lab Results  ?Component Value Date  ? ALBUMIN 4.6 12/16/2021  ? ?Diabetes: ?Patient does not have a diagnosis of diabetes. ?Lab Results  ?Component Value Date  ? HGBA1C 5.6 12/14/2021  ? ?  ?Smoking Status: ?  reports that she has never smoked. She has never used smokeless tobacco. ? ? ? ? ?Assessment: ?massive right rotator cuff tear, djd ? ?Plan: ?Plan for Procedure(s): ?REVERSE SHOULDER ARTHROPLASTY ? ?The risks benefits and alternatives were discussed with the patient including but not limited to the risks of nonoperative treatment, versus surgical intervention including infection, bleeding, nerve injury,  blood clots, cardiopulmonary complications, morbidity, mortality, among others, and they were willing to proceed.  ? ?We additionally specifically discussed risks of axillary nerve injury, infection, periprosthetic fracture, continued pain and longevity of implants prior to beginning procedure.  ?  ?Patient will be closely monitored in PACU for medical stabilization and pain control. If found stable in PACU, patient may be discharged home with outpatient follow-up. If any concerns regarding patient's stabilization patient will be admitted for observation after surgery. The patient is planning to be discharged home with outpatient PT.  ? ?The patient acknowledged  the explanation, agreed to proceed with the plan and consent was signed.  ? ?Operative Plan: Right reverse total shoulder arthroplasty  ?Discharge Medications: Standard ?DVT Prophylaxis: Aspirin ?Physical Therapy: Outpa

## 2022-02-08 ENCOUNTER — Encounter (HOSPITAL_COMMUNITY): Payer: Self-pay | Admitting: Orthopaedic Surgery

## 2022-02-08 ENCOUNTER — Other Ambulatory Visit: Payer: Self-pay

## 2022-02-08 NOTE — Progress Notes (Signed)
For Short Stay: ?COVID SWAB appointment date: N/A ?Date of COVID positive in last 90 days: N/A ? ?Bowel Prep reminder: N/A ? ? ?For Anesthesia: ?PCP - Shon Hale, MD ?Cardiologist - Jodelle Red, MD cardiac clearance note 02/04/22 in epic by Sondra Barges, PA-C ? ?Chest x-ray - N/A ?EKG - 02/04/22 in epic ?Stress Test - N/A ?ECHO - 02/04/22 in epic ?Cardiac Cath - N/A ?Pacemaker/ICD device last checked:N/A ?Pacemaker orders received:N/A ?Device Rep notified:N/A ? ?Spinal Cord Stimulator:N/A ? ?Sleep Study - N/A ?CPAP -  ? ?Fasting Blood Sugar - N/A ?Checks Blood Sugar __N/A___ times a day ?Date and result of last Hgb A1c-N/A ? ?Blood Thinner Instructions:N/A ?Aspirin Instructions: ?Last Dose: ? ?Activity level: Can go up a flight of stairs and activities of daily living without stopping and without chest pain and/or shortness of breath ?   ?Anesthesia review: N/A ? ?Patient denies shortness of breath, fever, cough and chest pain at PAT appointment ? ? ?Patient verbalized understanding of instructions that were given to them at the PAT appointment. Patient was also instructed that they will need to review over the PAT instructions again at home before surgery.  ?

## 2022-02-09 ENCOUNTER — Ambulatory Visit (HOSPITAL_COMMUNITY): Payer: Medicare Other

## 2022-02-09 ENCOUNTER — Ambulatory Visit (HOSPITAL_BASED_OUTPATIENT_CLINIC_OR_DEPARTMENT_OTHER): Payer: Medicare Other | Admitting: Anesthesiology

## 2022-02-09 ENCOUNTER — Ambulatory Visit (HOSPITAL_COMMUNITY)
Admission: RE | Admit: 2022-02-09 | Discharge: 2022-02-09 | Disposition: A | Discharge status: Home / Self Care | Payer: Medicare Other | Attending: Orthopaedic Surgery | Admitting: Orthopaedic Surgery

## 2022-02-09 ENCOUNTER — Encounter (HOSPITAL_COMMUNITY): Payer: Self-pay | Admitting: Orthopaedic Surgery

## 2022-02-09 ENCOUNTER — Ambulatory Visit (HOSPITAL_COMMUNITY): Payer: Medicare Other | Admitting: Anesthesiology

## 2022-02-09 ENCOUNTER — Other Ambulatory Visit: Payer: Self-pay

## 2022-02-09 ENCOUNTER — Ambulatory Visit (HOSPITAL_BASED_OUTPATIENT_CLINIC_OR_DEPARTMENT_OTHER): Payer: Medicare Other | Admitting: Cardiology

## 2022-02-09 ENCOUNTER — Encounter (HOSPITAL_COMMUNITY)
Admission: RE | Disposition: A | Discharge status: Home / Self Care | Payer: Self-pay | Source: Home / Self Care | Attending: Orthopaedic Surgery

## 2022-02-09 DIAGNOSIS — Z471 Aftercare following joint replacement surgery: Secondary | ICD-10-CM | POA: Diagnosis not present

## 2022-02-09 DIAGNOSIS — M19011 Primary osteoarthritis, right shoulder: Secondary | ICD-10-CM | POA: Diagnosis not present

## 2022-02-09 DIAGNOSIS — I10 Essential (primary) hypertension: Secondary | ICD-10-CM | POA: Diagnosis not present

## 2022-02-09 DIAGNOSIS — Z9889 Other specified postprocedural states: Secondary | ICD-10-CM | POA: Diagnosis not present

## 2022-02-09 DIAGNOSIS — Z96611 Presence of right artificial shoulder joint: Secondary | ICD-10-CM | POA: Diagnosis not present

## 2022-02-09 DIAGNOSIS — Z79899 Other long term (current) drug therapy: Secondary | ICD-10-CM | POA: Diagnosis not present

## 2022-02-09 DIAGNOSIS — M3500 Sicca syndrome, unspecified: Secondary | ICD-10-CM | POA: Insufficient documentation

## 2022-02-09 DIAGNOSIS — E785 Hyperlipidemia, unspecified: Secondary | ICD-10-CM | POA: Diagnosis not present

## 2022-02-09 DIAGNOSIS — M75101 Unspecified rotator cuff tear or rupture of right shoulder, not specified as traumatic: Secondary | ICD-10-CM | POA: Diagnosis not present

## 2022-02-09 DIAGNOSIS — M75121 Complete rotator cuff tear or rupture of right shoulder, not specified as traumatic: Secondary | ICD-10-CM

## 2022-02-09 DIAGNOSIS — Z01818 Encounter for other preprocedural examination: Secondary | ICD-10-CM

## 2022-02-09 DIAGNOSIS — K219 Gastro-esophageal reflux disease without esophagitis: Secondary | ICD-10-CM | POA: Insufficient documentation

## 2022-02-09 DIAGNOSIS — G8918 Other acute postprocedural pain: Secondary | ICD-10-CM | POA: Diagnosis not present

## 2022-02-09 HISTORY — DX: Hyperlipidemia, unspecified: E78.5

## 2022-02-09 HISTORY — DX: Headache, unspecified: R51.9

## 2022-02-09 HISTORY — PX: REVERSE SHOULDER ARTHROPLASTY: SHX5054

## 2022-02-09 IMAGING — DX DG SHOULDER 1V*R*
1 series · 1 of 1 positions shown · non-contrast
Comparison: [DATE]

CLINICAL DATA: Postop shoulder surgery

EXAM:
RIGHT SHOULDER - 1 VIEW

[shoulder obl]
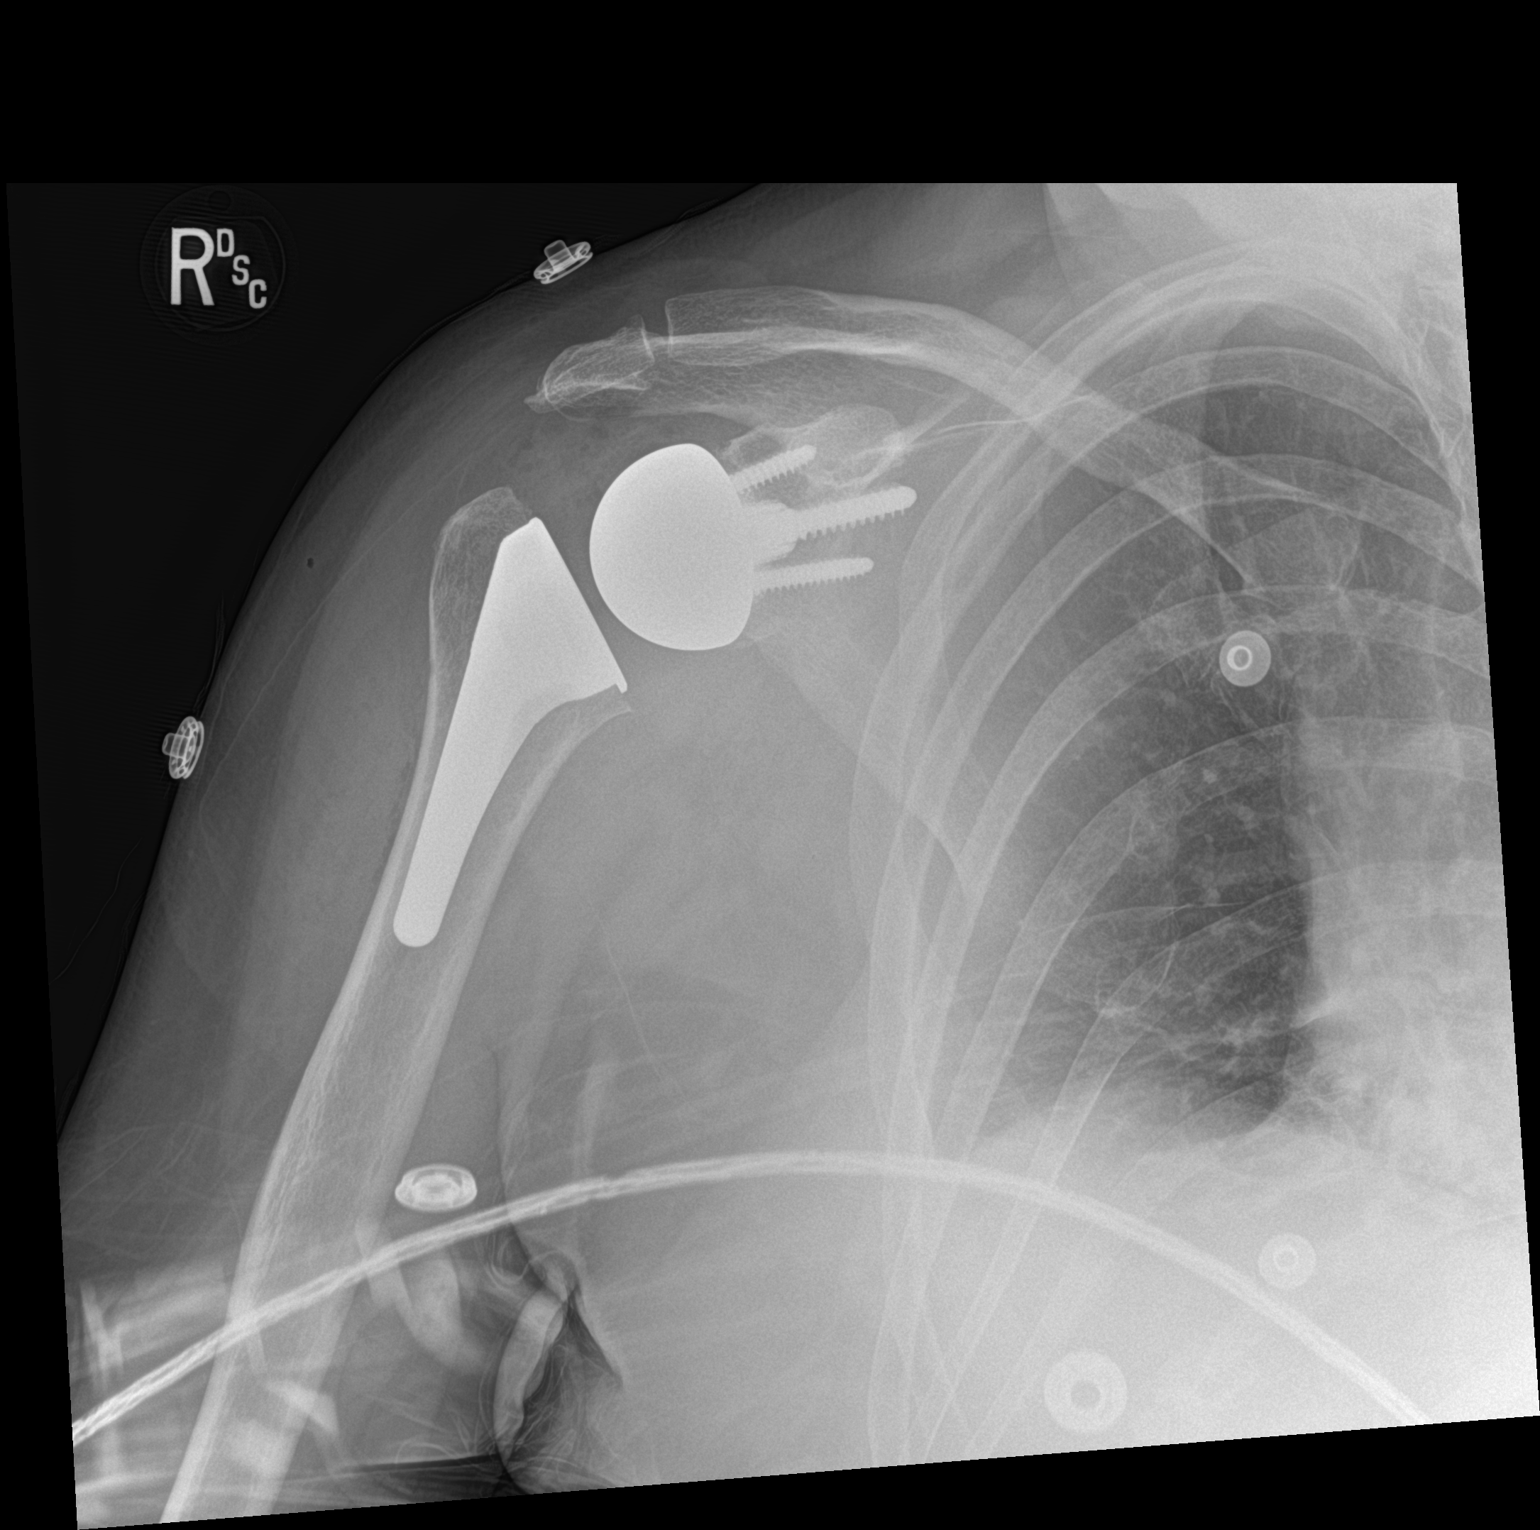

[1 of 1 positions shown; findings below may reference images not displayed]

FINDINGS: Reverse arthroplasty right shoulder. Satisfactory position head
alignment. No fracture or complication.
IMPRESSION: Satisfactory right shoulder replacement

## 2022-02-09 SURGERY — ARTHROPLASTY, SHOULDER, TOTAL, REVERSE
Anesthesia: Regional | Site: Shoulder | Laterality: Right

## 2022-02-09 MED ORDER — MIDAZOLAM HCL 2 MG/2ML IJ SOLN
1.0000 mg | INTRAMUSCULAR | Status: DC
  Administered 2022-02-09: 1 mg via INTRAVENOUS
  Filled 2022-02-09: qty 2

## 2022-02-09 MED ORDER — DEXAMETHASONE SODIUM PHOSPHATE 10 MG/ML IJ SOLN
INTRAMUSCULAR | Status: AC
  Filled 2022-02-09: qty 2

## 2022-02-09 MED ORDER — OXYCODONE HCL 5 MG PO TABS: 5.0000 mg | ORAL_TABLET | Freq: Once | ORAL | Status: DC | PRN

## 2022-02-09 MED ORDER — AMISULPRIDE (ANTIEMETIC) 5 MG/2ML IV SOLN
INTRAVENOUS | Status: AC
  Filled 2022-02-09: qty 4

## 2022-02-09 MED ORDER — BUPIVACAINE LIPOSOME 1.3 % IJ SUSP
INTRAMUSCULAR | Status: DC | PRN
  Administered 2022-02-09: 10 mL via PERINEURAL

## 2022-02-09 MED ORDER — CEFAZOLIN SODIUM-DEXTROSE 2-4 GM/100ML-% IV SOLN
2.0000 g | INTRAVENOUS | Status: AC
  Administered 2022-02-09: 2 g via INTRAVENOUS
  Filled 2022-02-09: qty 100

## 2022-02-09 MED ORDER — ONDANSETRON HCL 4 MG/2ML IJ SOLN: 4.0000 mg | Freq: Once | INTRAMUSCULAR | Status: DC | PRN

## 2022-02-09 MED ORDER — ONDANSETRON HCL 4 MG/2ML IJ SOLN
INTRAMUSCULAR | Status: DC | PRN
Start: 2022-02-09 — End: 2022-02-09
  Administered 2022-02-09: 4 mg via INTRAVENOUS

## 2022-02-09 MED ORDER — PHENYLEPHRINE HCL (PRESSORS) 10 MG/ML IV SOLN
INTRAVENOUS | Status: AC
  Filled 2022-02-09: qty 1

## 2022-02-09 MED ORDER — GLYCOPYRROLATE 0.2 MG/ML IJ SOLN
INTRAMUSCULAR | Status: AC
  Filled 2022-02-09: qty 1

## 2022-02-09 MED ORDER — PROPOFOL 10 MG/ML IV BOLUS
INTRAVENOUS | Status: AC
  Filled 2022-02-09: qty 20

## 2022-02-09 MED ORDER — ACETAMINOPHEN 500 MG PO TABS
1000.0000 mg | ORAL_TABLET | Freq: Once | ORAL | Status: AC
  Administered 2022-02-09: 1000 mg via ORAL
  Filled 2022-02-09: qty 2

## 2022-02-09 MED ORDER — PHENYLEPHRINE 80 MCG/ML (10ML) SYRINGE FOR IV PUSH (FOR BLOOD PRESSURE SUPPORT)
PREFILLED_SYRINGE | INTRAVENOUS | Status: DC | PRN
  Administered 2022-02-09: 160 ug via INTRAVENOUS
  Administered 2022-02-09: 80 ug via INTRAVENOUS

## 2022-02-09 MED ORDER — SODIUM CHLORIDE 0.9 % IR SOLN
Status: DC | PRN
  Administered 2022-02-09: 1000 mL

## 2022-02-09 MED ORDER — EPHEDRINE 5 MG/ML INJ
INTRAVENOUS | Status: AC
  Filled 2022-02-09: qty 5

## 2022-02-09 MED ORDER — FENTANYL CITRATE (PF) 250 MCG/5ML IJ SOLN
INTRAMUSCULAR | Status: DC | PRN
  Administered 2022-02-09 (×2): 50 ug via INTRAVENOUS

## 2022-02-09 MED ORDER — BUPIVACAINE HCL (PF) 0.5 % IJ SOLN
INTRAMUSCULAR | Status: DC | PRN
  Administered 2022-02-09: 15 mL via PERINEURAL

## 2022-02-09 MED ORDER — MELOXICAM 15 MG PO TABS: 15.0000 mg | ORAL_TABLET | Freq: Every day | ORAL | 0 refills | Status: DC

## 2022-02-09 MED ORDER — FENTANYL CITRATE PF 50 MCG/ML IJ SOSY
25.0000 ug | PREFILLED_SYRINGE | INTRAMUSCULAR | Status: DC | PRN
  Administered 2022-02-09: 50 ug via INTRAVENOUS

## 2022-02-09 MED ORDER — PROPOFOL 10 MG/ML IV BOLUS
INTRAVENOUS | Status: DC | PRN
  Administered 2022-02-09: 150 mg via INTRAVENOUS

## 2022-02-09 MED ORDER — 0.9 % SODIUM CHLORIDE (POUR BTL) OPTIME
TOPICAL | Status: DC | PRN
  Administered 2022-02-09: 1000 mL

## 2022-02-09 MED ORDER — ACETAMINOPHEN 500 MG PO TABS: 1000.0000 mg | ORAL_TABLET | Freq: Three times a day (TID) | ORAL | 0 refills | Status: AC

## 2022-02-09 MED ORDER — STERILE WATER FOR IRRIGATION IR SOLN
Status: DC | PRN
  Administered 2022-02-09: 2000 mL

## 2022-02-09 MED ORDER — SUGAMMADEX SODIUM 200 MG/2ML IV SOLN
INTRAVENOUS | Status: DC | PRN
Start: 2022-02-09 — End: 2022-02-09
  Administered 2022-02-09: 300 mg via INTRAVENOUS

## 2022-02-09 MED ORDER — VANCOMYCIN HCL 1000 MG IV SOLR
INTRAVENOUS | Status: AC
  Filled 2022-02-09: qty 20

## 2022-02-09 MED ORDER — FENTANYL CITRATE (PF) 250 MCG/5ML IJ SOLN
INTRAMUSCULAR | Status: AC
  Filled 2022-02-09: qty 5

## 2022-02-09 MED ORDER — LIDOCAINE 2% (20 MG/ML) 5 ML SYRINGE
INTRAMUSCULAR | Status: DC | PRN
Start: 2022-02-09 — End: 2022-02-09
  Administered 2022-02-09: 60 mg via INTRAVENOUS

## 2022-02-09 MED ORDER — ASPIRIN 81 MG PO CHEW: 81.0000 mg | CHEWABLE_TABLET | Freq: Two times a day (BID) | ORAL | 0 refills | Status: AC

## 2022-02-09 MED ORDER — ACETAMINOPHEN 500 MG PO TABS: 1000.0000 mg | ORAL_TABLET | Freq: Once | ORAL | Status: DC

## 2022-02-09 MED ORDER — HYDRALAZINE HCL 20 MG/ML IJ SOLN
10.0000 mg | Freq: Once | INTRAMUSCULAR | Status: AC
  Administered 2022-02-09: 10 mg via INTRAVENOUS
  Filled 2022-02-09: qty 1

## 2022-02-09 MED ORDER — ROCURONIUM BROMIDE 10 MG/ML (PF) SYRINGE
PREFILLED_SYRINGE | INTRAVENOUS | Status: DC | PRN
  Administered 2022-02-09: 60 mg via INTRAVENOUS

## 2022-02-09 MED ORDER — PROPOFOL 500 MG/50ML IV EMUL
INTRAVENOUS | Status: AC
  Filled 2022-02-09: qty 50

## 2022-02-09 MED ORDER — GABAPENTIN 300 MG PO CAPS
300.0000 mg | ORAL_CAPSULE | Freq: Once | ORAL | Status: AC
  Administered 2022-02-09: 300 mg via ORAL
  Filled 2022-02-09: qty 1

## 2022-02-09 MED ORDER — LACTATED RINGERS IV BOLUS
500.0000 mL | Freq: Once | INTRAVENOUS | Status: AC
  Administered 2022-02-09: 500 mL via INTRAVENOUS

## 2022-02-09 MED ORDER — MIDAZOLAM HCL 2 MG/2ML IJ SOLN
INTRAMUSCULAR | Status: AC
  Filled 2022-02-09: qty 2

## 2022-02-09 MED ORDER — PROPOFOL 1000 MG/100ML IV EMUL
INTRAVENOUS | Status: AC
  Filled 2022-02-09: qty 100

## 2022-02-09 MED ORDER — BUPIVACAINE HCL (PF) 0.5 % IJ SOLN
INTRAMUSCULAR | Status: AC
  Filled 2022-02-09: qty 30

## 2022-02-09 MED ORDER — LACTATED RINGERS IV SOLN: INTRAVENOUS | Status: DC

## 2022-02-09 MED ORDER — OXYCODONE HCL 5 MG PO TABS: ORAL_TABLET | ORAL | 0 refills | Status: AC

## 2022-02-09 MED ORDER — PHENYLEPHRINE HCL-NACL 20-0.9 MG/250ML-% IV SOLN
INTRAVENOUS | Status: DC | PRN
  Administered 2022-02-09: 100 ug/min via INTRAVENOUS

## 2022-02-09 MED ORDER — TRANEXAMIC ACID-NACL 1000-0.7 MG/100ML-% IV SOLN
1000.0000 mg | INTRAVENOUS | Status: AC
  Administered 2022-02-09: 1000 mg via INTRAVENOUS
  Filled 2022-02-09: qty 100

## 2022-02-09 MED ORDER — SUCCINYLCHOLINE CHLORIDE 200 MG/10ML IV SOSY
PREFILLED_SYRINGE | INTRAVENOUS | Status: AC
  Filled 2022-02-09: qty 10

## 2022-02-09 MED ORDER — LIDOCAINE HCL (PF) 2 % IJ SOLN
INTRAMUSCULAR | Status: AC
  Filled 2022-02-09: qty 10

## 2022-02-09 MED ORDER — FENTANYL CITRATE PF 50 MCG/ML IJ SOSY
50.0000 ug | PREFILLED_SYRINGE | INTRAMUSCULAR | Status: DC
  Administered 2022-02-09: 50 ug via INTRAVENOUS
  Filled 2022-02-09: qty 2

## 2022-02-09 MED ORDER — ONDANSETRON HCL 4 MG/2ML IJ SOLN
INTRAMUSCULAR | Status: AC
  Filled 2022-02-09: qty 4

## 2022-02-09 MED ORDER — AMISULPRIDE (ANTIEMETIC) 5 MG/2ML IV SOLN
10.0000 mg | Freq: Once | INTRAVENOUS | Status: AC | PRN
  Administered 2022-02-09: 10 mg via INTRAVENOUS

## 2022-02-09 MED ORDER — VANCOMYCIN HCL 1 G IV SOLR
INTRAVENOUS | Status: DC | PRN
  Administered 2022-02-09: 1000 mg via TOPICAL

## 2022-02-09 MED ORDER — FENTANYL CITRATE PF 50 MCG/ML IJ SOSY
PREFILLED_SYRINGE | INTRAMUSCULAR | Status: AC
  Filled 2022-02-09: qty 1

## 2022-02-09 MED ORDER — LACTATED RINGERS IV BOLUS
250.0000 mL | Freq: Once | INTRAVENOUS | Status: AC
  Administered 2022-02-09: 250 mL via INTRAVENOUS

## 2022-02-09 MED ORDER — OXYCODONE HCL 5 MG/5ML PO SOLN: 5.0000 mg | Freq: Once | ORAL | Status: DC | PRN

## 2022-02-09 MED ORDER — SODIUM CHLORIDE (PF) 0.9 % IJ SOLN
INTRAMUSCULAR | Status: AC
  Filled 2022-02-09: qty 10

## 2022-02-09 MED ORDER — DEXAMETHASONE SODIUM PHOSPHATE 10 MG/ML IJ SOLN
INTRAMUSCULAR | Status: DC | PRN
  Administered 2022-02-09: 5 mg via INTRAVENOUS

## 2022-02-09 MED ORDER — ONDANSETRON HCL 4 MG PO TABS: 4.0000 mg | ORAL_TABLET | Freq: Three times a day (TID) | ORAL | 0 refills | Status: AC | PRN

## 2022-02-09 MED ORDER — ESMOLOL HCL 100 MG/10ML IV SOLN
INTRAVENOUS | Status: AC
  Filled 2022-02-09: qty 10

## 2022-02-09 SURGICAL SUPPLY — 68 items
BAG COUNTER SPONGE SURGICOUNT (BAG) ×2 IMPLANT
BASEPLATE GLENOID RSA 3X25 0D (Shoulder) ×1 IMPLANT
BIT DRILL 3.2 PERIPHERAL SCREW (BIT) ×1 IMPLANT
BLADE SAW SAG 73X25 THK (BLADE) ×1
BLADE SAW SGTL 73X25 THK (BLADE) ×1 IMPLANT
CHLORAPREP W/TINT 26 (MISCELLANEOUS) ×4 IMPLANT
CLSR STERI-STRIP ANTIMIC 1/2X4 (GAUZE/BANDAGES/DRESSINGS) ×2 IMPLANT
COOLER ICEMAN CLASSIC (MISCELLANEOUS) ×1 IMPLANT
COVER BACK TABLE 60X90IN (DRAPES) ×1 IMPLANT
COVER SURGICAL LIGHT HANDLE (MISCELLANEOUS) ×2 IMPLANT
DRAPE C-ARM 42X120 X-RAY (DRAPES) IMPLANT
DRAPE INCISE IOBAN 66X45 STRL (DRAPES) ×2 IMPLANT
DRAPE ORTHO SPLIT 77X108 STRL (DRAPES) ×4
DRAPE SHEET LG 3/4 BI-LAMINATE (DRAPES) ×4 IMPLANT
DRAPE SURG ORHT 6 SPLT 77X108 (DRAPES) ×2 IMPLANT
DRSG AQUACEL AG ADV 3.5X 6 (GAUZE/BANDAGES/DRESSINGS) ×2 IMPLANT
ELECT BLADE TIP CTD 4 INCH (ELECTRODE) ×2 IMPLANT
ELECT PENCIL ROCKER SW 15FT (MISCELLANEOUS) IMPLANT
ELECT REM PT RETURN 15FT ADLT (MISCELLANEOUS) ×2 IMPLANT
FACESHIELD WRAPAROUND (MASK) ×4 IMPLANT
FACESHIELD WRAPAROUND OR TEAM (MASK) ×1 IMPLANT
GLENOSPHERE CANN SHLD +3 36 (Shoulder) ×1 IMPLANT
GLOVE BIO SURGEON STRL SZ 6.5 (GLOVE) ×4 IMPLANT
GLOVE BIOGEL PI IND STRL 6.5 (GLOVE) ×1 IMPLANT
GLOVE BIOGEL PI IND STRL 8 (GLOVE) ×1 IMPLANT
GLOVE BIOGEL PI INDICATOR 6.5 (GLOVE) ×1
GLOVE BIOGEL PI INDICATOR 8 (GLOVE) ×1
GLOVE ECLIPSE 8.0 STRL XLNG CF (GLOVE) ×4 IMPLANT
GOWN STRL REUS W/ TWL LRG LVL3 (GOWN DISPOSABLE) ×1 IMPLANT
GOWN STRL REUS W/TWL LRG LVL3 (GOWN DISPOSABLE) ×2
GUIDE PIN 3X75 SHOULDER (PIN) ×2
GUIDEWIRE GLENOID 2.5X220 (WIRE) ×1 IMPLANT
HANDPIECE INTERPULSE COAX TIP (DISPOSABLE) ×2
INSERT HUMERAL +3 SZ1/2 .36 (Joint) ×1 IMPLANT
KIT BASIN OR (CUSTOM PROCEDURE TRAY) ×2 IMPLANT
KIT STABILIZATION SHOULDER (MISCELLANEOUS) ×2 IMPLANT
KIT TURNOVER KIT A (KITS) ×1 IMPLANT
MANIFOLD NEPTUNE II (INSTRUMENTS) ×2 IMPLANT
NDL MAYO CATGUT SZ4 TPR NDL (NEEDLE) IMPLANT
NEEDLE MAYO CATGUT SZ4 (NEEDLE) IMPLANT
NS IRRIG 1000ML POUR BTL (IV SOLUTION) ×2 IMPLANT
PACK SHOULDER (CUSTOM PROCEDURE TRAY) ×2 IMPLANT
PAD COLD SHLDR WRAP-ON (PAD) ×1 IMPLANT
PAD ORTHO SHOULDER 7X19 LRG (SOFTGOODS) ×1 IMPLANT
PIN GUIDE 3X75 SHOULDER (PIN) IMPLANT
RESTRAINT HEAD UNIVERSAL NS (MISCELLANEOUS) ×2 IMPLANT
SCREW 5.5X22 (Screw) ×1 IMPLANT
SCREW BONE THREAD 6.5X35 (Screw) ×1 IMPLANT
SCREW PERIPHERAL 30 (Screw) ×1 IMPLANT
SET HNDPC FAN SPRY TIP SCT (DISPOSABLE) ×1 IMPLANT
SLING ULTRA II L (ORTHOPEDIC SUPPLIES) IMPLANT
SLING ULTRA III MED (ORTHOPEDIC SUPPLIES) ×1 IMPLANT
SPONGE T-LAP 18X18 ~~LOC~~+RFID (SPONGE) ×2 IMPLANT
SPONGE T-LAP 4X18 ~~LOC~~+RFID (SPONGE) ×2 IMPLANT
STEM HUMERAL PLUS SHORT SZ2+ (Orthopedic Implant) ×1 IMPLANT
SUCTION FRAZIER HANDLE 12FR (TUBING) ×2
SUCTION TUBE FRAZIER 12FR DISP (TUBING) ×1 IMPLANT
SUT ETHIBOND 2 V 37 (SUTURE) ×2 IMPLANT
SUT ETHIBOND NAB CT1 #1 30IN (SUTURE) ×2 IMPLANT
SUT FIBERWIRE #5 38 CONV NDL (SUTURE) ×8
SUT MNCRL AB 4-0 PS2 18 (SUTURE) ×2 IMPLANT
SUT VIC AB 0 CT1 36 (SUTURE) IMPLANT
SUT VIC AB 3-0 SH 27 (SUTURE) ×2
SUT VIC AB 3-0 SH 27X BRD (SUTURE) ×1 IMPLANT
SUTURE FIBERWR #5 38 CONV NDL (SUTURE) IMPLANT
TOWEL OR 17X26 10 PK STRL BLUE (TOWEL DISPOSABLE) ×2 IMPLANT
TUBE SUCTION HIGH CAP CLEAR NV (SUCTIONS) ×2 IMPLANT
WATER STERILE IRR 1000ML POUR (IV SOLUTION) ×4 IMPLANT

## 2022-02-09 NOTE — Discharge Instructions (Signed)
Ramond Marrow MD, MPH ?Alfonse Alpers, PA-C ?[facility] ?[address], [address] ?[phone] (tel)   ?[phone] (fax) ? ? ?POST-OPERATIVE INSTRUCTIONS - TOTAL SHOULDER REPLACEMENT  ? ? ?WOUND CARE ?You may leave the operative dressing in place until your follow-up appointment. ?KEEP THE INCISIONS CLEAN AND DRY. ?There may be a small amount of fluid/bleeding leaking at the surgical site. This is normal after surgery.  ?If it fills with liquid or blood please call us immediately to change it for you. ?Use the provided ice machine or Ice packs as often as possible for the first 3-4 days, then as needed for pain relief.   ?Keep a layer of cloth or a shirt between your skin and the cooling unit to prevent frost bite as it can get very cold. ? ?SHOWERING: ?- You may shower on Post-Op Day #2.  ?- The dressing is water resistant but do not scrub it as it may start to peel up.   ?- You may remove the sling for showering ?- Gently pat the area dry.  ?- Do not soak the shoulder in water. Do not go swimming in the pool or ocean until your incision has completely healed (about 4-6 weeks after surgery) ?- KEEP THE INCISIONS CLEAN AND DRY. ? ?EXERCISES ?Wear the sling at all times ?You may remove the sling for showering, but keep the arm across the chest or in a secondary sling.    ?Accidental/Purposeful External Rotation and shoulder flexion (reaching behind you) is to be avoided at all costs for the first month. ?It is ok to come out of your sling if your are sitting and have assistance for eating.   ?Do not lift anything heavier than 1 pound until we discuss it further in clinic. ? ?REGIONAL ANESTHESIA (NERVE BLOCKS) ?The anesthesia team may have performed a nerve block for you if safe in the setting of your care.  This is a great tool used to minimize pain.  Typically the block may start wearing off overnight but the long acting medicine may last for 3-4 days.  The nerve block wearing off can be a  challenging period but please utilize your as needed pain medications to try and manage this period.   ? ?POST-OP MEDICATIONS- Multimodal approach to pain control ?In general your pain will be controlled with a combination of substances.  Prescriptions unless otherwise discussed are electronically sent to your pharmacy.  This is a carefully made plan we use to minimize narcotic use.    ? ?Meloxicam - Anti-inflammatory medication taken on a scheduled basis ?Acetaminophen - Non-narcotic pain medicine taken on a scheduled basis  ?Oxycodone - This is a strong narcotic, to be used only on an ?as needed? basis for SEVERE pain. ?Aspirin 81mg  - This medicine is used to minimize the risk of blood clots after surgery. ?Zofran -  take as needed for nausea ? ? ?FOLLOW-UP ?If you develop a Fever (>101.5), Redness or Drainage from the surgical incision site, please call our office to arrange for an evaluation. ?Please call the office to schedule a follow-up appointment for a wound check, 7-10 days post-operatively. ? ?IF YOU HAVE ANY QUESTIONS, PLEASE FEEL FREE TO CALL OUR OFFICE. ? ?HELPFUL INFORMATION ? ?If you had a block, it will wear off between 8-24 hrs postop typically.  This is period when your pain may go from nearly zero to the pain you would have had post-op without the block.  This is an abrupt transition but nothing  dangerous is happening.  You may take an extra dose of narcotic when this happens. ? ?Your arm will be in a sling following surgery. You will be in this sling for the next 4 weeks. ? ?You may be more comfortable sleeping in a semi-seated position the first few nights following surgery.  Keep a pillow propped under the elbow and forearm for comfort.  If you have a recliner type of chair it might be beneficial.  If not that is fine too, but it would be helpful to sleep propped up with pillows behind your operated shoulder as well under your elbow and forearm.  This will reduce pulling on the suture  lines. ? ?When dressing, put your operative arm in the sleeve first.  When getting undressed, take your operative arm out last.  Loose fitting, button-down shirts are recommended. ? ?In most states it is against the law to drive while your arm is in a sling. And certainly against the law to drive while taking narcotics. ? ?You may return to work/school in the next couple of days when you feel up to it. Desk work and typing in the sling is fine. ? ?We suggest you use the pain medication the first night prior to going to bed, in order to ease any pain when the anesthesia wears off. You should avoid taking pain medications on an empty stomach as it will make you nauseous. ? ?Do not drink alcoholic beverages or take illicit drugs when taking pain medications. ? ?Pain medication may make you constipated.  Below are a few solutions to try in this order: ?Decrease the amount of pain medication if you aren?t having pain. ?Drink lots of decaffeinated fluids. ?Drink prune juice and/or each dried prunes ? ?If the first 3 don?t work start with additional solutions ?Take Colace - an over-the-counter stool softener ?Take Senokot - an over-the-counter laxative ?Take Miralax - a stronger over-the-counter laxative ? ? ?Dental Antibiotics: ? ?In most cases prophylactic antibiotics for Dental procdeures after total joint surgery are not necessary. ? ?Exceptions are as follows: ? ?1. History of prior total joint infection ? ?2. Severely immunocompromised (Organ Transplant, cancer chemotherapy, Rheumatoid biologic ?meds such as Humera) ? ?3. Poorly controlled diabetes (A1C &gt; 8.0, blood glucose over 200) ? ?If you have one of these conditions, contact your surgeon for an antibiotic prescription, prior to your ?dental procedure. ? ? ?For more information including helpful videos and documents visit our website:  ? ?[email or web address] ? ? ? ?

## 2022-02-09 NOTE — Anesthesia Postprocedure Evaluation (Signed)
Anesthesia Post Note ? ?Patient: Enrique Weiss ? ?Procedure(s) Performed: REVERSE SHOULDER ARTHROPLASTY (Right: Shoulder) ? ?  ? ?Patient location during evaluation: PACU ?Anesthesia Type: Regional and General ?Level of consciousness: awake ?Pain management: pain level controlled ?Vital Signs Assessment: post-procedure vital signs reviewed and stable ?Respiratory status: spontaneous breathing and respiratory function stable ?Cardiovascular status: stable ?Postop Assessment: no apparent nausea or vomiting ?Anesthetic complications: no ? ? ?No notable events documented. ? ?Last Vitals:  ?Vitals:  ? 02/09/22 1400 02/09/22 1430  ?BP: (!) 184/79 (!) 184/68  ?Pulse: 69 66  ?Resp: 19   ?Temp:  36.6 ?C  ?SpO2: 98% 93%  ?  ?Last Pain:  ?Vitals:  ? 02/09/22 1430  ?TempSrc:   ?PainSc: 3   ? ? ?  ?  ?  ?  ?  ?  ? ?Mellody Dance ? ? ? ? ?

## 2022-02-09 NOTE — Interval H&P Note (Signed)
All questions answered

## 2022-02-09 NOTE — Anesthesia Procedure Notes (Signed)
Date/Time: 02/09/2022 1:36 PM ?Performed by: Cynda Familia, CRNA ?Oxygen Delivery Method: Simple face mask ?Placement Confirmation: positive ETCO2 and breath sounds checked- equal and bilateral ?Dental Injury: Teeth and Oropharynx as per pre-operative assessment  ? ? ? ? ?

## 2022-02-09 NOTE — Anesthesia Procedure Notes (Signed)
Procedure Name: Intubation ?Date/Time: 02/09/2022 12:27 PM ?Performed by: Nelle Don, CRNA ?Pre-anesthesia Checklist: Patient identified, Emergency Drugs available, Suction available and Patient being monitored ?Patient Re-evaluated:Patient Re-evaluated prior to induction ?Oxygen Delivery Method: Circle system utilized ?Preoxygenation: Pre-oxygenation with 100% oxygen ?Induction Type: IV induction ?Ventilation: Oral airway inserted - appropriate to patient size and Two handed mask ventilation required ?Laryngoscope Size: Glidescope and 3 ?Grade View: Grade I ?Tube type: Oral ?Tube size: 7.0 mm ?Number of attempts: 1 ?Airway Equipment and Method: Stylet and Video-laryngoscopy ?Placement Confirmation: ETT inserted through vocal cords under direct vision, positive ETCO2 and breath sounds checked- equal and bilateral ?Secured at: 22 cm ?Tube secured with: Tape ?Dental Injury: Teeth and Oropharynx as per pre-operative assessment  ?Comments: Elective glidescope used given ACDF, neck maintained neutral.  ? ? ? ? ?

## 2022-02-09 NOTE — Op Note (Signed)
Orthopaedic Surgery Operative Note (CSN: [id number]) ? ?Regina Marsh  07-03-1946 ?Date of Surgery: 02/09/2022 ? ? ?Diagnoses:  ?Massive irreparable right rotator cuff tear ? ?Procedure: ?Right reverse lateralized total Shoulder Arthroplasty ?  ?Operative Finding ?Successful completion of planned procedure.  Good bone quality on the glenoid though the humeral bone quality was relatively poor.  Actually nerve tug test was normal.  Supra infra and subscapularis were all torn.  The remnant of the subscapularis was repaired. ? ?Post-operative plan: The patient will be NWB in sling.  The patient will be will be discharged from PACU if continues to be stable as was plan prior to surgery.  DVT prophylaxis not indicated in this ambulatory upper extremity patient without significant risk factors.  Pain control with PRN pain medication preferring oral medicines.  Follow up plan will be scheduled in approximately 7 days for incision check and XR.  Physical therapy to start as soon as possible. ? ?Implants: Tornier perform humeral 2+ stem, +3 polyethylene, 36+3 glenosphere, 25+3 baseplate with a 35 center screw and 2 peripheral locking screws ? ?Post-Op Diagnosis: Same ?Surgeons:Primary: Bjorn Pippin, MD ?Assistants:Caroline McBane PA-C ?Location: [facility] ROOM 06 ?Anesthesia: General with Exparel Interscalene ?Antibiotics: Ancef 2g preop, Vancomycin 1000mg  locally ?Tourniquet time: None ?Estimated Blood Loss: 100 ?Complications: None ?Specimens: None ?Implants: ?Implant Name Type Inv. Item Serial No. Manufacturer Lot No. LRB No. Used Action  ?BONE SCREW THREAD 6.5X35MM - Screw BONE SCREW THREAD 6.5X35MM  [employer]  Right 1 Implanted  ?BASEPLATE GLENOID RSA 3X25 0D - [id number] Shoulder BASEPLATE GLENOID RSA 3X25 0D [id number] [employer]  Right 1 Implanted  ?GLENOSPHERE CANN SHLD +3 36 - [id number] Shoulder GLENOSPHERE CANN SHLD +3 36 [id number] [employer]  Right 1 Implanted  ?INSERT HUMERAL +3 SZ1/2 .36 -  [id number] Joint INSERT HUMERAL +3 SZ1/2 .36 [id number] [employer]  Right 1 Implanted  ?humeral stem   [id number] [employer]  Right 1 Implanted  ?SCREW PERIPHERAL 30 - [id number] Screw SCREW PERIPHERAL 30  [employer]  Right 1 Implanted  ?SCREW 5.5X22 - [id number] Screw SCREW 5.5X22  [employer]  Right 1 Implanted  ? ? ?Indications for Surgery:   ?Regina Marsh is a 76 y.o. female with irreparable rotator cuff tear.  Benefits and risks of operative and nonoperative management were discussed prior to surgery with patient/guardian(s) and informed consent form was completed.  Infection and need for further surgery were discussed as was prosthetic stability and cuff issues.  We additionally specifically discussed risks of axillary nerve injury, infection, periprosthetic fracture, continued pain and longevity of implants prior to beginning procedure.   ? ? ? ?Procedure:   ?The patient was identified in the preoperative holding area where the surgical site was marked. Block placed by anesthesia with exparel.  The patient was taken to the OR where a procedural timeout was called and the above noted anesthesia was induced.  The patient was positioned beachchair on allen table with spider arm positioner.  Preoperative antibiotics were dosed.  The patient's right shoulder was prepped and draped in the usual sterile fashion.  A second preoperative timeout was called.     ? ?Standard deltopectoral approach was performed with a #10 blade. We dissected down to the subcutaneous tissues and the cephalic vein was taken laterally with the deltoid. Clavipectoral fascia was incised in line with the incision. Deep retractors were placed. The long of the biceps tendon was identified and there was significant tenosynovitis present.  Tenodesis was performed to the pectoralis  tendon with #2 Ethibond. The remaining biceps was followed up into the rotator interval where it was released.  ? ?The subscapularis was taken down in a full  thickness layer with capsule along the humeral neck extending inferiorly around the humeral head. We continued releasing the capsule directly off of the osteophytes inferiorly all the way around the corner. This allowed us to dislocate the humeral head.  ? ?The rotator cuff was carefully examined and noted to be irreperably torn.  The decision was confirmed that a reverse total shoulder was indicated for this patient. ? ?There were osteophytes along the inferior humeral neck. The osteophytes were removed with an osteotome and a rongeur.  Osteophytes were removed with a rongeur and an osteotome and the anatomic neck was well visualized.    ? ?A humeral cutting guide was used extra medullary with a pin to help control version. The version was set at 20? of retroversion. Humeral osteotomy was performed with an oscillating saw. The head fragment was passed off the back table.  A cut protector plate was placed. ? ?The subscapularis was again identified and immediately we took care to palpate the axillary nerve anteriorly and verify its position with gentle palpation as well as the tug test.  We then released the SGHL with bovie cautery prior to placing a curved mayo at the junction of the anterior glenoid well above the axillary nerve and bluntly dissecting the subscapularis from the capsule.  We then carefully protected the axillary nerve as we gently released the inferior capsule to fully mobilize the subscapularis.  An anterior deltoid retractor was then placed as well as a small Hohmann retractor superiorly. ? ?The glenoid was relatively intact in the setting of an irreparable cuff tear ? ?The remaining labrum was removed circumferentially taking great care not to disrupt the posterior capsule.  ? ?The glenoid drill guide was placed and used to drill a guide pin in the center, inferior position. The glenoid face was then reamed concentrically over the guide wire. The center hole was drilled over the guidepin in a  near anatomic angle of version. Next the glenoid vault was drilled back to a depth of 35 mm.  We tapped and then placed a 25mm size baseplate with additional 3mm lateralization was selected with a 6.5 mm x 35 mm length central screw.  The base plate was screwed into the glenoid vault obtaining secure fixation. We next placed superior and inferior locking screws for additional fixation.  Next a 36 +3 mm glenosphere was selected and impacted onto the baseplate. The center screw was tightened. ? ?We turned attention back to the humeral side. The cut protector was removed.  We used the perform humeral sizing block to select the appropriate size which for this patient was a 2.  We then placed our center pin and reamed over it concentrically obtaining appropriate inset.  We then used our lateralizing chisel to prepare the lateral aspect of the humerus.  At that point we selected the appropriate implant trialing a 2+.  Using this trial implant we trialed multiple polyethylene sizes settling on a +3 which provided good stability and range of motion without excess soft tissue tension. The offset was dialed in to match the normal anatomy. The shoulder was trialed.  There was good ROM in all planes and the shoulder was stable with no inferior translation. ? ?The real humeral implants were opened after again confirming sizes.  The trial was removed. #5 Fiberwire x4 sutures passed through  the humeral neck for subscap repair. The humeral component was press-fit obtaining a secure fit. The joint was reduced and thoroughly irrigated with pulsatile lavage. Subscap was repaired back with #5 Fiberwire sutures through bone tunnels. Hemostasis was obtained. The deltopectoral interval was reapproximated with #1 Ethibond. The subcutaneous tissues were closed with 2-0 Vicryl and the skin was closed with running monocryl.   ? ?The wounds were cleaned and dried and an Aquacel dressing was placed. The drapes taken down. The arm was placed  into sling with abduction pillow. Patient was awakened, extubated, and transferred to the recovery room in stable condition. There were no intraoperative complications. The sponge, needle, and attention counts were

## 2022-02-09 NOTE — Progress Notes (Signed)
Assisted Dr. Bass with right, interscalene , ultrasound guided block. Side rails up, monitors on throughout procedure. See vital signs in flow sheet. Tolerated Procedure well. ?

## 2022-02-09 NOTE — Anesthesia Procedure Notes (Signed)
Anesthesia Regional Block: Interscalene brachial plexus block  ? ?Pre-Anesthetic Checklist: , timeout performed,  Correct Patient, Correct Site, Correct Laterality,  Correct Procedure, Correct Position, site marked,  Risks and benefits discussed,  Surgical consent,  Pre-op evaluation,  At surgeon's request and post-op pain management ? ?Laterality: Right ? ?Prep: chloraprep     ?  ?Needles:  ?Injection technique: Single-shot ? ?Needle Type: Echogenic Stimulator Needle   ? ? ?Needle Length: 10cm  ?Needle Gauge: 20  ? ? ? ?Additional Needles: ? ? ?Procedures:,,,, ultrasound used (permanent image in chart),,    ?Narrative:  ?Start time: 02/09/2022 11:10 AM ?End time: 02/09/2022 11:15 AM ?Injection made incrementally with aspirations every 5 mL. ? ?Performed by: Personally  ?Anesthesiologist: Mellody Dance, MD ? ?Additional Notes: ?Standard monitors applied. Skin prepped. Good needle visualization with ultrasound. Injection made in 5cc increments with no resistance to injection. Patient tolerated the procedure well. ? ? ? ? ?

## 2022-02-09 NOTE — Anesthesia Preprocedure Evaluation (Addendum)
Anesthesia Evaluation  ?Patient identified by MRN, date of birth, ID band ?Patient awake ? ? ? ?Reviewed: ?Allergy & Precautions, NPO status , Patient's Chart, lab work & pertinent test results ? ?Airway ?Mallampati: III ? ?TM Distance: >3 FB ?Neck ROM: Limited ? ? ? Dental ?no notable dental hx. ? ?  ?Pulmonary ?neg pulmonary ROS,  ?  ?Pulmonary exam normal ?breath sounds clear to auscultation ? ? ? ? ? ? Cardiovascular ?hypertension, Pt. on medications ?Normal cardiovascular exam ?Rhythm:Regular Rate:Normal ? ?02/04/22 ECHO ?Left ventricular ejection fraction, by estimation, is 60 to 65%. The left ventricle has ?normal function. The left ventricle has no regional wall motion abnormalities. There is ?mild concentric left ventricular hypertrophy. Left ventricular diastolic parameters are ?consistent with Grade I diastolic dysfunction (impaired relaxation). Elevated left ?ventricular end-diastolic pressure. ?1. ?Right ventricular systolic function is normal. The right ventricular size is normal. ?There is normal pulmonary artery systolic pressure. ?2. ?3. Left atrial size was mildly dilated. ?The mitral valve is normal in structure. Trivial mitral valve regurgitation. No evidence ?of mitral stenosis. ?4. ?The aortic valve is tricuspid. Aortic valve regurgitation is not visualized. No aortic ?stenosis is present. ?5. ?The inferior vena cava is normal in size with greater than 50% respiratory variability, ?suggesting right atrial pressure of 3 mmHg. ?  ?Neuro/Psych ? Headaches, negative psych ROS  ? GI/Hepatic ?negative GI ROS, Neg liver ROS, GERD  Medicated,  ?Endo/Other  ?hyperlipidemia ? Renal/GU ?negative Renal ROS  ?negative genitourinary ?  ?Musculoskeletal ?negative musculoskeletal ROS ?(+)  ? Abdominal ?  ?Peds ?negative pediatric ROS ?(+)  Hematology ?negative hematology ROS ?(+)   ?Anesthesia Other Findings ?Sjogren's ? Reproductive/Obstetrics ?negative OB ROS ? ?   ? ? ? ? ? ? ? ? ? ? ? ? ? ?  ?  ? ? ? ? ? ? ? ?Anesthesia Physical ?Anesthesia Plan ? ?ASA: 2 ? ?Anesthesia Plan: General and Regional  ? ?Post-op Pain Management: Regional block* and Tylenol PO (pre-op)*  ? ?Induction: Intravenous ? ?PONV Risk Score and Plan: 3 and Treatment may vary due to age or medical condition, Ondansetron and Dexamethasone ? ?Airway Management Planned: Oral ETT and Video Laryngoscope Planned ? ?Additional Equipment: None ? ?Intra-op Plan:  ? ?Post-operative Plan: Extubation in OR ? ?Informed Consent:  ? ?Plan Discussed with: CRNA, Anesthesiologist and Surgeon ? ?Anesthesia Plan Comments:   ? ? ? ? ? ?Anesthesia Quick Evaluation ? ?

## 2022-02-09 NOTE — Transfer of Care (Signed)
Immediate Anesthesia Transfer of Care Note ? ?Patient: Regina Marsh ? ?Procedure(s) Performed: REVERSE SHOULDER ARTHROPLASTY (Right: Shoulder) ? ?Patient Location: PACU ? ?Anesthesia Type:General ? ?Level of Consciousness: sedated ? ?Airway & Oxygen Therapy: Patient Spontanous Breathing and Patient connected to face mask oxygen ? ?Post-op Assessment: Report given to RN and Post -op Vital signs reviewed and stable ? ?Post vital signs: Reviewed and stable ? ?Last Vitals:  ?Vitals Value Taken Time  ?BP 183/78 02/09/22 1345  ?Temp    ?Pulse 65 02/09/22 1346  ?Resp 11 02/09/22 1346  ?SpO2 100 % 02/09/22 1346  ?Vitals shown include unvalidated device data. ? ?Last Pain:  ?Vitals:  ? 02/09/22 1146  ?TempSrc:   ?PainSc: 0-No pain  ?   ? ?Patients Stated Pain Goal: 5 (02/09/22 1106) ? ?Complications: No notable events documented. ?

## 2022-02-10 ENCOUNTER — Encounter (HOSPITAL_COMMUNITY): Payer: Self-pay | Admitting: Orthopaedic Surgery

## 2022-02-14 DIAGNOSIS — S46011D Strain of muscle(s) and tendon(s) of the rotator cuff of right shoulder, subsequent encounter: Secondary | ICD-10-CM | POA: Diagnosis not present

## 2022-02-18 DIAGNOSIS — M19011 Primary osteoarthritis, right shoulder: Secondary | ICD-10-CM | POA: Diagnosis not present

## 2022-02-21 DIAGNOSIS — S46011D Strain of muscle(s) and tendon(s) of the rotator cuff of right shoulder, subsequent encounter: Secondary | ICD-10-CM | POA: Diagnosis not present

## 2022-02-23 DIAGNOSIS — S46011D Strain of muscle(s) and tendon(s) of the rotator cuff of right shoulder, subsequent encounter: Secondary | ICD-10-CM | POA: Diagnosis not present

## 2022-02-28 DIAGNOSIS — S46011D Strain of muscle(s) and tendon(s) of the rotator cuff of right shoulder, subsequent encounter: Secondary | ICD-10-CM | POA: Diagnosis not present

## 2022-03-02 DIAGNOSIS — S46011D Strain of muscle(s) and tendon(s) of the rotator cuff of right shoulder, subsequent encounter: Secondary | ICD-10-CM | POA: Diagnosis not present

## 2022-03-08 DIAGNOSIS — M4712 Other spondylosis with myelopathy, cervical region: Secondary | ICD-10-CM | POA: Diagnosis not present

## 2022-03-08 DIAGNOSIS — Z6830 Body mass index (BMI) 30.0-30.9, adult: Secondary | ICD-10-CM | POA: Diagnosis not present

## 2022-03-09 DIAGNOSIS — S46011D Strain of muscle(s) and tendon(s) of the rotator cuff of right shoulder, subsequent encounter: Secondary | ICD-10-CM | POA: Diagnosis not present

## 2022-03-11 DIAGNOSIS — S46011D Strain of muscle(s) and tendon(s) of the rotator cuff of right shoulder, subsequent encounter: Secondary | ICD-10-CM | POA: Diagnosis not present

## 2022-03-14 DIAGNOSIS — S46011D Strain of muscle(s) and tendon(s) of the rotator cuff of right shoulder, subsequent encounter: Secondary | ICD-10-CM | POA: Diagnosis not present

## 2022-03-16 DIAGNOSIS — S46011D Strain of muscle(s) and tendon(s) of the rotator cuff of right shoulder, subsequent encounter: Secondary | ICD-10-CM | POA: Diagnosis not present

## 2022-03-21 DIAGNOSIS — S46011D Strain of muscle(s) and tendon(s) of the rotator cuff of right shoulder, subsequent encounter: Secondary | ICD-10-CM | POA: Diagnosis not present

## 2022-03-24 DIAGNOSIS — S46011D Strain of muscle(s) and tendon(s) of the rotator cuff of right shoulder, subsequent encounter: Secondary | ICD-10-CM | POA: Diagnosis not present

## 2022-03-28 DIAGNOSIS — S46011D Strain of muscle(s) and tendon(s) of the rotator cuff of right shoulder, subsequent encounter: Secondary | ICD-10-CM | POA: Diagnosis not present

## 2022-03-29 ENCOUNTER — Ambulatory Visit: Payer: Medicare Other | Admitting: Allergy

## 2022-03-30 ENCOUNTER — Encounter (HOSPITAL_BASED_OUTPATIENT_CLINIC_OR_DEPARTMENT_OTHER): Payer: Self-pay | Admitting: Cardiology

## 2022-03-30 ENCOUNTER — Ambulatory Visit (HOSPITAL_BASED_OUTPATIENT_CLINIC_OR_DEPARTMENT_OTHER): Payer: Medicare Other | Admitting: Cardiology

## 2022-03-30 VITALS — BP 190/100 | HR 86 | Ht 62.0 in | Wt 168.0 lb

## 2022-03-30 DIAGNOSIS — Z7189 Other specified counseling: Secondary | ICD-10-CM

## 2022-03-30 DIAGNOSIS — I10 Essential (primary) hypertension: Secondary | ICD-10-CM

## 2022-03-30 DIAGNOSIS — E782 Mixed hyperlipidemia: Secondary | ICD-10-CM

## 2022-03-30 DIAGNOSIS — I2510 Atherosclerotic heart disease of native coronary artery without angina pectoris: Secondary | ICD-10-CM | POA: Insufficient documentation

## 2022-03-30 DIAGNOSIS — Z789 Other specified health status: Secondary | ICD-10-CM

## 2022-03-30 MED ORDER — AMLODIPINE BESYLATE 5 MG PO TABS: 5.0000 mg | ORAL_TABLET | Freq: Every day | ORAL | 3 refills | Status: DC

## 2022-03-30 NOTE — Progress Notes (Signed)
Cardiology Office Note:    Date:  03/30/2022   ID:  Regina Marsh, DOB [birth year], MRN [id number]  PCP:  Regina Smoker, MD  Cardiologist:  Buford Dresser, MD  Referring MD: Regina Marsh, *   CC: follow up  History of Present Illness:    Regina Marsh is a 76 y.o. female with a hx of Sjogren's, hypertension, hyperlipidemia, and GERD, here for follow up. I met her 06/23/20 as a new consult at the request of Regina Marsh, * for the evaluation and management of resistant hypertension.  Cardiac history:  metanephrines that are within normal limits (metanephrine 48, normetanephrine 141). ER note from 03/27/20. Noted to have elevated blood pressure after she had nasal packing for a nosebleed. No associated symptoms. BP noted as 214/91 at that time. Long history of drug intolerances, see summary below.  Today Recent orthopedic shoulder surgery. She had been recovering well until about 1.5 weeks ago. She could not tolerate the Meloxicam for inflammation of her shoulder as it raised her blood pressure and cause her to gain weight. She tried celebrex but it gave her severe abdominal discomfort.  Taking Tylenol does not improve her shoulder pain, and she is asking for for alternatives today. Discussed that her surgeon is the best contact for inflammatory/pain control, but we can work on her blood pressure.  She has an extensive history of medication intolerances, summarized below. She has only tolerated HCTZ. She notes that she only had lightheadedness on one of the early medications (on review, this appears to be amlodipine) vs. Multiple issues with many of the other medications. She believes her lightheadedness came from her pinched nerve, so she is willing to try this medication again.  Of note, her husband has been diagnosed with prostate cancer and will need to undergo surgery in the near future.  The patient denies chest pain, shortness of breath, nocturnal  dyspnea, orthopnea or peripheral edema. There have been no palpitations, or syncope.  Complains of intense shoulder pain and discomfort and fatigue.   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 Take 2 tablets daily   diphenhydrAMINE (BENADRYL) 25 MG tablet Take 0.5 tablets by mouth daily as needed (allergies).   famotidine (PEPCID) 20 MG tablet Take 1 tablet (20 mg total) by mouth 2 (two) times daily.   Garlic (GARLIQUE PO) Take 1 tablet by mouth daily.   hydrochlorothiazide (HYDRODIURIL) 25 MG tablet TAKE ONE TABLET BY MOUTH DAILY (  Patient taking differently: Take 25 mg by mouth every morning.)   L-THEANINE PO Take 1 tablet by mouth daily as needed (pain).   Lactase (LACTAID PO) Take 1-3 tablets by mouth 3 (three) times daily as needed (when consuming milk products).   Lactobacillus (PROBIOTIC ACIDOPHILUS PO) Take 1 tablet by mouth daily.   Melatonin 500 MCG TBDP Take 500 mcg by mouth daily as needed (sleep).   Menatetrenone (VITAMIN K2) 100 MCG TABS Take 100 mcg by mouth daily.    OVER THE COUNTER MEDICATION Take 1,500 mg by mouth daily. Beet root   pantoprazole (PROTONIX) 40 MG tablet Take 1 tablet (40 mg total) by mouth daily. (Patient taking differently: Take 40 mg by mouth daily as needed (acid reflux/heartburn).)   Propylene Glycol (SYSTANE BALANCE OP) Place 1 drop into both eyes 3 (three) times daily as needed (dry eyes).   Simethicone (GAS-X PO) Take 1 tablet by mouth daily as needed (flatulance).   No current facility-administered medications on file prior to visit.     Allergies:   Aleve [naproxen], Allegra [fexofenadine], Amlodipine, Cetirizine hcl, Chlorthalidone, Lactose intolerance (gi), Lisinopril, Loratadine, Pravastatin, Simvastatin, Sulfa antibiotics, Valsartan, African mango [irvingia gabonensis], Penicillins, and Spironolactone   Social History   Tobacco Use   Smoking status: Never   Smokeless tobacco: Never  Vaping Use   Vaping Use: Never used  Substance Use Topics   Alcohol use: Yes    Comment: rare   Drug use: Never    Family History: No family history of premature CAD  ROS:   Please see the history of present illness.   (+) Shoulder pain/discomfort (+) Lightheadedness (+) Fatigue (+) Weight Gain (+) Stress Additional pertinent ROS otherwise unremarkable.   EKGs/Labs/Other Studies Reviewed:    The following studies were reviewed today:  Echo 02/04/2022:  IMPRESSIONS   1. Left ventricular ejection fraction, by estimation, is 60 to 65%. The  left ventricle has normal function. The left ventricle has no regional  wall motion abnormalities. There is mild concentric left ventricular  hypertrophy. Left ventricular diastolic  parameters are consistent with Grade I diastolic dysfunction (impaired  relaxation). Elevated left ventricular end-diastolic pressure.   2. Right ventricular systolic function is normal. The right ventricular  size is normal. There is normal pulmonary artery systolic pressure.   3. Left atrial size was  mildly dilated.   4. The mitral valve is normal in structure. Trivial mitral valve  regurgitation. No evidence of mitral stenosis.   5. The aortic valve is tricuspid. Aortic valve regurgitation is not  visualized. No aortic stenosis is present.   6. The inferior vena cava is normal in size with greater than 50%  respiratory variability, suggesting right atrial pressure of 3 mmHg.   FINDINGS   Left Ventricle: Left ventricular ejection fraction, by estimation, is 60  to 65%. The left ventricle has normal function. The left ventricle has no  regional wall motion abnormalities. The left ventricular internal cavity  size was normal in size. There is   mild concentric left ventricular hypertrophy. Left ventricular diastolic  parameters are consistent with Grade I diastolic dysfunction (impaired  relaxation). Elevated left ventricular end-diastolic pressure.   Right Ventricle: The right ventricular size is normal. No increase in  right ventricular wall thickness. Right ventricular systolic function is  normal. There is normal pulmonary artery systolic pressure. The tricuspid  regurgitant velocity is 2.16 m/s, and   with an assumed right atrial pressure of 3 mmHg, the estimated right  ventricular systolic  pressure is 21.7 mmHg.   Left Atrium: Left atrial size was mildly dilated.   Right Atrium: Right atrial size was normal in size.   Pericardium: There is no evidence of pericardial effusion.   Mitral Valve: The mitral valve is normal in structure. Trivial mitral  valve regurgitation. No evidence of mitral valve stenosis.   Tricuspid Valve: The tricuspid valve is normal in structure. Tricuspid  valve regurgitation is trivial. No evidence of tricuspid stenosis.   Aortic Valve: The aortic valve is tricuspid. Aortic valve regurgitation is  not visualized. No aortic stenosis is present.   Pulmonic Valve: The pulmonic valve was normal in structure. Pulmonic valve  regurgitation is trivial.  No evidence of pulmonic stenosis.   Aorta: The aortic root is normal in size and structure.   Venous: The inferior vena cava is normal in size with greater than 50%  respiratory variability, suggesting right atrial pressure of 3 mmHg.   IAS/Shunts: No atrial level shunt detected by color flow Doppler.   CTA Head/Neck 08/16/2021: FINDINGS: CT HEAD   Brain: There is no acute intracranial hemorrhage, mass effect, or edema. Gray-white differentiation is preserved. There is no extra-axial fluid collection. Ventricles and sulci are within normal limits in size configuration. Patchy low-density in the supratentorial white matter is nonspecific but may reflect chronic microvascular ischemic changes. 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   Vascular: There is atherosclerotic calcification at the skull base.   Skull: Calvarium is unremarkable.   Sinuses/Orbits: No acute finding.   Other: None.   Review of the MIP images confirms the above findings   CTA NECK   Aortic arch: Great vessel origins are patent.   Right carotid system: Patent. Minimal calcified plaque along the proximal internal carotid without stenosis.   Left carotid system: Patent. Eccentric noncalcified plaque along the common carotid with less than 50% stenosis. Noncalcified plaque along the proximal internal carotid with less than 50% stenosis.   Vertebral arteries: Patent and codominant.  No stenosis.   Skeleton: Degenerative changes of the cervical spine, greatest at C5-C6.   Other neck: No acute abnormality. Fatty atrophy or resected left submandibular gland.   Upper chest: No apical lung mass.   Review of the MIP images confirms the above findings   CTA HEAD   Suboptimal arterial evaluation due to venous enhancement.   Anterior circulation: Intracranial internal carotid arteries are patent. There is noncalcified plaque along the petrous portion  and inferior genu with mild stenosis. Calcified plaque along the cavernous segment with mild stenosis.   Occlusion of the right M1 MCA. There is reconstitution at the bifurcation. Left middle cerebral artery is patent with what appears to be an early bifurcation probable superimposed stenosis.   Anterior cerebral arteries are patent. Anterior communicating artery is present. Suspect stenosis of the proximal left A1 ACA.   Posterior circulation: Intracranial vertebral arteries are patent. Moderate stenosis on the right. Moderate to marked stenosis on the left just beyond PICA origin. There is likely stenosis at the left PICA origin. Basilar artery is patent with noncalcified plaque. Posterior cerebral arteries are poorly visualized and likely diffusely stenotic and irregular.   Venous sinuses: Patent as allowed by contrast bolus timing.   Review of the MIP images confirms the above findings   IMPRESSION: There is no acute intracranial hemorrhage or evidence of acute infarction. ASPECT score is 10.   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   Intracranial atherosclerosis. Moderate stenosis right vertebral artery and moderate to marked stenosis left vertebral artery just beyond PICA origin. There is stenosis at the left PICA origin. The posterior cerebral arteries are poorly visualized and likely diffusely irregular and stenotic. Probable early left MCA bifurcation with superimposed stenosis. Stenosis of proximal left A1 ACA.   No hemodynamically significant stenosis in the neck. Noncalcified plaque at the left ICA origin with less than 50% stenosis.   Initial results were called by telephone at the time of interpretation on 08/16/2021 at 10:32 am to provider [facility] , who verbally acknowledged these results.  Calcium score 05/07/21 Coronary calcium score of 27.7. This was 42nd percentile for age-, race-, and  sex-matched controls.  Monitor 09/21/20 14 days of data recorded on Zio monitor. Patient had a min HR of 45 bpm, max HR of 184 bpm, and avg HR of 68 bpm. Predominant underlying rhythm was Sinus Rhythm. No VT, atrial fibrillation, high degree block, or pauses noted. 4 Supraventricular Tachycardia runs occurred, the run with the fastest interval lasting 6 beats with a max rate of 184 bpm, the longest lasting 11 beats with an avg rate of 109 bpm. Isolated atrial and ventricular ectopy was rare (<1%). There were 19 triggered events. These are all sinus rhythm based, 3 with PAC and 1 with PVC.  Ambulatory 24 hour BP monitoring 09/21/20 24 hour ambulatory blood pressure monitor reviewed. Remained hypertensive throughout study. Overall mean 176/79, HR 69. Systolic dipped less than 1% with sleep. Max reading 201/98.    EKG:  EKG is personally reviewed.   03/30/2022 not ordered today 08/16/2021 (ED): Sinus rhythm, rate 72 bpm, Borderline repolarization abnormality, When compared to prior, similar appearance. No STEMI. 06/08/2021 Sinus rhythm with 1st degree AV block, 86 bpm 06/23/20: NSR at 83 bpm with poor r wave progression  Recent Labs: 08/16/2021: Hemoglobin 13.2; Platelets 241 12/16/2021: ALT 20; TSH 2.730 02/04/2022: BUN 14; Creatinine, Ser 0.95; Potassium 3.3; Sodium 140   Recent Lipid Panel    Component Value Date/Time   CHOL 283 (H) 12/16/2021 0957   TRIG 211 (H) 12/16/2021 0957   HDL 57 12/16/2021 0957   CHOLHDL 5.0 (H) 12/16/2021 0957   LDLCALC 186 (H) 12/16/2021 0957    Physical Exam:    VS:  BP (!) 190/100   Pulse 86   Ht _0  (1.575 m)   Wt 168 lb (76.2 kg)   BMI 30.73 kg/m     Wt Readings from Last 3 Encounters:  03/30/22 168 lb (76.2 kg)  02/09/22 160 lb (72.6 kg)  12/14/21 167 lb 9.6 oz (76 kg)    GEN: Well nourished, well developed in no acute distress HEENT: Normal, moist mucous membranes NECK: No JVD CARDIAC: regular rhythm, normal S1 and S2, no rubs or gallops. No  murmur. VASCULAR: Radial and DP pulses 2+ bilaterally. No carotid bruits RESPIRATORY:  Clear to auscultation without rales, wheezing or rhonchi  ABDOMEN: Soft, non-tender, non-distended MUSCULOSKELETAL:  Ambulates independently. R shoulder incision c/d/I, limited range of motion SKIN: Warm and dry, no edema NEUROLOGIC:  Alert and oriented x 3. No focal neuro deficits noted. PSYCHIATRIC:  Normal affect    ASSESSMENT:    1. Primary hypertension   2. Mixed hyperlipidemia   3. Coronary artery calcification seen on CT scan   4. Cardiac risk counseling   5. Medication intolerance      PLAN:    Hypertension:  -continues to be above goal -has tolerated only  HCTZ recently -we discussed that the inflammation in her shoulder needs to be treated to prevent complications. It is expected that NSAIDs may raise her blood pressure, but if these are needed, then we need to try an additional medication for control -discussed hydralazine and clonidine today -she believes that one of the early medications only caused lightheadedness, and this may have been due to a pinched nerve and not the medication. She is willing to retrial. On note review, based on the timeline she described, this may have been amlodipine -will start amlodipine 5 mg daily (was on 10 mg in the past). She will contact us if she does not tolerate this -unfortunately, we really have limited options. If she does not tolerate amlodipine, would refer her to Dr. Blenda [facility] advanced hypertension clinic for further evaluation  Intolerances: Spironolactone: rash beta blockers: felt terrible (remotely) Amlodipine: lightheadedness Minoxidil: face/tongue swelling Lisinopril: lightheadedness, stomach pain Valsartan: lightheadedness, nausea, stomach pain  -lab workup for secondary causes summarized in HPI  Mixed hyperlipidemia Coronary calcification -She has not tolerated statins. Discussed zetia, she does not want to try any medications.  We did discuss long term risk of heart attack and stroke and how we use medications to optimize this. She feels she is at her limit and cannot do anything more in terms of medications right now.  -calcium score 05/07/21 showed Ca score=27.7  Cardiac risk counseling and prevention recommendations: -recommend heart healthy/Mediterranean diet, with whole grains, fruits, vegetable, fish, lean meats, nuts, and olive oil. Limit salt. -recommend moderate walking, 3-5 times/week for 30-50 minutes each session. Aim for at least 150 minutes.week. Goal should be pace of 3 miles/hours, or walking 1.5 miles in 30 minutes -recommend avoidance of tobacco products. Avoid excess alcohol.  Plan for follow up: I will see her back in 6-8 weeks or sooner as needed.  Buford Dresser, MD, PhD, [facility]    Medication Adjustments/Labs and Tests Ordered: Current medicines are reviewed at length with the patient today.  Concerns regarding medicines are outlined above.   No orders of the defined types were placed in this encounter.  Meds ordered this encounter  Medications   amLODipine (NORVASC) 5 MG tablet    Sig: Take 1 tablet (5 mg total) by mouth daily.    Dispense:  30 tablet    Refill:  3   Patient Instructions  Medication Instructions:  START: Amlodipine 5 mg daily  *If you need a refill on your cardiac medications before your next appointment, please call your pharmacy*   Lab Work: None ordered today   Testing/Procedures: None ordered today   Follow-Up: At [facility], you and your health needs are our priority.  As part of our continuing mission to provide you with exceptional heart care, we have created designated Provider Care Teams.  These Care Teams include your primary Cardiologist (physician) and Advanced Practice Providers (APPs -  Physician Assistants and Nurse Practitioners) who all work together to provide you with the care you need, when you need  it.  We recommend signing up for the patient portal called "MyChart".  Sign up information is provided on this After Visit Summary.  MyChart is used to connect with patients for Virtual Visits (Telemedicine).  Patients are able to view lab/test results, encounter notes, upcoming appointments, etc.  Non-urgent messages can be sent to your provider as well.   To learn more about what you can do with MyChart, go to [email or web address].    Your next appointment:  6 -8 week(s)  The format for your next appointment:   In Person  Provider:   Buford Dresser, MD{           I,Tinashe Williams,acting as a scribe for Buford Dresser, MD.,have documented all relevant documentation on the behalf of Buford Dresser, MD,as directed by  Buford Dresser, MD while in the presence of Buford Dresser, MD.   I, Buford Dresser, MD, have reviewed all documentation for this visit. The documentation on 03/30/22 for the exam, diagnosis, procedures, and orders are all accurate and complete.

## 2022-03-30 NOTE — Patient Instructions (Addendum)
Medication Instructions:  START: Amlodipine 5 mg daily  *If you need a refill on your cardiac medications before your next appointment, please call your pharmacy*   Lab Work: None ordered today   Testing/Procedures: None ordered today   Follow-Up: At [facility], you and your health needs are our priority.  As part of our continuing mission to provide you with exceptional heart care, we have created designated Provider Care Teams.  These Care Teams include your primary Cardiologist (physician) and Advanced Practice Providers (APPs -  Physician Assistants and Nurse Practitioners) who all work together to provide you with the care you need, when you need it.  We recommend signing up for the patient portal called "MyChart".  Sign up information is provided on this After Visit Summary.  MyChart is used to connect with patients for Virtual Visits (Telemedicine).  Patients are able to view lab/test results, encounter notes, upcoming appointments, etc.  Non-urgent messages can be sent to your provider as well.   To learn more about what you can do with MyChart, go to [email or web address].    Your next appointment:   6 -8 week(s)  The format for your next appointment:   In Person  Provider:   Jodelle Red, MD{

## 2022-03-31 DIAGNOSIS — S46011D Strain of muscle(s) and tendon(s) of the rotator cuff of right shoulder, subsequent encounter: Secondary | ICD-10-CM | POA: Diagnosis not present

## 2022-04-06 DIAGNOSIS — S46011D Strain of muscle(s) and tendon(s) of the rotator cuff of right shoulder, subsequent encounter: Secondary | ICD-10-CM | POA: Diagnosis not present

## 2022-04-11 DIAGNOSIS — S46011D Strain of muscle(s) and tendon(s) of the rotator cuff of right shoulder, subsequent encounter: Secondary | ICD-10-CM | POA: Diagnosis not present

## 2022-04-18 DIAGNOSIS — S46011D Strain of muscle(s) and tendon(s) of the rotator cuff of right shoulder, subsequent encounter: Secondary | ICD-10-CM | POA: Diagnosis not present

## 2022-04-25 DIAGNOSIS — S46011D Strain of muscle(s) and tendon(s) of the rotator cuff of right shoulder, subsequent encounter: Secondary | ICD-10-CM | POA: Diagnosis not present

## 2022-04-27 ENCOUNTER — Ambulatory Visit (HOSPITAL_BASED_OUTPATIENT_CLINIC_OR_DEPARTMENT_OTHER): Payer: Medicare Other | Admitting: Cardiology

## 2022-05-02 DIAGNOSIS — S46011D Strain of muscle(s) and tendon(s) of the rotator cuff of right shoulder, subsequent encounter: Secondary | ICD-10-CM | POA: Diagnosis not present

## 2022-05-04 DIAGNOSIS — S46011D Strain of muscle(s) and tendon(s) of the rotator cuff of right shoulder, subsequent encounter: Secondary | ICD-10-CM | POA: Diagnosis not present

## 2022-05-05 DIAGNOSIS — H04123 Dry eye syndrome of bilateral lacrimal glands: Secondary | ICD-10-CM | POA: Diagnosis not present

## 2022-05-05 DIAGNOSIS — H47021 Hemorrhage in optic nerve sheath, right eye: Secondary | ICD-10-CM | POA: Diagnosis not present

## 2022-05-10 DIAGNOSIS — S46011D Strain of muscle(s) and tendon(s) of the rotator cuff of right shoulder, subsequent encounter: Secondary | ICD-10-CM | POA: Diagnosis not present

## 2022-05-11 ENCOUNTER — Other Ambulatory Visit (HOSPITAL_BASED_OUTPATIENT_CLINIC_OR_DEPARTMENT_OTHER): Payer: Self-pay | Admitting: Cardiology

## 2022-05-13 DIAGNOSIS — S46011D Strain of muscle(s) and tendon(s) of the rotator cuff of right shoulder, subsequent encounter: Secondary | ICD-10-CM | POA: Diagnosis not present

## 2022-05-16 DIAGNOSIS — S46011D Strain of muscle(s) and tendon(s) of the rotator cuff of right shoulder, subsequent encounter: Secondary | ICD-10-CM | POA: Diagnosis not present

## 2022-05-19 DIAGNOSIS — S46011D Strain of muscle(s) and tendon(s) of the rotator cuff of right shoulder, subsequent encounter: Secondary | ICD-10-CM | POA: Diagnosis not present

## 2022-05-23 DIAGNOSIS — S46011D Strain of muscle(s) and tendon(s) of the rotator cuff of right shoulder, subsequent encounter: Secondary | ICD-10-CM | POA: Diagnosis not present

## 2022-05-25 DIAGNOSIS — H524 Presbyopia: Secondary | ICD-10-CM | POA: Diagnosis not present

## 2022-05-26 DIAGNOSIS — S46011D Strain of muscle(s) and tendon(s) of the rotator cuff of right shoulder, subsequent encounter: Secondary | ICD-10-CM | POA: Diagnosis not present

## 2022-05-27 ENCOUNTER — Ambulatory Visit (HOSPITAL_BASED_OUTPATIENT_CLINIC_OR_DEPARTMENT_OTHER): Payer: Medicare Other | Admitting: Cardiology

## 2022-05-27 ENCOUNTER — Encounter (HOSPITAL_BASED_OUTPATIENT_CLINIC_OR_DEPARTMENT_OTHER): Payer: Self-pay | Admitting: Cardiology

## 2022-05-27 VITALS — BP 210/100 | HR 69 | Ht 62.0 in | Wt 165.8 lb

## 2022-05-27 DIAGNOSIS — I2510 Atherosclerotic heart disease of native coronary artery without angina pectoris: Secondary | ICD-10-CM | POA: Diagnosis not present

## 2022-05-27 DIAGNOSIS — Z789 Other specified health status: Secondary | ICD-10-CM | POA: Diagnosis not present

## 2022-05-27 DIAGNOSIS — I10 Essential (primary) hypertension: Secondary | ICD-10-CM | POA: Diagnosis not present

## 2022-05-27 DIAGNOSIS — I1A0 Resistant hypertension: Secondary | ICD-10-CM

## 2022-05-27 DIAGNOSIS — E782 Mixed hyperlipidemia: Secondary | ICD-10-CM

## 2022-05-27 MED ORDER — HYDRALAZINE HCL 10 MG PO TABS: 10.0000 mg | ORAL_TABLET | Freq: Four times a day (QID) | ORAL | 3 refills | Status: DC | PRN

## 2022-05-27 NOTE — Progress Notes (Signed)
Cardiology Office Note:    Date:  05/27/2022   ID:  Regina Marsh, DOB 07-05-1946, MRN [id number]  PCP:  Glenis Smoker, MD  Cardiologist:  Buford Dresser, MD  Referring MD: Glenis Smoker, *   CC: follow up  History of Present Illness:    Regina Marsh is a 76 y.o. female with a hx of Sjogren's, hypertension, hyperlipidemia, and GERD, here for follow up. 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  Cardiac history:  metanephrines that are within normal limits (metanephrine 48, normetanephrine 141). ER note from 03/27/20. Noted to have elevated blood pressure after she had nasal packing for a nosebleed. No associated symptoms. BP noted as 214/91 at that time. Long history of drug intolerances, see summary below.  Today: Range at home has been 597-416 systolic BP. Asking if she might have long Covid as a cause for her labile BP. Still has no sense of smell. Wondering if her intolerances to medications may also be related. She does have stress due to dealing with her husband's cancer, but stress does not seem to correlate with her blood pressure spikes. She is still recovering from shoulder surgery, working with PT, but BP spikes do not seem to follow with pain either.  She stopped amlodipine (was a retrial) due to lightheadedness. Currently only on HCTZ.   We discussed referral to Dr. Oval Linsey and renal dopplers today.   ROS positive for focal (circular) area on the top of her head that causes intermittent strong/sharp pain, more external than internal. No clear triggers. About the size of a quarter.  Denies shortness of breath at rest or with normal exertion. No PND, orthopnea, LE edema or unexpected weight gain. No syncope or palpitations. Rare chest discomfort, discussed previously.  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 Swallow whole.   cholecalciferol (VITAMIN D) 25 MCG (1000 UNIT) tablet Take 2,000 Units by mouth daily. Take 2 tablets daily   D-MANNOSE PO Take 50 mg by mouth daily.   diphenhydrAMINE (BENADRYL) 25 MG tablet Take 0.5 tablets by mouth daily as needed (allergies).   famotidine (PEPCID) 20 MG tablet Take 1 tablet (20 mg total) by mouth 2 (two) times daily.   Garlic (GARLIQUE PO) Take 1 tablet by mouth daily.   hydrochlorothiazide (HYDRODIURIL) 25 MG tablet TAKE ONE TABLET BY MOUTH DAILY   L-THEANINE PO Take 1  tablet by mouth daily as needed (pain).   Lactase (LACTAID PO) Take 1-3 tablets by mouth 3 (three) times daily as needed (when consuming milk products).   Lactobacillus (PROBIOTIC ACIDOPHILUS PO) Take 1 tablet by mouth daily.   Melatonin 500 MCG TBDP Take 500 mcg by mouth daily as needed (sleep).   Menatetrenone (VITAMIN K2) 100 MCG TABS Take 100 mcg by mouth daily.   OVER THE COUNTER MEDICATION Take 1,500 mg by mouth daily. Beet root   pantoprazole (PROTONIX) 40 MG tablet Take 1 tablet (40 mg total) by mouth daily. (Patient taking differently: Take 40 mg by  mouth daily as needed (acid reflux/heartburn).)   Propylene Glycol (SYSTANE BALANCE OP) Place 1 drop into both eyes 3 (three) times daily as needed (dry eyes).   Simethicone (GAS-X PO) Take 1 tablet by mouth daily as needed (flatulance).   Turmeric Curcumin 500 MG CAPS Take 500 mg by mouth daily in the afternoon.   amLODipine (NORVASC) 5 MG tablet Take 1 tablet (5 mg total) by mouth daily. (Patient not taking: Reported on 05/27/2022)   No current facility-administered medications on file prior to visit.     Allergies:   Aleve [naproxen], Allegra [fexofenadine], Amlodipine, Cetirizine hcl, Chlorthalidone, Lactose intolerance (gi), Lisinopril, Loratadine, Pravastatin, Simvastatin, Sulfa antibiotics, Valsartan, African mango [irvingia gabonensis], Penicillins, and Spironolactone   Social History   Tobacco Use   Smoking status: Never   Smokeless tobacco: Never  Vaping Use   Vaping Use: Never used  Substance Use Topics   Alcohol use: Yes    Comment: rare   Drug use: Never    Family History: No family history of premature CAD  ROS:   Please see the history of present illness.   Additional pertinent ROS otherwise unremarkable.   EKGs/Labs/Other Studies Reviewed:    The following studies were reviewed today:  Echo 02/04/2022:  IMPRESSIONS   1. Left ventricular ejection fraction, by estimation, is 60 to 65%. The  left ventricle has normal function. The left ventricle has no regional  wall motion abnormalities. There is mild concentric left ventricular  hypertrophy. Left ventricular diastolic  parameters are consistent with Grade I diastolic dysfunction (impaired  relaxation). Elevated left ventricular end-diastolic pressure.   2. Right ventricular systolic function is normal. The right ventricular  size is normal. There is normal pulmonary artery systolic pressure.   3. Left atrial size was mildly dilated.   4. The mitral valve is normal in structure. Trivial mitral valve   regurgitation. No evidence of mitral stenosis.   5. The aortic valve is tricuspid. Aortic valve regurgitation is not  visualized. No aortic stenosis is present.   6. The inferior vena cava is normal in size with greater than 50%  respiratory variability, suggesting right atrial pressure of 3 mmHg.   FINDINGS   Left Ventricle: Left ventricular ejection fraction, by estimation, is 60  to 65%. The left ventricle has normal function. The left ventricle has no  regional wall motion abnormalities. The left ventricular internal cavity  size was normal in size. There is   mild concentric left ventricular hypertrophy. Left ventricular diastolic  parameters are consistent with Grade I diastolic dysfunction (impaired  relaxation). Elevated left ventricular end-diastolic pressure.   Right Ventricle: The right ventricular size is normal. No increase in  right ventricular wall thickness. Right ventricular systolic function is  normal. There is normal pulmonary artery systolic pressure. The tricuspid  regurgitant velocity is 2.16 m/s, and   with an assumed right atrial  pressure of 3 mmHg, the estimated right  ventricular systolic pressure is 05.6 mmHg.   Left Atrium: Left atrial size was mildly dilated.   Right Atrium: Right atrial size was normal in size.   Pericardium: There is no evidence of pericardial effusion.   Mitral Valve: The mitral valve is normal in structure. Trivial mitral  valve regurgitation. No evidence of mitral valve stenosis.   Tricuspid Valve: The tricuspid valve is normal in structure. Tricuspid  valve regurgitation is trivial. No evidence of tricuspid stenosis.   Aortic Valve: The aortic valve is tricuspid. Aortic valve regurgitation is  not visualized. No aortic stenosis is present.   Pulmonic Valve: The pulmonic valve was normal in structure. Pulmonic valve  regurgitation is trivial. No evidence of pulmonic stenosis.   Aorta: The aortic root is normal in size and  structure.   Venous: The inferior vena cava is normal in size with greater than 50%  respiratory variability, suggesting right atrial pressure of 3 mmHg.   IAS/Shunts: No atrial level shunt detected by color flow Doppler.   CTA Head/Neck 08/16/2021: FINDINGS: CT HEAD   Brain: There is no acute intracranial hemorrhage, mass effect, or edema. Gray-white differentiation is preserved. There is no extra-axial fluid collection. Ventricles and sulci are within normal limits in size configuration. Patchy low-density in the supratentorial white matter is nonspecific but may reflect chronic microvascular ischemic changes. 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   Vascular: There is atherosclerotic calcification at the skull base.   Skull: Calvarium is unremarkable.   Sinuses/Orbits: No acute finding.   Other: None.   Review of the MIP images confirms the above findings   CTA NECK   Aortic arch: Great vessel origins are patent.   Right carotid system: Patent. Minimal calcified plaque along the proximal internal carotid without stenosis.   Left carotid system: Patent. Eccentric noncalcified plaque along the common carotid with less than 50% stenosis. Noncalcified plaque along the proximal internal carotid with less than 50% stenosis.   Vertebral arteries: Patent and codominant.  No stenosis.   Skeleton: Degenerative changes of the cervical spine, greatest at C5-C6.   Other neck: No acute abnormality. Fatty atrophy or resected left submandibular gland.   Upper chest: No apical lung mass.   Review of the MIP images confirms the above findings   CTA HEAD   Suboptimal arterial evaluation due to venous enhancement.   Anterior circulation: Intracranial internal carotid arteries are patent. There is noncalcified plaque along the petrous portion and inferior genu with mild stenosis. Calcified plaque along the cavernous  segment with mild stenosis.   Occlusion of the right M1 MCA. There is reconstitution at the bifurcation. Left middle cerebral artery is patent with what appears to be an early bifurcation probable superimposed stenosis.   Anterior cerebral arteries are patent. Anterior communicating artery is present. Suspect stenosis of the proximal left A1 ACA.   Posterior circulation: Intracranial vertebral arteries are patent. Moderate stenosis on the right. Moderate to marked stenosis on the left just beyond PICA origin. There is likely stenosis at the left PICA origin. Basilar artery is patent with noncalcified plaque. Posterior cerebral arteries are poorly visualized and likely diffusely stenotic and irregular.   Venous sinuses: Patent as allowed by contrast bolus timing.   Review of the MIP images confirms the above findings   IMPRESSION: There is no acute intracranial hemorrhage or evidence of acute infarction. ASPECT score is 10.  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   Intracranial atherosclerosis. Moderate stenosis right vertebral artery and moderate to marked stenosis left vertebral artery just beyond PICA origin. There is stenosis at the left PICA origin. The posterior cerebral arteries are poorly visualized and likely diffusely irregular and stenotic. Probable early left MCA bifurcation with superimposed stenosis. Stenosis of proximal left A1 ACA.   No hemodynamically significant stenosis in the neck. Noncalcified plaque at the left ICA origin with less than 50% stenosis.   Initial results were called by telephone at the time of interpretation on 08/16/2021 at 10:32 am to provider [facility] , who verbally acknowledged these results.  Calcium score 05/07/21 Coronary calcium score of 27.7. This was 42nd percentile for age-, race-, and sex-matched controls.  Monitor 09/21/20 14 days of data recorded on Zio monitor.  Patient had a min HR of 45 bpm, max HR of 184 bpm, and avg HR of 68 bpm. Predominant underlying rhythm was Sinus Rhythm. No VT, atrial fibrillation, high degree block, or pauses noted. 4 Supraventricular Tachycardia runs occurred, the run with the fastest interval lasting 6 beats with a max rate of 184 bpm, the longest lasting 11 beats with an avg rate of 109 bpm. Isolated atrial and ventricular ectopy was rare (<1%). There were 19 triggered events. These are all sinus rhythm based, 3 with PAC and 1 with PVC.  Ambulatory 24 hour BP monitoring 09/21/20 24 hour ambulatory blood pressure monitor reviewed. Remained hypertensive throughout study. Overall mean 176/79, HR 69. Systolic dipped less than 1% with sleep. Max reading 201/98.    EKG:  EKG is personally reviewed.   05/27/22 not ordered today 08/16/2021 (ED): Sinus rhythm, rate 72 bpm, Borderline repolarization abnormality, When compared to prior, similar appearance. No STEMI. 06/08/2021 Sinus rhythm with 1st degree AV block, 86 bpm 06/23/20: NSR at 83 bpm with poor r wave progression  Recent Labs: 08/16/2021: Hemoglobin 13.2; Platelets 241 12/16/2021: ALT 20; TSH 2.730 02/04/2022: BUN 14; Creatinine, Ser 0.95; Potassium 3.3; Sodium 140   Recent Lipid Panel    Component Value Date/Time   CHOL 283 (H) 12/16/2021 0957   TRIG 211 (H) 12/16/2021 0957   HDL 57 12/16/2021 0957   CHOLHDL 5.0 (H) 12/16/2021 0957   LDLCALC 186 (H) 12/16/2021 0957    Physical Exam:    VS:  BP (!) 210/100   Pulse 69   Ht '5\' 2"'  (1.575 m)   Wt 165 lb 12.8 oz (75.2 kg)   SpO2 97%   BMI 30.33 kg/m     Wt Readings from Last 3 Encounters:  05/27/22 165 lb 12.8 oz (75.2 kg)  03/30/22 168 lb (76.2 kg)  02/09/22 160 lb (72.6 kg)    GEN: Well nourished, well developed in no acute distress HEENT: Normal, moist mucous membranes NECK: No JVD CARDIAC: regular rhythm, normal S1 and S2, no rubs or gallops. No murmur. VASCULAR: Radial and DP pulses 2+ bilaterally. No  carotid bruits RESPIRATORY:  Clear to auscultation without rales, wheezing or rhonchi  ABDOMEN: Soft, non-tender, non-distended MUSCULOSKELETAL:  Ambulates independently SKIN: Warm and dry, no edema NEUROLOGIC:  Alert and oriented x 3. No focal neuro deficits noted. PSYCHIATRIC:  Normal affect    ASSESSMENT:    1. Primary hypertension   2. Mixed hyperlipidemia   3. Coronary artery calcification seen on CT scan   4. Medication intolerance   5. Resistant hypertension    PLAN:    Hypertension, labile -continues to be  above goal -has tolerated only HCTZ recently -discussed hydralazine and clonidine today. She has declined previously but is amenable to using a PRN rescue approach. Will prescribe hydralazine 10 mg q6 hours PRN for systolic BP >701 -will refer her to Dr. Blenda Mounts advanced hypertension clinic for further evaluation -declines hydralazine administration in office today  Intolerances: Spironolactone: rash beta blockers: felt terrible (remotely) Amlodipine: lightheadedness Minoxidil: face/tongue swelling Lisinopril: lightheadedness, stomach pain Valsartan: lightheadedness, nausea, stomach pain  -lab workup for secondary causes summarized in HPI. Renal dopplers ordered today.  Mixed hyperlipidemia Coronary calcification -She has not tolerated statins. Discussed non statin alternatives, she does not want to try any medications. We did discuss long term risk of heart attack and stroke and how we use medications to optimize this. She feels she is at her limit and cannot do anything more in terms of medications right now.  -calcium score 05/07/21 showed Ca score=27.7  Cardiac risk counseling and prevention recommendations: -we have discussed diet and exercise recommendations -recommend avoidance of tobacco products. Avoid excess alcohol.  Plan for follow up: I will see her back in 3 mos or sooner as needed. Anticipate visit with advanced hypertension clinic in the  interim  Total time of encounter: 40 minutes total time of encounter, including 36 minutes spent in face-to-face patient care. This time includes coordination of care and counseling regarding above conditions. Remainder of non-face-to-face time involved reviewing chart documents/testing relevant to the patient encounter and documentation in the medical record.  Buford Dresser, MD, PhD, [facility]    Medication Adjustments/Labs and Tests Ordered: Current medicines are reviewed at length with the patient today.  Concerns regarding medicines are outlined above.   Orders Placed This Encounter  Procedures   VAS US RENAL ARTERY DUPLEX   Meds ordered this encounter  Medications   hydrALAZINE (APRESOLINE) 10 MG tablet    Sig: Take 1 tablet (10 mg total) by mouth every 6 (six) hours as needed (systolic blood pressure >779).    Dispense:  90 tablet    Refill:  3   Patient Instructions  Medication Instructions:  Take Hydralazine 1 tablet (10 mg total) by mouth every 6 (six) hours as needed (systolic blood pressure >390)    *If you need a refill on your cardiac medications before your next appointment, please call your pharmacy*   Lab Work: None ordered today   Testing/Procedures: Your physician has requested that you have a renal artery duplex. During this test, an ultrasound is used to evaluate blood flow to the kidneys. Allow one hour for this exam. Do not eat after midnight the day before and avoid carbonated beverages. Take your medications as you usually do.  [address]  Follow-Up: At [facility], you and your health needs are our priority.  As part of our continuing mission to provide you with exceptional heart care, we have created designated Provider Care Teams.  These Care Teams include your primary Cardiologist (physician) and Advanced Practice Providers (APPs -  Physician Assistants and Nurse Practitioners) who all work  together to provide you with the care you need, when you need it.  We recommend signing up for the patient portal called "MyChart".  Sign up information is provided on this After Visit Summary.  MyChart is used to connect with patients for Virtual Visits (Telemedicine).  Patients are able to view lab/test results, encounter notes, upcoming appointments, etc.  Non-urgent messages can be sent to your provider as well.  To learn more about what you can do with MyChart, go to [email or web address].    Your next appointment:   3 month(s)  The format for your next appointment:   In Person  Provider:   Buford Dresser, MD{  Your physician recommends that you schedule a follow-up appointment with Dr. Oval Linsey hypertension clinic

## 2022-05-27 NOTE — Patient Instructions (Signed)
Medication Instructions:  Take Hydralazine 1 tablet (10 mg total) by mouth every 6 (six) hours as needed (systolic blood pressure >160)    *If you need a refill on your cardiac medications before your next appointment, please call your pharmacy*   Lab Work: None ordered today   Testing/Procedures: Your physician has requested that you have a renal artery duplex. During this test, an ultrasound is used to evaluate blood flow to the kidneys. Allow one hour for this exam. Do not eat after midnight the day before and avoid carbonated beverages. Take your medications as you usually do.  [address]  Follow-Up: At [facility], you and your health needs are our priority.  As part of our continuing mission to provide you with exceptional heart care, we have created designated Provider Care Teams.  These Care Teams include your primary Cardiologist (physician) and Advanced Practice Providers (APPs -  Physician Assistants and Nurse Practitioners) who all work together to provide you with the care you need, when you need it.  We recommend signing up for the patient portal called "MyChart".  Sign up information is provided on this After Visit Summary.  MyChart is used to connect with patients for Virtual Visits (Telemedicine).  Patients are able to view lab/test results, encounter notes, upcoming appointments, etc.  Non-urgent messages can be sent to your provider as well.   To learn more about what you can do with MyChart, go to [email or web address].    Your next appointment:   3 month(s)  The format for your next appointment:   In Person  Provider:   Jodelle Red, MD{  Your physician recommends that you schedule a follow-up appointment with Dr. Duke Salvia hypertension clinic

## 2022-06-17 ENCOUNTER — Ambulatory Visit (HOSPITAL_COMMUNITY)
Admission: RE | Admit: 2022-06-17 | Discharge: 2022-06-17 | Disposition: A | Discharge status: Home / Self Care | Payer: Medicare Other | Source: Ambulatory Visit | Attending: Cardiovascular Disease | Admitting: Cardiovascular Disease

## 2022-06-17 DIAGNOSIS — I10 Essential (primary) hypertension: Secondary | ICD-10-CM | POA: Insufficient documentation

## 2022-06-23 ENCOUNTER — Ambulatory Visit: Payer: Medicare Other | Admitting: "Endocrinology

## 2022-06-27 ENCOUNTER — Ambulatory Visit (HOSPITAL_BASED_OUTPATIENT_CLINIC_OR_DEPARTMENT_OTHER): Payer: Medicare Other | Admitting: Cardiovascular Disease

## 2022-06-27 ENCOUNTER — Encounter (HOSPITAL_BASED_OUTPATIENT_CLINIC_OR_DEPARTMENT_OTHER): Payer: Self-pay | Admitting: Cardiovascular Disease

## 2022-06-27 DIAGNOSIS — I10 Essential (primary) hypertension: Secondary | ICD-10-CM

## 2022-06-27 NOTE — Patient Instructions (Addendum)
.Medication Instructions:  Your physician recommends that you continue on your current medications as directed. Please refer to the Current Medication list given to you today.    Labwork: RENIN/ALDOSTERONE LEVELS FIRST THING IN AM SOON    Testing/Procedures: NOON   Follow-Up: 08/31/2022 8:15 AM WITH DR [place]   Special Instructions:   CHECK YOUR BLOOD PRESSURE WHEN YOU GET HOME. IF IT IS STILL ELEVATED TAKE THE AS NEEDED HYDRALAZINE   MONITOR AND LOG YOUR BLOOD PRESSURE AT HOME, MAKE NOTES AS TO WHETHER YOU ARE HAVING INCREASED STRESS/PAIN   DISCUSS YOUR ANXIETY WITH DR Lindell Noe AT YOUR UPCOMING APPOINTMENT    DASH Eating Plan DASH stands for "Dietary Approaches to Stop Hypertension." The DASH eating plan is a healthy eating plan that has been shown to reduce high blood pressure (hypertension). It may also reduce your risk for type 2 diabetes, heart disease, and stroke. The DASH eating plan may also help with weight loss. What are tips for following this plan?  General guidelines Avoid eating more than 2,300 mg (milligrams) of salt (sodium) a day. If you have hypertension, you may need to reduce your sodium intake to 1,500 mg a day. Limit alcohol intake to no more than 1 drink a day for nonpregnant women and 2 drinks a day for men. One drink equals 12 oz of beer, 5 oz of wine, or 1 oz of hard liquor. Work with your health care provider to maintain a healthy body weight or to lose weight. Ask what an ideal weight is for you. Get at least 30 minutes of exercise that causes your heart to beat faster (aerobic exercise) most days of the week. Activities may include walking, swimming, or biking. Work with your health care provider or diet and nutrition specialist (dietitian) to adjust your eating plan to your individual calorie needs. Reading food labels  Check food labels for the amount of sodium per serving. Choose foods with less than 5 percent of the Daily Value of sodium.  Generally, foods with less than 300 mg of sodium per serving fit into this eating plan. To find whole grains, look for the word "whole" as the first word in the ingredient list. Shopping Buy products labeled as "low-sodium" or "no salt added." Buy fresh foods. Avoid canned foods and premade or frozen meals. Cooking Avoid adding salt when cooking. Use salt-free seasonings or herbs instead of table salt or sea salt. Check with your health care provider or pharmacist before using salt substitutes. Do not fry foods. Cook foods using healthy methods such as baking, boiling, grilling, and broiling instead. Cook with heart-healthy oils, such as olive, canola, soybean, or sunflower oil. Meal planning Eat a balanced diet that includes: 5 or more servings of fruits and vegetables each day. At each meal, try to fill half of your plate with fruits and vegetables. Up to 6-8 servings of whole grains each day. Less than 6 oz of lean meat, poultry, or fish each day. A 3-oz serving of meat is about the same size as a deck of cards. One egg equals 1 oz. 2 servings of low-fat dairy each day. A serving of nuts, seeds, or beans 5 times each week. Heart-healthy fats. Healthy fats called Omega-3 fatty acids are found in foods such as flaxseeds and coldwater fish, like sardines, salmon, and mackerel. Limit how much you eat of the following: Canned or prepackaged foods. Food that is high in trans fat, such as fried foods. Food that is high in saturated fat,  such as fatty meat. Sweets, desserts, sugary drinks, and other foods with added sugar. Full-fat dairy products. Do not salt foods before eating. Try to eat at least 2 vegetarian meals each week. Eat more home-cooked food and less restaurant, buffet, and fast food. When eating at a restaurant, ask that your food be prepared with less salt or no salt, if possible. What foods are recommended? The items listed may not be a complete list. Talk with your dietitian  about what dietary choices are best for you. Grains Whole-grain or whole-wheat bread. Whole-grain or whole-wheat pasta. Brown rice. Orpah Cobb. Bulgur. Whole-grain and low-sodium cereals. Pita bread. Low-fat, low-sodium crackers. Whole-wheat flour tortillas. Vegetables Fresh or frozen vegetables (raw, steamed, roasted, or grilled). Low-sodium or reduced-sodium tomato and vegetable juice. Low-sodium or reduced-sodium tomato sauce and tomato paste. Low-sodium or reduced-sodium canned vegetables. Fruits All fresh, dried, or frozen fruit. Canned fruit in natural juice (without added sugar). Meat and other protein foods Skinless chicken or [place]. Ground chicken or [place]. Pork with fat trimmed off. Fish and seafood. Egg whites. Dried beans, peas, or lentils. Unsalted nuts, nut butters, and seeds. Unsalted canned beans. Lean cuts of beef with fat trimmed off. Low-sodium, lean deli meat. Dairy Low-fat (1%) or fat-free (skim) milk. Fat-free, low-fat, or reduced-fat cheeses. Nonfat, low-sodium ricotta or cottage cheese. Low-fat or nonfat yogurt. Low-fat, low-sodium cheese. Fats and oils Soft margarine without trans fats. Vegetable oil. Low-fat, reduced-fat, or light mayonnaise and salad dressings (reduced-sodium). Canola, safflower, olive, soybean, and sunflower oils. Avocado. Seasoning and other foods Herbs. Spices. Seasoning mixes without salt. Unsalted popcorn and pretzels. Fat-free sweets. What foods are not recommended? The items listed may not be a complete list. Talk with your dietitian about what dietary choices are best for you. Grains Baked goods made with fat, such as croissants, muffins, or some breads. Dry pasta or rice meal packs. Vegetables Creamed or fried vegetables. Vegetables in a cheese sauce. Regular canned vegetables (not low-sodium or reduced-sodium). Regular canned tomato sauce and paste (not low-sodium or reduced-sodium). Regular tomato and vegetable juice (not low-sodium or  reduced-sodium). Rosita Fire. Olives. Fruits Canned fruit in a light or heavy syrup. Fried fruit. Fruit in cream or butter sauce. Meat and other protein foods Fatty cuts of meat. Ribs. Fried meat. Tomasa Blase. Sausage. Bologna and other processed lunch meats. Salami. Fatback. Hotdogs. Bratwurst. Salted nuts and seeds. Canned beans with added salt. Canned or smoked fish. Whole eggs or egg yolks. Chicken or [place] with skin. Dairy Whole or 2% milk, cream, and half-and-half. Whole or full-fat cream cheese. Whole-fat or sweetened yogurt. Full-fat cheese. Nondairy creamers. Whipped toppings. Processed cheese and cheese spreads. Fats and oils Butter. Stick margarine. Lard. Shortening. Ghee. Bacon fat. Tropical oils, such as coconut, palm kernel, or palm oil. Seasoning and other foods Salted popcorn and pretzels. Onion salt, garlic salt, seasoned salt, table salt, and sea salt. Worcestershire sauce. Tartar sauce. Barbecue sauce. Teriyaki sauce. Soy sauce, including reduced-sodium. Steak sauce. Canned and packaged gravies. Fish sauce. Oyster sauce. Cocktail sauce. Horseradish that you find on the shelf. Ketchup. Mustard. Meat flavorings and tenderizers. Bouillon cubes. Hot sauce and Tabasco sauce. Premade or packaged marinades. Premade or packaged taco seasonings. Relishes. Regular salad dressings. Where to find more information: National Heart, Lung, and Blood Institute: PopSteam.is American Heart Association: www.heart.org Summary The DASH eating plan is a healthy eating plan that has been shown to reduce high blood pressure (hypertension). It may also reduce your risk for type 2 diabetes, heart disease, and stroke.  With the DASH eating plan, you should limit salt (sodium) intake to 2,300 mg a day. If you have hypertension, you may need to reduce your sodium intake to 1,500 mg a day. When on the DASH eating plan, aim to eat more fresh fruits and vegetables, whole grains, lean proteins, low-fat dairy, and  heart-healthy fats. Work with your health care provider or diet and nutrition specialist (dietitian) to adjust your eating plan to your individual calorie needs. This information is not intended to replace advice given to you by your health care provider. Make sure you discuss any questions you have with your health care provider. Document Released: 09/15/2011 Document Revised: 09/08/2017 Document Reviewed: 09/19/2016 Elsevier Patient Education  2020 [employer].

## 2022-06-27 NOTE — Progress Notes (Signed)
Advanced [facility]pertension Clinic Initial Assessment:    Date:  06/28/2022   ID:  Zakya Halabi, DOB 04-23-1946, MRN [id number]  PCP:  Shon Hale, MD  Cardiologist:  Jodelle Red, MD  Nephrologist:  Referring MD: Shon Hale, *   CC: Hypertension  History of Present Illness:    Regina Marsh is a 76 y.o. female with a hx of hypertension, hyperlipidemia, Sjogren's disease, GERD, here to establish care in the [facility] Clinic.   She saw Dr. Cristal Deer on 05/27/2022 and reported blood pressures ranging 119-190 systolic at home. She had stopped amlodipine due to lightheadedness, and was only taking HCTZ. She was prescribed hydralazine 10 mg every 6 hours as needed for systolic BP >160. She was referred to the Advanced Hypertension clinic for further evaluation and management. She had normal catecholamines and metanephrines. Renal dopplers were normal 06/2022. She had an Echo 01/2022 that revealed LVEF 60-65% with mild LVH and grade 1 diastolic dysfunction. She wore a 24 hr blood pressure monitor in 2021 that showed an average blood pressure of 176/79.  Today, the patient states that she has been feeling okay. However, she becomes fatigued easily and generally feels like she has no energy. She has been maintaining her BP by taking her medications, HCTZ. She notes having no history of blood pressure problems until after 2020. Presents chart with blood pressure ranging 108-158, personally reviewed. She reports that HCTZ has helped in keeping her blood pressure low. She notes that her sense of smell and taste is not well. She had COVID infections 10/2018 and 09/2021. 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 Recently had a cervical spine surgery 11/04/2019, which also helped relieve her leg pain.  She also had right reverse shoulder arthroplasty in 11/12/2021. She has not been able to walk as she used  to. For exercise she tries to walk 6000 to 12000 steps a day; she is also in physical therapy for her shoulder which includes resistance training. She cooks at home and watches her salt intake. Not much caffeine intake and no alcohol consumption. She does take supplements, which she presents as a list. Takes tylenol for pain relief intermittently. Denies snoring. She denies any palpitations, chest pain, shortness of breath, or peripheral edema. No lightheadedness, headaches, syncope, orthopnea, or PND.  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 Swallow whole.   Cholecalciferol (VITAMIN D3) 50 MCG (2000 UT)  capsule Take 4,000 Units by mouth daily. Take 2 tablets daily   D-MANNOSE PO Take 50 mg by mouth daily.   diphenhydrAMINE (BENADRYL) 25 MG tablet Take 0.5 tablets by mouth daily as needed (allergies).   famotidine (PEPCID) 20 MG tablet Take 1 tablet (20 mg total) by mouth 2 (two) times daily.   Garlic (GARLIQUE PO) Take 1 tablet by mouth daily.   hydrALAZINE (APRESOLINE) 10 MG tablet Take 1 tablet (10  mg total) by mouth every 6 (six) hours as needed (systolic blood pressure >188).   hydrochlorothiazide (HYDRODIURIL) 25 MG tablet TAKE ONE TABLET BY MOUTH DAILY   L-THEANINE PO Take 1 tablet by mouth daily as needed (pain).   Lactase (LACTAID PO) Take 1-3 tablets by mouth 3 (three) times daily as needed (when consuming milk products).   Lactobacillus (PROBIOTIC ACIDOPHILUS PO) Take 1 tablet by mouth daily.   Melatonin 500 MCG TBDP Take 500 mcg by mouth daily as needed (sleep).   Menatetrenone (VITAMIN K2) 100 MCG TABS Take 100 mcg by mouth daily.   OVER THE COUNTER MEDICATION Take 1,500 mg by mouth daily. Beet root   pantoprazole (PROTONIX) 40 MG tablet Take 1 tablet (40 mg total) by mouth daily. (Patient taking differently: Take 40 mg by mouth daily as needed (acid reflux/heartburn).)   Propylene Glycol (SYSTANE BALANCE OP) Place 1 drop into both eyes 3 (three) times daily as needed (dry eyes).   Simethicone (GAS-X PO) Take 1 tablet by mouth daily as needed (flatulance).   Turmeric Curcumin 500 MG CAPS Take 500 mg by mouth daily in the afternoon.     Allergies:   Aleve [naproxen], Allegra [fexofenadine], Amlodipine, Cetirizine hcl, Chlorthalidone, Lactose intolerance (gi), Lisinopril, Loratadine, Pravastatin, Simvastatin, Sulfa antibiotics, Valsartan, African mango [irvingia gabonensis], Penicillins, and Spironolactone   Social History   Socioeconomic History   Marital status: Married    Spouse name: Not on file   Number of children: 1   Years of education: Not on file   Highest education level: Not on file  Occupational History   Occupation: retired Pharmacist, hospital  Tobacco Use   Smoking status: Never   Smokeless tobacco: Never  Vaping Use   Vaping Use: Never used  Substance and Sexual Activity   Alcohol use: Yes    Comment: rare   Drug use: Never   Sexual activity: Not Currently    Birth control/protection: Post-menopausal  Other Topics Concern   Not on file  Social History  Narrative   Right handed    Lives in a two story home    Social Determinants of Health   Financial Resource Strain: Not on file  Food Insecurity: Not on file  Transportation Needs: Not on file  Physical Activity: Not on file  Stress: Not on file  Social Connections: Not on file     Family History: The patient's family history includes Heart attack in her mother; Hypertension in her mother; Leukemia in her father; Stroke in her father. There is no history of Allergic rhinitis, Angioedema, Asthma, Atopy, Eczema, Immunodeficiency, or Urticaria.  ROS:   Please see the history of present illness.    (+) right shoulder pain and edema (+) anxiety  (+) Fatigue All other systems reviewed and are negative.  EKGs/Labs/Other Studies Reviewed:    Bilateral Renal Artery Doppler  06/17/2022: Summary:  Renal:     Right: Normal size right kidney. Abnormal right Resistive Index.         Normal cortical thickness of right kidney. RRV  flow present.         No evidence of right renal artery stenosis.  Left:  Normal size of left kidney. Abnormal left Resisitve Index.         Normal cortical thickness of the left kidney. LRV flow         present. No evidence of left renal artery stenosis.   Echo  02/04/2022:  1. Left ventricular ejection fraction, by estimation, is 60 to 65%. The  left ventricle has normal function. The left ventricle has no regional  wall motion abnormalities. There is mild concentric left ventricular  hypertrophy. Left ventricular diastolic  parameters are consistent with Grade I diastolic dysfunction (impaired  relaxation). Elevated left ventricular end-diastolic pressure.   2. Right ventricular systolic function is normal. The right ventricular  size is normal. There is normal pulmonary artery systolic pressure.   3. Left atrial size was mildly dilated.   4. The mitral valve is normal in structure. Trivial mitral valve  regurgitation. No evidence of mitral stenosis.   5.  The aortic valve is tricuspid. Aortic valve regurgitation is not  visualized. No aortic stenosis is present.   6. The inferior vena cava is normal in size with greater than 50%  respiratory variability, suggesting right atrial pressure of 3 mmHg.   CTA Head/Neck  08/16/2021: IMPRESSION: There is no acute intracranial hemorrhage or evidence of acute infarction. ASPECT score is 10.   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   Intracranial atherosclerosis. Moderate stenosis right vertebral artery and moderate to marked stenosis left vertebral artery just beyond PICA origin. There is stenosis at the left PICA origin. The posterior cerebral arteries are poorly visualized and likely diffusely irregular and stenotic. Probable early left MCA bifurcation with superimposed stenosis. Stenosis of proximal left A1 ACA.   No hemodynamically significant stenosis in the neck. Noncalcified plaque at the left ICA origin with less than 50% stenosis.  Coronary Calcium Score  05/07/2021: FINDINGS: Coronary arteries: Normal origins.   Coronary Calcium Score:   Left main: 0   Left anterior descending artery: 1.42   Left circumflex artery: 0   Right coronary artery: 26.3   Total: 27.7   Percentile: 42nd   Pericardium: Normal.   Ascending Aorta: Normal caliber.   Non-cardiac: [facility].   IMPRESSION: Coronary calcium score of 27.7. This was 42nd percentile for age-, race-, and sex-matched controls.  Monitor  09/2020: 14 days of data recorded on Zio monitor. Patient had a min HR of 45 bpm, max HR of 184 bpm, and avg HR of 68 bpm. Predominant underlying rhythm was Sinus Rhythm. No VT, atrial fibrillation, high degree block, or pauses noted. 4 Supraventricular Tachycardia runs occurred, the run with the fastest interval lasting 6 beats with a max rate of 184 bpm, the longest lasting 11 beats with an avg rate of  109 bpm. Isolated atrial and ventricular ectopy was rare (<1%). There were 19 triggered events. These are all sinus rhythm based, 3 with PAC and 1 with PVC.  Blood Pressure Monitor  09/07/2020: 24 hour ambulatory blood pressure monitor reviewed. Remained hypertensive throughout study. Overall mean 176/79, HR 69. Systolic dipped less than 1% with sleep. Max reading 201/98.    EKG:  EKG is personally reviewed. 06/27/2022: EKG was not ordered.   Recent Labs: 08/16/2021: Hemoglobin 13.2; Platelets 241 12/16/2021: ALT 20; TSH 2.730 02/04/2022: BUN 14; Creatinine, Ser 0.95; Potassium 3.3; Sodium 140   Recent  Lipid Panel    Component Value Date/Time   CHOL 283 (H) 12/16/2021 0957   TRIG 211 (H) 12/16/2021 0957   HDL 57 12/16/2021 0957   CHOLHDL 5.0 (H) 12/16/2021 0957   LDLCALC 186 (H) 12/16/2021 0957    Physical Exam:    VS:  BP (!) 252/98 (BP Location: Left Arm, Patient Position: Sitting, Cuff Size: Normal)   Pulse 87   Ht  (1.575 m)   Wt 168 lb 12.8 oz (76.6 kg)   SpO2 96%   BMI 30.87 kg/m  , BMI Body mass index is 30.87 kg/m. GENERAL:  Well appearing HEENT: Pupils equal round and reactive, fundi not visualized, oral mucosa unremarkable NECK:  No jugular venous distention, waveform within normal limits, carotid upstroke brisk and symmetric, no bruits, no thyromegaly LUNGS:  Clear to auscultation bilaterally HEART:  RRR.  PMI not displaced or sustained,S1 and S2 within normal limits, no S3, no S4, no clicks, no rubs, no murmurs ABD:  Flat, positive bowel sounds normal in frequency in pitch, no bruits, no rebound, no guarding, no midline pulsatile mass, no hepatomegaly, no splenomegaly EXT:  2 plus pulses throughout, no edema, no cyanosis no clubbing SKIN:  No rashes no nodules NEURO:  Cranial nerves II through XII grossly intact, motor grossly intact throughout PSYCH:  Cognitively intact, oriented to person place and time   ASSESSMENT/PLAN:    Essential hypertension,  benign Blood pressure is still very labile ranging from the 90s-150s/60s-80s.  After further discussion we noted that her blood pressures seem to drop quite precipitously even with adding low doses of additional medications to her HCTZ.  She is struggling with anxiety and also under a lot of stress with her husband's prostate cancer.  She is also been under a lot of pain after her shoulder surgery.  She is going to start tracking what is going on when her blood pressure spikes.  She notes that she does have some whitecoat hypertension, especially in the setting of being at the new doctor.  Her blood pressure was extraordinarily high today in the office but has been much lower at home.  The highest numbers recently have been in the 150s.  Hesitant to start any more medications at this time.  I think she might benefit from treatment of anxiety and better pain control.  She is going to start taking Tylenol before her physical therapy sessions.  She had a session today and is in a lot of pain in her shoulder.  She is also going to consider an SSRI.  Continue HCTZ for now.  She will check her blood pressures when she goes home today to make sure that they are not as high as they were in the office today.  She continues to have hydralazine to take on an as-needed basis.  She has had a very thorough work-up of secondary causes of hypertension.  The only thing outstanding is checking renin and aldosterone levels.  She will come back for this.  Her diet has been very healthy and she is going to increase her exercise as her postsurgical state allows.   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  Limits caffeine.  No EtOH.  No NSAIDs  Renovascular HTN  Not Screened Screened  Sleep Apnea  Not Screened Screened     - Comments   no snoring   Hyperthyroidism Screened       - Comments TSH WNL 12/2020    Thyroid Disease Screened  Screened     - Comments TSH WNL 12/2020    Hyperaldosteronism  Not Screened Screened     - Comments   check renin/aldosterone  Pheochromocytoma Screened  Screened     - Comments plasm (05/2020) and urine (11/2020) metanephrines both WNL    Cushing's Syndrome Screened  Screened     - Comments cortison WNL (11/2020)    Hyperparathyroidism   Screened  Coarctation of the Aorta   Screened     - Comments   BP symmetric  Compliance Screened  Screened    Relevant Labs/Studies:    Latest Ref Rng & Units 02/04/2022    1:49 PM 12/16/2021    9:57 AM 08/16/2021    8:53 AM  Basic Labs  Sodium 135 - 145 mmol/L 140  140  141   Potassium 3.5 - 5.1 mmol/L 3.3  3.4  3.1   Creatinine 0.44 - 1.00 mg/dL 6.26  9.48  5.46        Latest Ref Rng & Units 12/16/2021    9:57 AM 08/16/2021    8:53 AM  Thyroid   TSH 0.450 - 4.500 uIU/mL 2.730  1.302                 06/17/2022    9:24 AM  Renovascular   Renal Artery [name] Completed Yes    Disposition:    FU with Jacquette Canales C. Duke Salvia, MD, [facility] in 2 months.   Medication Adjustments/Labs and Tests Ordered: Current medicines are reviewed at length with the patient today.  Concerns regarding medicines are outlined above.   Orders Placed This Encounter  Procedures   Aldosterone + renin activity w/ ratio   No orders of the defined types were placed in this encounter.  I,Mathew Stumpf,acting as a Neurosurgeon for Chilton Si, MD.,have documented all relevant documentation on the behalf of Chilton Si, MD,as directed by  Chilton Si, MD while in the presence of Chilton Si, MD.  I, Sherlin Sonier C. Duke Salvia, MD have reviewed all documentation for this visit.  The documentation of the exam, diagnosis, procedures, and orders on 06/28/2022 are all accurate and complete.   Signed, Chilton Si, MD  06/28/2022 8:21 AM    [facility]

## 2022-06-27 NOTE — Assessment & Plan Note (Addendum)
Blood pressure is still very labile ranging from the 90s-150s/60s-80s.  After further discussion we noted that her blood pressures seem to drop quite precipitously even with adding low doses of additional medications to her HCTZ.  She is struggling with anxiety and also under a lot of stress with her husband's prostate cancer.  She is also been under a lot of pain after her shoulder surgery.  She is going to start tracking what is going on when her blood pressure spikes.  She notes that she does have some whitecoat hypertension, especially in the setting of being at the new doctor.  Her blood pressure was extraordinarily high today in the office but has been much lower at home.  The highest numbers recently have been in the 150s.  Hesitant to start any more medications at this time.  I think she might benefit from treatment of anxiety and better pain control.  She is going to start taking Tylenol before her physical therapy sessions.  She had a session today and is in a lot of pain in her shoulder.  She is also going to consider an SSRI.  Continue HCTZ for now.  She will check her blood pressures when she goes home today to make sure that they are not as high as they were in the office today.  She continues to have hydralazine to take on an as-needed basis.  She has had a very thorough work-up of secondary causes of hypertension.  The only thing outstanding is checking renin and aldosterone levels.  She will come back for this.  Her diet has been very healthy and she is going to increase her exercise as her postsurgical state allows.

## 2022-06-28 ENCOUNTER — Encounter (HOSPITAL_BASED_OUTPATIENT_CLINIC_OR_DEPARTMENT_OTHER): Payer: Self-pay | Admitting: Cardiovascular Disease

## 2022-06-28 DIAGNOSIS — I10 Essential (primary) hypertension: Secondary | ICD-10-CM | POA: Diagnosis not present

## 2022-07-01 ENCOUNTER — Encounter (HOSPITAL_BASED_OUTPATIENT_CLINIC_OR_DEPARTMENT_OTHER): Payer: Medicare Other

## 2022-07-04 LAB — ALDOSTERONE + RENIN ACTIVITY W/ RATIO
ALDOS/RENIN RATIO: 9.1 (ref 0.0–30.0)
ALDOSTERONE: 17.3 ng/dL (ref 0.0–30.0)
Renin: 1.893 ng/mL/hr (ref 0.167–5.380)

## 2022-07-28 DIAGNOSIS — R5383 Other fatigue: Secondary | ICD-10-CM | POA: Diagnosis not present

## 2022-07-28 DIAGNOSIS — R7301 Impaired fasting glucose: Secondary | ICD-10-CM | POA: Diagnosis not present

## 2022-07-28 DIAGNOSIS — Z23 Encounter for immunization: Secondary | ICD-10-CM | POA: Diagnosis not present

## 2022-07-28 DIAGNOSIS — R0989 Other specified symptoms and signs involving the circulatory and respiratory systems: Secondary | ICD-10-CM | POA: Diagnosis not present

## 2022-08-25 ENCOUNTER — Ambulatory Visit (HOSPITAL_BASED_OUTPATIENT_CLINIC_OR_DEPARTMENT_OTHER): Payer: Medicare Other | Admitting: Cardiology

## 2022-08-25 ENCOUNTER — Encounter (HOSPITAL_BASED_OUTPATIENT_CLINIC_OR_DEPARTMENT_OTHER): Payer: Self-pay | Admitting: Cardiology

## 2022-08-25 VITALS — BP 186/94 | HR 80 | Ht 62.0 in | Wt 167.0 lb

## 2022-08-25 DIAGNOSIS — I2510 Atherosclerotic heart disease of native coronary artery without angina pectoris: Secondary | ICD-10-CM

## 2022-08-25 DIAGNOSIS — I10 Essential (primary) hypertension: Secondary | ICD-10-CM

## 2022-08-25 DIAGNOSIS — R42 Dizziness and giddiness: Secondary | ICD-10-CM

## 2022-08-25 DIAGNOSIS — Z789 Other specified health status: Secondary | ICD-10-CM

## 2022-08-25 DIAGNOSIS — E782 Mixed hyperlipidemia: Secondary | ICD-10-CM | POA: Diagnosis not present

## 2022-08-25 NOTE — Progress Notes (Signed)
Cardiology Office Note:    Date:  08/25/2022   ID:  Regina Marsh, DOB 1946/07/25, MRN [id number]  PCP:  Glenis Smoker, MD  Cardiologist:  Buford Dresser, MD  Referring MD: Glenis Smoker, *   CC: follow up  History of Present Illness:    Regina Marsh is a 76 y.o. female with a hx of Sjogren's, hypertension, hyperlipidemia, and GERD, here for follow up. 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  Cardiac history:  metanephrines that are within normal limits (metanephrine 48, normetanephrine 141). ER note from 03/27/20. Noted to have elevated blood pressure after she had nasal packing for a nosebleed. No associated symptoms. BP noted as 214/91 at that time. Long history of drug intolerances, see summary below.  At her last visit her BP at home ranged 982-641 systolic. 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 Stress or pain did not seem to correlate with her blood pressure spikes. She stopped amlodipine (was a retrial) due to lightheadedness. Was on HCTZ. She complained of a focal (circular) area on the top of her head that causes intermittent strong/sharp pain, more external than internal. No clear triggers. About the size of a quarter. We discussed hydralazine and clonidine. She had declined previously but was amenable to using a PRN rescue approach. Prescribed hydralazine 10 mg q6 hours PRN for systolic BP >583. She was [facility] for further evaluation.  She saw Dr. Oval Linsey in the HTN clinic on 06/27/2022 and her blood pressure was 252/98. She reported at home BP ranging 108-158. It was felt she might benefit from treatment of anxiety and better pain control. She was going to start taking Tylenol before her physical therapy sessions. Labs for renin and aldosterone levels were ordered and within normal limits.   Today,  she reports the other day her blood pressure was initially 094 systolic. She then began to notice "a funny feeling" somewhat similar to lightheadedness. She checked her BP which was 076 systolic. This lasted for a time before improving to 808 systolic later in the day.  If her blood pressure is higher than 811 systolic, she will first try yoga and breathing exercises. This usually helps improve her blood pressure by 30 points, so she doesn't always need to take her prn antihypertensives. Prior to a blood pressure spike she will often feel a sensation similar to a hot flash. On some days her right shoulder pain continues to raise her blood pressure. She has difficulty with reaching across her back.  In clinic today her blood pressure is 198/110 (186/94 on recheck). She also attributes this to receiving her COVID booster vaccination earlier today. She reports a mild skin reaction around the site of her vaccination.  A couple weeks ago she obtained a treadmill. She continues to work on weight loss with 30 minute cardio exercises.   She denies any palpitations, chest pain, shortness of breath, or peripheral edema. No headaches, syncope, orthopnea, or PND.   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 Swallow whole.   Cholecalciferol (VITAMIN D3) 50 MCG (2000 UT) capsule Take 4,000 Units by mouth daily. Take 2 tablets daily   D-MANNOSE PO Take 50 mg by mouth daily.   diphenhydrAMINE (BENADRYL) 25 MG tablet Take 0.5 tablets by mouth daily as needed (allergies).   famotidine (PEPCID) 20 MG tablet Take 1 tablet (20 mg total) by mouth 2 (two) times daily.   Garlic (GARLIQUE PO) Take 1 tablet by mouth daily.   hydrALAZINE (APRESOLINE) 10 MG tablet Take 1 tablet (10 mg total) by mouth every 6 (six) hours as needed (systolic blood pressure >342).   hydrochlorothiazide (HYDRODIURIL) 25 MG tablet TAKE ONE TABLET BY MOUTH DAILY   L-THEANINE PO Take 1 tablet by mouth daily as needed (pain).   Lactase (LACTAID PO) Take 1-3 tablets by mouth 3 (three) times daily as needed (when consuming milk products).   Lactobacillus (PROBIOTIC ACIDOPHILUS PO) Take 1 tablet by mouth daily.   Melatonin 500 MCG TBDP Take 500 mcg by mouth daily as needed (sleep).   Menatetrenone (VITAMIN K2) 100 MCG TABS Take 100 mcg by mouth daily.   OVER THE COUNTER MEDICATION Take 1,500 mg by mouth daily. Beet root   pantoprazole (PROTONIX) 40 MG tablet Take 1 tablet (40 mg total) by mouth daily. (Patient taking differently: Take 40 mg by mouth daily as needed (acid reflux/heartburn).)   Propylene Glycol (SYSTANE BALANCE OP) Place 1 drop into both eyes 3 (three) times daily as needed (dry eyes).   Simethicone (GAS-X PO) Take 1 tablet by mouth daily as needed (flatulance).   Turmeric Curcumin 500 MG CAPS Take 500 mg by mouth daily in the afternoon.   No current facility-administered medications on file prior to visit.     Allergies:   Aleve [naproxen], Allegra [fexofenadine], Amlodipine, Cetirizine hcl, Chlorthalidone, Lactose intolerance (gi), Lisinopril,  Loratadine, Pravastatin, Simvastatin, Sulfa antibiotics, Valsartan, African mango [irvingia gabonensis], Penicillins, and Spironolactone   Social History   Tobacco Use   Smoking status: Never   Smokeless tobacco: Never  Vaping Use   Vaping Use: Never used  Substance Use Topics   Alcohol use: Yes    Comment: rare   Drug use: Never    Family History: No family history of premature CAD  ROS:   Please see the history of present illness.   (+) Sensation similar to lightheadedness (+) Right shoulder pain Additional pertinent ROS otherwise unremarkable.   EKGs/Labs/Other Studies Reviewed:    The following studies were reviewed today:  Bilateral Renal Artery Doppler  06/17/2022: Summary:  Renal:    Right: Normal size right kidney. Abnormal right Resistive Index.         Normal cortical thickness of right kidney. RRV flow present.         No evidence of right renal artery stenosis.  Left:  Normal size of left kidney. Abnormal left Resisitve Index.         Normal cortical thickness of the left kidney. LRV flow         present. No evidence of left renal artery stenosis.   Echo 02/04/2022: IMPRESSIONS   1. Left ventricular ejection fraction, by estimation, is 60 to 65%. The  left ventricle has normal function. The left ventricle has no regional  wall motion abnormalities. There is mild concentric left ventricular  hypertrophy. Left ventricular diastolic  parameters are consistent with Grade I diastolic dysfunction (impaired  relaxation). Elevated left ventricular end-diastolic pressure.   2. Right ventricular systolic function is normal. The right ventricular  size is normal. There is normal pulmonary artery systolic pressure.   3. Left atrial size was mildly dilated.   4. The mitral valve is normal in structure. Trivial mitral valve  regurgitation. No evidence of mitral stenosis.   5. The aortic valve is tricuspid. Aortic valve regurgitation is not  visualized. No aortic  stenosis is present.   6. The inferior vena cava is normal in size with greater than 50%  respiratory variability, suggesting right atrial pressure of 3 mmHg.   FINDINGS   Left Ventricle: Left ventricular ejection fraction, by estimation, is 60  to 65%. The left ventricle has normal function. The left ventricle has no  regional wall motion abnormalities. The left ventricular internal cavity  size was normal in size. There is   mild concentric left ventricular hypertrophy. Left ventricular diastolic  parameters are consistent with Grade I diastolic dysfunction (impaired  relaxation). Elevated left ventricular end-diastolic pressure.   Right Ventricle: The right ventricular size is normal. No increase in  right ventricular wall thickness. Right ventricular systolic function is  normal. There is normal pulmonary artery systolic pressure. The tricuspid  regurgitant velocity is 2.16 m/s, and   with an assumed right atrial pressure of 3 mmHg, the estimated right  ventricular systolic pressure is 79.4 mmHg.   Left Atrium: Left atrial size was mildly dilated.   Right Atrium: Right atrial size was normal in size.   Pericardium: There is no evidence of pericardial effusion.   Mitral Valve: The mitral valve is normal in structure. Trivial mitral  valve regurgitation. No evidence of mitral valve stenosis.   Tricuspid Valve: The tricuspid valve is normal in structure. Tricuspid  valve regurgitation is trivial. No evidence of tricuspid stenosis.   Aortic Valve: The aortic valve is tricuspid. Aortic valve regurgitation is  not visualized. No aortic stenosis is present.   Pulmonic Valve: The pulmonic valve was normal in structure. Pulmonic valve  regurgitation is trivial. No evidence of pulmonic stenosis.   Aorta: The aortic root is normal in size and structure.   Venous: The inferior vena cava is normal in size with greater than 50%  respiratory variability, suggesting right atrial pressure  of 3 mmHg.   IAS/Shunts: No atrial level shunt detected by color flow Doppler.   CTA Head/Neck 08/16/2021: FINDINGS: CT HEAD   Brain: There is no acute intracranial hemorrhage, mass effect, or edema. Gray-white differentiation is preserved. There is no extra-axial fluid collection. Ventricles and sulci are within normal limits in size configuration. Patchy low-density in the supratentorial white matter is nonspecific but may reflect chronic microvascular ischemic changes. 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   Vascular: There is atherosclerotic calcification at the skull base.   Skull: Calvarium is unremarkable.  Sinuses/Orbits: No acute finding.   Other: None.   Review of the MIP images confirms the above findings   CTA NECK   Aortic arch: Great vessel origins are patent.   Right carotid system: Patent. Minimal calcified plaque along the proximal internal carotid without stenosis.   Left carotid system: Patent. Eccentric noncalcified plaque along the common carotid with less than 50% stenosis. Noncalcified plaque along the proximal internal carotid with less than 50% stenosis.   Vertebral arteries: Patent and codominant.  No stenosis.   Skeleton: Degenerative changes of the cervical spine, greatest at C5-C6.   Other neck: No acute abnormality. Fatty atrophy or resected left submandibular gland.   Upper chest: No apical lung mass.   Review of the MIP images confirms the above findings   CTA HEAD   Suboptimal arterial evaluation due to venous enhancement.   Anterior circulation: Intracranial internal carotid arteries are patent. There is noncalcified plaque along the petrous portion and inferior genu with mild stenosis. Calcified plaque along the cavernous segment with mild stenosis.   Occlusion of the right M1 MCA. There is reconstitution at the bifurcation. Left middle cerebral artery is patent with  what appears to be an early bifurcation probable superimposed stenosis.   Anterior cerebral arteries are patent. Anterior communicating artery is present. Suspect stenosis of the proximal left A1 ACA.   Posterior circulation: Intracranial vertebral arteries are patent. Moderate stenosis on the right. Moderate to marked stenosis on the left just beyond PICA origin. There is likely stenosis at the left PICA origin. Basilar artery is patent with noncalcified plaque. Posterior cerebral arteries are poorly visualized and likely diffusely stenotic and irregular.   Venous sinuses: Patent as allowed by contrast bolus timing.   Review of the MIP images confirms the above findings   IMPRESSION: There is no acute intracranial hemorrhage or evidence of acute infarction. ASPECT score is 10.   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   Intracranial atherosclerosis. Moderate stenosis right vertebral artery and moderate to marked stenosis left vertebral artery just beyond PICA origin. There is stenosis at the left PICA origin. The posterior cerebral arteries are poorly visualized and likely diffusely irregular and stenotic. Probable early left MCA bifurcation with superimposed stenosis. Stenosis of proximal left A1 ACA.   No hemodynamically significant stenosis in the neck. Noncalcified plaque at the left ICA origin with less than 50% stenosis.   Initial results were called by telephone at the time of interpretation on 08/16/2021 at 10:32 am to provider [facility] , who verbally acknowledged these results.  Calcium score 05/07/21 Coronary calcium score of 27.7. This was 42nd percentile for age-, race-, and sex-matched controls.  Monitor 09/21/20 14 days of data recorded on Zio monitor. Patient had a min HR of 45 bpm, max HR of 184 bpm, and avg HR of 68 bpm. Predominant underlying rhythm was Sinus Rhythm. No VT, atrial fibrillation,  high degree block, or pauses noted. 4 Supraventricular Tachycardia runs occurred, the run with the fastest interval lasting 6 beats with a max rate of 184 bpm, the longest lasting 11 beats with an avg rate of 109 bpm. Isolated atrial and ventricular ectopy was rare (<1%). There were 19 triggered events. These are all sinus rhythm based, 3 with PAC and 1 with PVC.  Ambulatory 24 hour BP monitoring 09/21/20 24 hour ambulatory blood pressure monitor reviewed. Remained hypertensive throughout study. Overall mean 176/79, HR 69. Systolic dipped less than 1% with sleep. Max reading  201/98.    EKG:  EKG is personally reviewed.   08/25/2022:  not ordered today 02/04/2022 (Dr. Griffin Basil):  NSR at 75 bpm. 08/16/2021 (ED): Sinus rhythm, rate 72 bpm, Borderline repolarization abnormality, When compared to prior, similar appearance. No STEMI. 06/08/2021 Sinus rhythm with 1st degree AV block, 86 bpm 06/23/20: NSR at 83 bpm with poor r wave progression  Recent Labs: 12/16/2021: ALT 20; TSH 2.730 02/04/2022: BUN 14; Creatinine, Ser 0.95; Potassium 3.3; Sodium 140   Recent Lipid Panel    Component Value Date/Time   CHOL 283 (H) 12/16/2021 0957   TRIG 211 (H) 12/16/2021 0957   HDL 57 12/16/2021 0957   CHOLHDL 5.0 (H) 12/16/2021 0957   LDLCALC 186 (H) 12/16/2021 0957    Physical Exam:    VS:  There were no vitals taken for this visit.    Wt Readings from Last 3 Encounters:  06/27/22 168 lb 12.8 oz (76.6 kg)  05/27/22 165 lb 12.8 oz (75.2 kg)  03/30/22 168 lb (76.2 kg)    GEN: Well nourished, well developed in no acute distress HEENT: Normal, moist mucous membranes NECK: No JVD CARDIAC: regular rhythm, normal S1 and S2, no rubs or gallops. No murmur. VASCULAR: Radial and DP pulses 2+ bilaterally. No carotid bruits RESPIRATORY:  Clear to auscultation without rales, wheezing or rhonchi  ABDOMEN: Soft, non-tender, non-distended MUSCULOSKELETAL:  Ambulates independently SKIN: Warm and dry, no  edema NEUROLOGIC:  Alert and oriented x 3. No focal neuro deficits noted. PSYCHIATRIC:  Normal affect    ASSESSMENT:    No diagnosis found.  PLAN:    Hypertension, labile -notes that home readings are much better controlled. Consistently elevated in the office -she is working to manage BP with breathing, exercise, stress management -has tolerated only HCTZ recently -has hydralazine 10 mg q6 hours PRN for systolic BP >824 -appreciate assistance from advanced hypertension clinic -after discussion, she feels that today's readings are out of her normal recent range. She will follow up with hypertension clinic in the near future but would like to avoid additional medications if possible  Intolerances: Spironolactone: rash beta blockers: felt terrible (remotely) Amlodipine: lightheadedness Minoxidil: face/tongue swelling Lisinopril: lightheadedness, stomach pain Valsartan: lightheadedness, nausea, stomach pain  -lab workup for secondary causes summarized in HPI.   Mixed hyperlipidemia Coronary calcification -She has not tolerated statins. Discussed non statin alternatives, she does not want to try any medications. We did discuss long term risk of heart attack and stroke and how we use medications to optimize this. She feels she is at her limit and cannot do anything more in terms of medications right now.  -calcium score 05/07/21 showed Ca score=27.7  Cardiac risk counseling and prevention recommendations: -we have discussed diet and exercise recommendations -recommend avoidance of tobacco products. Avoid excess alcohol.  Plan for follow up: 6 months or sooner as needed.  Buford Dresser, MD, PhD, [facility]    Medication Adjustments/Labs and Tests Ordered: Current medicines are reviewed at length with the patient today.  Concerns regarding medicines are outlined above.   No orders of the defined types were placed in this encounter.  No orders of  the defined types were placed in this encounter.  There are no Patient Instructions on file for this visit.   I,Mathew Stumpf,acting as a Education administrator for [employer], MD.,have documented all relevant documentation on the behalf of Buford Dresser, MD,as directed by  Buford Dresser, MD while in the presence of Buford Dresser, MD.  I,  Buford Dresser, MD, have reviewed all documentation for this visit. The documentation on 08/28/22 for the exam, diagnosis, procedures, and orders are all accurate and complete.   Signed, Buford Dresser, MD PhD 08/25/2022  8:54 AM [place]

## 2022-08-25 NOTE — Patient Instructions (Addendum)
Medication Instructions:  Your physician recommends that you continue on your current medications as directed. Please refer to the Current Medication list given to you today.   *If you need a refill on your cardiac medications before your next appointment, please call your pharmacy*  Lab Work: NONE  Testing/Procedures: NONE   Follow-Up: At [facility], you and your health needs are our priority.  As part of our continuing mission to provide you with exceptional heart care, we have created designated Provider Care Teams.  These Care Teams include your primary Cardiologist (physician) and Advanced Practice Providers (APPs -  Physician Assistants and Nurse Practitioners) who all work together to provide you with the care you need, when you need it.  We recommend signing up for the patient portal called "MyChart".  Sign up information is provided on this After Visit Summary.  MyChart is used to connect with patients for Virtual Visits (Telemedicine).  Patients are able to view lab/test results, encounter notes, upcoming appointments, etc.  Non-urgent messages can be sent to your provider as well.   To learn more about what you can do with MyChart, go to [email or web address].    Your next appointment:   6 month(s)  The format for your next appointment:   In Person  Provider:   Bridgette Christopher, MD             

## 2022-08-28 ENCOUNTER — Encounter (HOSPITAL_BASED_OUTPATIENT_CLINIC_OR_DEPARTMENT_OTHER): Payer: Self-pay | Admitting: Cardiology

## 2022-08-30 NOTE — Progress Notes (Signed)
Advanced [facility]pertension Clinic Follow-up:    Date:  08/31/2022   ID:  Regina Marsh, DOB 1946/09/12, MRN [id number]  PCP:  Shon Hale, MD  Cardiologist:  Jodelle Red, MD  Nephrologist:  Referring MD: Shon Hale, *   CC: Hypertension  History of Present Illness:    Regina Marsh is a 76 y.o. female with a hx of hypertension, hyperlipidemia, Sjogren's disease, GERD, here for follow-up. She was initially seen 9.18/2023 in the [facility].   She saw Dr. Cristal Deer on 05/27/2022 and reported blood pressures ranging 119-190 systolic at home. She had stopped amlodipine due to lightheadedness, and was only taking HCTZ. She was prescribed hydralazine 10 mg every 6 hours as needed for systolic BP >160. She was referred to the Advanced Hypertension clinic for further evaluation and management. She had normal catecholamines and metanephrines. Renal dopplers were normal 06/2022. She had an Echo 01/2022 that revealed LVEF 60-65% with mild LVH and grade 1 diastolic dysfunction. She wore a 24 hr blood pressure monitor in 2021 that showed an average blood pressure of 176/79.  At her last appointment she complained of feeling fatigued easily. She had COVID infections 10/2018 and 09/2021. 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 Her blood pressure was still very labile ranging from the 90s-150s/60s-80s. Her BP was 252/98 in the office  After further discussion we noted that her blood pressures seem to drop quite precipitously even with adding low doses of additional medications to her HCTZ. It was felt she would benefit from treatment of anxiety and better pain control. She was advised to start taking Tylenol before her physical therapy sessions.Labs for renin and aldosterone levels were ordered and within normal limits. On 08/25/2022 she followed up with Dr. Cristal Deer and her blood pressure was  186/94. However she noted at home readings were better controlled. She was working to manage BP with breathing, exercise, and stress management.  Today, she presents her BP log, and describes results as "up, down, and all over the place". She ranges as low as 98/65 and as high as the 150s. Overall, her readings are averaging in the 140s. She had dental work a couple weeks ago, and her reading at that visit was 145 systolic. She attributes today's high BP reading (192/93) to a stressful phone call earlier this morning. Normally, she uses the treadmill 30-45 minutes every morning which lowers her BP, but she has not been on treadmill this morning. If her blood pressure is over 160 systolic, she prefers to try walking on the treadmill instead of taking extra hydralazine. She notes that this helps drop her BP by 20 points. Her right arm has not fully recovered from surgery. Therefore, she can't lift heavy weight and has limited ROM. She can only reach back at certain angles. Her pain has not been improving but she continues to complete regular stretches. She is still able to complete all of her housework, cooking, and shopping. Since her surgery, meloxicam has caused weight gain. She continues to work on weight loss; she is careful with her diet and salt intake. She confirms that she has been intolerant of many medications in the past, including statins. She denies any palpitations, chest pain, shortness of breath, or peripheral edema. No lightheadedness, headaches, syncope, orthopnea, or PND.  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   aspirin EC 81 MG tablet Take 81 mg by mouth daily. Swallow whole.   Cholecalciferol (VITAMIN D3) 50 MCG (2000 UT) capsule Take 4,000 Units by mouth daily. Take 2 tablets daily   D-MANNOSE PO Take 50 mg by mouth daily.   diphenhydrAMINE (BENADRYL) 25 MG tablet Take 0.5 tablets by mouth daily as needed (allergies).   doxazosin (CARDURA) 1 MG tablet Take 1 tablet (1 mg total) by mouth at bedtime.   famotidine (PEPCID) 20 MG tablet Take 1 tablet (20 mg total) by mouth 2 (two) times daily.   Garlic (GARLIQUE PO) Take 1 tablet by mouth daily.   hydrALAZINE (APRESOLINE) 10 MG tablet Take 1 tablet (10 mg total) by mouth every 6 (six) hours as needed (systolic blood pressure >160).   hydrochlorothiazide (HYDRODIURIL) 25 MG tablet TAKE ONE TABLET BY MOUTH DAILY   L-THEANINE PO Take 1 tablet by mouth daily as needed (pain).   Lactase (LACTAID PO) Take 1-3 tablets by mouth 3 (three) times daily as needed (when consuming milk products).   Lactobacillus (PROBIOTIC ACIDOPHILUS PO) Take 1 tablet by mouth daily.   Melatonin 500 MCG TBDP Take 500 mcg by mouth daily as needed (sleep).   Menatetrenone (VITAMIN K2) 100 MCG TABS Take 100 mcg by mouth daily.   Multiple Vitamin (MULTIVITAMIN WITH MINERALS) TABS tablet Take 1 tablet by mouth daily.   OVER THE COUNTER MEDICATION Take 1,500 mg by mouth daily. Beet root   pantoprazole  (PROTONIX) 40 MG tablet Take 1 tablet (40 mg total) by mouth daily. (Patient taking differently: Take 40 mg by mouth daily as needed (acid reflux/heartburn).)   Probiotic Product (PROBIOTIC DAILY PO) Take 1 tablet by mouth daily.   Propylene Glycol (SYSTANE BALANCE OP) Place 1 drop into both eyes 3 (three) times daily as needed (dry eyes).   PSYLLIUM PO Take 1 capsule by mouth daily at 12 noon.   Simethicone (GAS-X PO) Take 1 tablet by mouth daily as needed (flatulance).   Turmeric Curcumin 500 MG CAPS Take 500 mg by mouth daily in the afternoon.     Allergies:   Aleve [naproxen], Allegra [fexofenadine], Amlodipine, Cetirizine hcl, Chlorthalidone, Lactose intolerance (gi), Lisinopril, Loratadine, Pravastatin, Simvastatin, Sulfa antibiotics, Valsartan, African mango [irvingia gabonensis], Penicillins, and Spironolactone   Social History   Socioeconomic History   Marital status: Married    Spouse name: Not on file   Number of children: 1   Years of education: Not on file   Highest education level: Not on file  Occupational History   Occupation: retired Runner, broadcasting/film/video  Tobacco Use   Smoking status: Never   Smokeless tobacco: Never  Vaping Use   Vaping Use: Never used  Substance and Sexual Activity   Alcohol use: Yes    Comment: rare   Drug use: Never   Sexual activity: Not Currently    Birth control/protection: Post-menopausal  Other Topics Concern   Not on file  Social History Narrative   Right handed    Lives in a two story home    Social Determinants of Health   Financial Resource Strain: Not on file  Food Insecurity: Not on file  Transportation Needs: Not on file  Physical Activity: Not on file  Stress: Not on file  Social Connections: Not on file     Family History: The patient's family history includes Heart attack in her mother; Hypertension in her mother; Leukemia in her father; Stroke in her father. There is no history of Allergic rhinitis, Angioedema, Asthma, Atopy,  Eczema, Immunodeficiency, or Urticaria.  ROS:   Please see the history of present illness.    (+) Right shoulder pain All other systems reviewed and are negative.  EKGs/Labs/Other Studies Reviewed:    Bilateral Renal Artery Doppler  06/17/2022: Summary:  Renal:     Right: Normal size right kidney. Abnormal right Resistive Index.         Normal cortical thickness of right kidney. RRV flow present.         No evidence of right renal artery stenosis.  Left:  Normal size of left kidney. Abnormal left Resisitve Index.         Normal cortical thickness of the left kidney. LRV flow         present. No evidence of left renal artery stenosis.   Echo  02/04/2022:  1. Left ventricular ejection fraction, by estimation, is 60 to 65%. The  left ventricle has normal function. The left ventricle has no regional  wall motion abnormalities. There is mild concentric left ventricular  hypertrophy. Left ventricular diastolic  parameters are consistent with Grade I diastolic dysfunction (impaired  relaxation). Elevated left ventricular end-diastolic pressure.   2. Right ventricular systolic function is normal. The right ventricular  size is normal. There is normal pulmonary artery systolic pressure.   3. Left atrial size was mildly dilated.   4. The mitral valve is normal in structure. Trivial mitral valve  regurgitation. No evidence of mitral stenosis.   5. The aortic valve is tricuspid. Aortic valve regurgitation is not  visualized. No aortic stenosis is present.   6. The inferior vena cava is normal in size with greater than 50%  respiratory variability, suggesting right atrial pressure of 3 mmHg.   CTA Head/Neck  08/16/2021: IMPRESSION: There is no acute intracranial hemorrhage or evidence of acute infarction. ASPECT score is 10.   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   Intracranial atherosclerosis. Moderate stenosis right vertebral artery  and moderate to marked stenosis left vertebral artery just beyond PICA origin. There is stenosis at the left PICA origin. The posterior cerebral arteries are poorly visualized and likely diffusely irregular and stenotic. Probable early left MCA bifurcation with superimposed stenosis. Stenosis of proximal left A1 ACA.   No hemodynamically significant stenosis in the neck. Noncalcified plaque at the left ICA origin with less than 50% stenosis.  Coronary Calcium Score  05/07/2021: FINDINGS: Coronary arteries: Normal origins.   Coronary Calcium Score:   Left main: 0  Left anterior descending artery: 1.42   Left circumflex artery: 0   Right coronary artery: 26.3   Total: 27.7   Percentile: 42nd   Pericardium: Normal.   Ascending Aorta: Normal caliber.   Non-cardiac: See separate report from [facility].   IMPRESSION: Coronary calcium score of 27.7. This was 42nd percentile for age-, race-, and sex-matched controls.  Monitor  09/2020: 14 days of data recorded on Zio monitor. Patient had a min HR of 45 bpm, max HR of 184 bpm, and avg HR of 68 bpm. Predominant underlying rhythm was Sinus Rhythm. No VT, atrial fibrillation, high degree block, or pauses noted. 4 Supraventricular Tachycardia runs occurred, the run with the fastest interval lasting 6 beats with a max rate of 184 bpm, the longest lasting 11 beats with an avg rate of 109 bpm. Isolated atrial and ventricular ectopy was rare (<1%). There were 19 triggered events. These are all sinus rhythm based, 3 with PAC and 1 with PVC.  Blood Pressure Monitor  09/07/2020: 24 hour ambulatory blood pressure monitor reviewed. Remained hypertensive throughout study. Overall mean 176/79, HR 69. Systolic dipped less than 1% with sleep. Max reading 201/98.    EKG:  EKG is personally reviewed. 08/31/2022:  EKG was not ordered. 06/27/2022: EKG was not ordered.   Recent Labs: 12/16/2021: ALT 20; TSH 2.730 02/04/2022: BUN 14;  Creatinine, Ser 0.95; Potassium 3.3; Sodium 140   Recent Lipid Panel    Component Value Date/Time   CHOL 283 (H) 12/16/2021 0957   TRIG 211 (H) 12/16/2021 0957   HDL 57 12/16/2021 0957   CHOLHDL 5.0 (H) 12/16/2021 0957   LDLCALC 186 (H) 12/16/2021 0957    Physical Exam:    VS:  BP (!) 196/98 (BP Location: Left Arm, Patient Position: Sitting, Cuff Size: Normal)   Pulse 75   Ht 5\' 2"  (1.575 m)   Wt 167 lb 11.2 oz (76.1 kg)   SpO2 97%   BMI 30.67 kg/m  , BMI Body mass index is 30.67 kg/m. GENERAL:  Well appearing HEENT: Pupils equal round and reactive, fundi not visualized, oral mucosa unremarkable NECK:  No jugular venous distention, waveform within normal limits, carotid upstroke brisk and symmetric, no bruits, no thyromegaly LUNGS:  Clear to auscultation bilaterally HEART:  RRR.  PMI not displaced or sustained,S1 and S2 within normal limits, no S3, no S4, no clicks, no rubs, no murmurs ABD:  Flat, positive bowel sounds normal in frequency in pitch, no bruits, no rebound, no guarding, no midline pulsatile mass, no hepatomegaly, no splenomegaly EXT:  2 plus pulses throughout, no edema, no cyanosis no clubbing SKIN:  No rashes no nodules NEURO:  Cranial nerves II through XII grossly intact, motor grossly intact throughout PSYCH:  Cognitively intact, oriented to person place and time   ASSESSMENT/PLAN:    Essential hypertension, benign Blood pressure has been better at home but still not controlled.  At home it is mostly in the 140s but ranged from 90s to 160.we discussed the fact that this still puts her at risk for heart attacks and strokes.  Work-up for secondary causes has been negative.  She is amenable to trying doxazosin 1 mg nightly.  Continue HCTZ.  She has hydralazine to take as needed.  Continue with her regular exercise and limiting sodium intake.  Coronary artery calcification seen on CT scan Coronary calcium noted on calcium score.  She has had adverse reactions to  statins in is not willing to try any others.  We did  discuss PCSK9 inhibitors or inclisiran.  Given that we are starting 1 medication we will not start this at this time.  Recommend that she continue this discussion with Dr. Cristal Deerhristopher.    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  Limits caffeine.  No EtOH.  No NSAIDs  Renovascular HTN  Not Screened Screened  Sleep Apnea  Not Screened Screened     - Comments   no snoring  Hyperthyroidism Screened       - Comments TSH WNL 12/2020    Thyroid Disease Screened  Screened     - Comments TSH WNL 12/2020    Hyperaldosteronism  Not Screened Screened     - Comments   check renin/aldosterone  Pheochromocytoma Screened  Screened     - Comments plasm (05/2020) and urine (11/2020) metanephrines both WNL    Cushing's Syndrome Screened  Screened     - Comments cortison WNL (11/2020)    Hyperparathyroidism   Screened  Coarctation of the Aorta   Screened     - Comments   BP symmetric  Compliance Screened  Screened    Relevant Labs/Studies:    Latest Ref Rng & Units 02/04/2022    1:49 PM 12/16/2021    9:57 AM 08/16/2021    8:53 AM  Basic Labs  Sodium 135 - 145 mmol/L 140  140  141   Potassium 3.5 - 5.1 mmol/L 3.3  3.4  3.1   Creatinine 0.44 - 1.00 mg/dL 9.620.95  9.520.91  8.410.98        Latest Ref Rng & Units 12/16/2021    9:57 AM 08/16/2021    8:53 AM  Thyroid   TSH 0.450 - 4.500 uIU/mL 2.730  1.302        Latest Ref Rng & Units 06/28/2022    8:07 AM  Renin/Aldosterone   Aldosterone 0.0 - 30.0 ng/dL 32.417.3   Renin 4.0100.167 - 2.7255.380 ng/mL/hr 1.893   Aldos/Renin Ratio 0.0 - 30.0 9.1              06/17/2022    9:24 AM  Renovascular   Renal Artery US Completed Yes    Disposition:    FU with Ernan Runkles C. Duke Salviaandolph, MD, [facility] in 2 months.   Medication Adjustments/Labs and Tests  Ordered: Current medicines are reviewed at length with the patient today.  Concerns regarding medicines are outlined above.   No orders of the defined types were placed in this encounter.  Meds ordered this encounter  Medications   doxazosin (CARDURA) 1 MG tablet    Sig: Take 1 tablet (1 mg total) by mouth at bedtime.    Dispense:  90 tablet    Refill:  1    I,Mitra Faeizi,acting as a Neurosurgeonscribe for DIRECT[employer]iffany [place], MD.,have documented all relevant documentation on the behalf of Chilton Siiffany [employer], MD,as directed by  Chilton Siiffany [place], MD while in the presence of Chilton Siiffany [place], MD.  I, Chanceler Pullin C. Duke Salviaandolph, MD have reviewed all documentation for this visit.  The documentation of the exam, diagnosis, procedures, and orders on 08/31/2022 are all accurate and complete.   Signed, Chilton Siiffany [place], MD  08/31/2022 10:24 AM    [facility]

## 2022-08-31 ENCOUNTER — Ambulatory Visit (HOSPITAL_BASED_OUTPATIENT_CLINIC_OR_DEPARTMENT_OTHER): Payer: Medicare Other | Admitting: Cardiovascular Disease

## 2022-08-31 ENCOUNTER — Encounter (HOSPITAL_BASED_OUTPATIENT_CLINIC_OR_DEPARTMENT_OTHER): Payer: Self-pay | Admitting: Cardiovascular Disease

## 2022-08-31 VITALS — BP 196/98 | HR 75 | Ht 62.0 in | Wt 167.7 lb

## 2022-08-31 DIAGNOSIS — I2510 Atherosclerotic heart disease of native coronary artery without angina pectoris: Secondary | ICD-10-CM

## 2022-08-31 DIAGNOSIS — I10 Essential (primary) hypertension: Secondary | ICD-10-CM

## 2022-08-31 MED ORDER — DOXAZOSIN MESYLATE 1 MG PO TABS: 1.0000 mg | ORAL_TABLET | Freq: Every day | ORAL | 1 refills | Status: DC

## 2022-08-31 NOTE — Patient Instructions (Addendum)
Medication Instructions:  START DOXAZOSIN 1 MG AT BEDTIME   Labwork: NONE  Testing/Procedures: NONE  Follow-Up: 11/01/2022 8:45 WITH DR    MONITOR YOUR BLOOD PRESSURE TWICE A DAY AND LOG. BRING LOG AND YOUR BLOOD PRESSURE MACHINE TO YOUR FOLLOW UP

## 2022-08-31 NOTE — Assessment & Plan Note (Signed)
Coronary calcium noted on calcium score.  She has had adverse reactions to statins in is not willing to try any others.  We did discuss PCSK9 inhibitors or inclisiran.  Given that we are starting 1 medication we will not start this at this time.  Recommend that she continue this discussion with Dr. Cristal Deer.

## 2022-08-31 NOTE — Assessment & Plan Note (Signed)
Blood pressure has been better at home but still not controlled.  At home it is mostly in the 140s but ranged from 90s to 160.we discussed the fact that this still puts her at risk for heart attacks and strokes.  Work-up for secondary causes has been negative.  She is amenable to trying doxazosin 1 mg nightly.  Continue HCTZ.  She has hydralazine to take as needed.  Continue with her regular exercise and limiting sodium intake.

## 2022-09-06 DIAGNOSIS — Z6830 Body mass index (BMI) 30.0-30.9, adult: Secondary | ICD-10-CM | POA: Diagnosis not present

## 2022-09-06 DIAGNOSIS — M4712 Other spondylosis with myelopathy, cervical region: Secondary | ICD-10-CM | POA: Diagnosis not present

## 2022-10-31 NOTE — Progress Notes (Signed)
[facility] Follow-up:    Date:  11/01/2022   ID:  Regina Marsh, DOB 1945-11-19, MRN [id number]  PCP:  Glenis Smoker, MD  Cardiologist:  Buford Dresser, MD  Nephrologist:  Referring MD: Glenis Smoker, *   CC: Hypertension  History of Present Illness:    Regina Marsh is a 77 y.o. female with a hx of hypertension, hyperlipidemia, Sjogren's disease, GERD, here for follow-up. She was initially seen 9.18/2023 in the [facility].   She saw Dr. Harrell Gave on 05/27/2022 and reported blood pressures ranging 371-062 systolic at home. She had stopped amlodipine due to lightheadedness, and was only taking HCTZ. She was prescribed hydralazine 10 mg every 6 hours as needed for systolic BP >694. She was referred to the Advanced Hypertension clinic for further evaluation and management. She had normal catecholamines and metanephrines. Renal dopplers were normal 06/2022. She had an Echo 01/2022 that revealed LVEF 60-65% with mild LVH and grade 1 diastolic dysfunction. She wore a 24 hr blood pressure monitor in 2021 that showed an average blood pressure of 176/79.  She complained of feeling fatigued easily. She had COVID infections 10/2018 and 09/2021. 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 Her blood pressure was still very labile ranging from the 90s-150s/60s-80s. Her BP was 252/98 in the office  After further discussion we noted that her blood pressures seem to drop quite precipitously even with adding low doses of additional medications to her HCTZ. It was felt she would benefit from treatment of anxiety and better pain control. She was advised to start taking Tylenol before her physical therapy sessions.Labs for renin and aldosterone levels were ordered and within normal limits. On 08/25/2022 she followed up with Dr. Harrell Gave and her blood pressure was 186/94. However she  noted at home readings were better controlled. She was working to manage BP with breathing, exercise, and stress management.  She reported her blood pressures were very labile ranging from the 85I to 627O systolic. She was started on low dose doxazosin at night. Today, she states she is tolerating the medication, but has been experiencing a lot of fatigue and some lightheadedness and headaches. She is also concerned that she hears a clicking when turning her neck from side to side. Her symptoms have an onset of 1 week ago. She believes that she may be developing another pinched nerve. Additionally she is concerned that her Sjogren's is worsening lately due to dry eyes and dry mouth. This morning her BP was 139/86. In clinic it is elevated at 223/97 (212/94 on recheck) which she attributes to driving to her appointment. Of note, she hasn't taken HCTZ today; she normally after she exercises around 9:30 AM. Usually she checks her blood pressure around this time as well. At home she generally reports labile blood pressures, mostly 130s-150s over 70s-90s with one low reading at 95/66. At night she takes her 1 mg doxazosin. First thing every morning she exercises 30 minutes on a treadmill, and 15 minutes of strength training and stretching. She denies any palpitations, chest pain, shortness of breath, or peripheral edema. No syncope, orthopnea, or PND.  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   aspirin EC 81 MG tablet Take 81 mg by mouth daily. Swallow whole.   chlorthalidone (HYGROTON) 25 MG tablet Take 1 tablet (25 mg total) by mouth daily.   Cholecalciferol (VITAMIN D3) 50 MCG (2000 UT) capsule Take 4,000 Units by mouth daily. Take 2 tablets daily   D-MANNOSE PO Take 50 mg by mouth daily.   diphenhydrAMINE (BENADRYL) 25 MG tablet Take 0.5 tablets by mouth daily as needed (allergies).   doxazosin (CARDURA) 1 MG tablet Take 1 tablet (1 mg total) by mouth at bedtime.   famotidine (PEPCID) 20 MG tablet Take 1 tablet (20 mg total) by mouth 2 (two) times daily.   Garlic (GARLIQUE PO) Take 1 tablet by mouth daily.   hydrALAZINE (APRESOLINE) 10 MG tablet Take 1 tablet (10 mg total) by mouth every 6 (six) hours as needed (systolic blood pressure [id number]).   L-THEANINE PO Take 1 tablet by mouth daily as needed (pain).   Lactase (LACTAID PO) Take 1-3 tablets by mouth 3 (three) times daily as needed (when consuming milk products).   Lactobacillus (PROBIOTIC ACIDOPHILUS PO) Take 1 tablet by mouth daily.   Melatonin 500 MCG TBDP Take 500 mcg by mouth daily as needed (sleep).   Menatetrenone (VITAMIN K2) 100 MCG TABS Take 100 mcg by mouth daily.   Multiple Vitamin (MULTIVITAMIN WITH MINERALS) TABS tablet Take 1 tablet by mouth daily.   OVER THE COUNTER MEDICATION Take 1,500 mg by mouth daily. Beet root   Probiotic Product (PROBIOTIC DAILY PO) Take 1 tablet by mouth daily.   Propylene Glycol (SYSTANE BALANCE OP) Place 1 drop into both eyes 3 (three) times daily  as needed (dry eyes).   PSYLLIUM PO Take 1 capsule by mouth daily at 12 noon.   Simethicone (GAS-X PO) Take 1 tablet by mouth daily as needed (flatulance).   Turmeric Curcumin 500 MG CAPS Take 500 mg by mouth daily in the afternoon.   [DISCONTINUED] hydrochlorothiazide (HYDRODIURIL) 25 MG tablet TAKE ONE TABLET BY MOUTH DAILY     Allergies:   Aleve [naproxen], Allegra [fexofenadine], Amlodipine, Cetirizine hcl, Chlorthalidone, Lactose intolerance (gi), Lisinopril, Loratadine, Pravastatin, Simvastatin, Sulfa antibiotics, Valsartan, African mango [irvingia gabonensis], Penicillins, and Spironolactone   Social History   Socioeconomic History   Marital status: Married    Spouse name: Not on file   Number of children: 1   Years of education: Not on file   Highest education level: Not on file  Occupational History   Occupation: retired Pharmacist, hospital  Tobacco Use   Smoking status: Never   Smokeless tobacco: Never  Vaping Use   Vaping Use: Never used  Substance and Sexual Activity   Alcohol use: Yes  Comment: rare   Drug use: Never   Sexual activity: Not Currently    Birth control/protection: Post-menopausal  Other Topics Concern   Not on file  Social History Narrative   Right handed    Lives in a two story home    Social Determinants of Health   Financial Resource Strain: Not on file  Food Insecurity: Not on file  Transportation Needs: Not on file  Physical Activity: Not on file  Stress: Not on file  Social Connections: Not on file     Family History: The patient's family history includes Heart attack in her mother; Hypertension in her mother; Leukemia in her father; Stroke in her father. There is no history of Allergic rhinitis, Angioedema, Asthma, Atopy, Eczema, Immunodeficiency, or Urticaria.  ROS:   Please see the history of present illness.    (+) Fatigue (+) Lightheadedness (+) Headaches (+) Dry eyes and mouth All other systems reviewed and are  negative.  EKGs/Labs/Other Studies Reviewed:    Bilateral Renal Artery Doppler  06/17/2022: Summary:  Renal:     Right: Normal size right kidney. Abnormal right Resistive Index.         Normal cortical thickness of right kidney. RRV flow present.         No evidence of right renal artery stenosis.  Left:  Normal size of left kidney. Abnormal left Resisitve Index.         Normal cortical thickness of the left kidney. LRV flow         present. No evidence of left renal artery stenosis.   Echo  02/04/2022:  1. Left ventricular ejection fraction, by estimation, is 60 to 65%. The  left ventricle has normal function. The left ventricle has no regional  wall motion abnormalities. There is mild concentric left ventricular  hypertrophy. Left ventricular diastolic  parameters are consistent with Grade I diastolic dysfunction (impaired  relaxation). Elevated left ventricular end-diastolic pressure.   2. Right ventricular systolic function is normal. The right ventricular  size is normal. There is normal pulmonary artery systolic pressure.   3. Left atrial size was mildly dilated.   4. The mitral valve is normal in structure. Trivial mitral valve  regurgitation. No evidence of mitral stenosis.   5. The aortic valve is tricuspid. Aortic valve regurgitation is not  visualized. No aortic stenosis is present.   6. The inferior vena cava is normal in size with greater than 50%  respiratory variability, suggesting right atrial pressure of 3 mmHg.   CTA Head/Neck  08/16/2021: IMPRESSION: There is no acute intracranial hemorrhage or evidence of acute infarction. ASPECT score is 10.   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   Intracranial atherosclerosis. Moderate stenosis right vertebral artery and moderate to marked stenosis left vertebral artery just beyond PICA origin. There is stenosis at the left PICA origin. The posterior cerebral arteries are  poorly visualized and likely diffusely irregular and stenotic. Probable early left MCA bifurcation with superimposed stenosis. Stenosis of proximal left A1 ACA.   No hemodynamically significant stenosis in the neck. Noncalcified plaque at the left ICA origin with less than 50% stenosis.  Coronary Calcium Score  05/07/2021: FINDINGS: Coronary arteries: Normal origins.   Coronary Calcium Score:   Left main: 0   Left anterior descending artery: 1.42   Left circumflex artery: 0   Right coronary artery: 26.3   Total: 27.7   Percentile: 42nd   Pericardium: Normal.   Ascending Aorta: Normal  caliber.   Non-cardiac: See separate report from [facility].   IMPRESSION: Coronary calcium score of 27.7. This was 42nd percentile for age-, race-, and sex-matched controls.  Monitor  09/2020: 14 days of data recorded on Zio monitor. Patient had a min HR of 45 bpm, max HR of 184 bpm, and avg HR of 68 bpm. Predominant underlying rhythm was Sinus Rhythm. No VT, atrial fibrillation, high degree block, or pauses noted. 4 Supraventricular Tachycardia runs occurred, the run with the fastest interval lasting 6 beats with a max rate of 184 bpm, the longest lasting 11 beats with an avg rate of 109 bpm. Isolated atrial and ventricular ectopy was rare (<1%). There were 19 triggered events. These are all sinus rhythm based, 3 with PAC and 1 with PVC.  Blood Pressure Monitor  09/07/2020: 24 hour ambulatory blood pressure monitor reviewed. Remained hypertensive throughout study. Overall mean 176/79, HR 69. Systolic dipped less than 1% with sleep. Max reading 201/98.    EKG:  EKG is personally reviewed. 11/01/2022:  EKG was not ordered. 08/31/2022:  EKG was not ordered. 06/27/2022: EKG was not ordered.   Recent Labs: 12/16/2021: ALT 20; TSH 2.730 02/04/2022: BUN 14; Creatinine, Ser 0.95; Potassium 3.3; Sodium 140   Recent Lipid Panel    Component Value Date/Time   CHOL 283 (H) 12/16/2021  0957   TRIG 211 (H) 12/16/2021 0957   HDL 57 12/16/2021 0957   CHOLHDL 5.0 (H) 12/16/2021 0957   LDLCALC 186 (H) 12/16/2021 0957    Physical Exam:    VS:  BP (!) 212/94 (BP Location: Left Arm, Patient Position: Sitting, Cuff Size: Normal)   Pulse 86   Ht 5\' 2"  (1.575 m)   Wt 169 lb 4.8 oz (76.8 kg)   SpO2 97%   BMI 30.97 kg/m  , BMI Body mass index is 30.97 kg/m. GENERAL:  Well appearing HEENT: Pupils equal round and reactive, fundi not visualized, oral mucosa unremarkable NECK:  No jugular venous distention, waveform within normal limits, carotid upstroke brisk and symmetric, no bruits, no thyromegaly LUNGS:  Clear to auscultation bilaterally HEART:  RRR.  PMI not displaced or sustained,S1 and S2 within normal limits, no S3, no S4, no clicks, no rubs, no murmurs ABD:  Flat, positive bowel sounds normal in frequency in pitch, no bruits, no rebound, no guarding, no midline pulsatile mass, no hepatomegaly, no splenomegaly EXT:  2 plus pulses throughout, no edema, no cyanosis no clubbing SKIN:  No rashes no nodules NEURO:  Cranial nerves II through XII grossly intact, motor grossly intact throughout PSYCH:  Cognitively intact, oriented to person place and time   ASSESSMENT/PLAN:    Primary hypertension Blood pressure is extremely elevated today.  At home it was much lower.  It was 139 systolic this morning.  Her blood pressure at home is ranged from the 130s to 150s.  We will switch her HCTZ to chlorthalidone 25 mg daily.  Check a BMP in a week.  If she is tolerating the chlorthalidone we will increase to 50 mg.  Continue doxazosin.  We discussed renal denervation and she is going to consider but not interested at this time.  Mixed hyperlipidemia Recommend considering PCSK9 inhibitor with pharmacist and her primary cardiologist.  She has been intolerant to statins and not interested in trying others.   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  Limits caffeine.  No EtOH.  No NSAIDs  Renovascular HTN  Not Screened Screened  Sleep Apnea  Not Screened Screened     - Comments   no snoring  Hyperthyroidism Screened       - Comments TSH WNL 12/2020    Thyroid Disease Screened  Screened     - Comments TSH WNL 12/2020    Hyperaldosteronism  Not Screened Screened     - Comments   check renin/aldosterone  Pheochromocytoma Screened  Screened     - Comments plasm (05/2020) and urine (11/2020) metanephrines both WNL    Cushing's Syndrome Screened  Screened     - Comments cortison WNL (11/2020)    Hyperparathyroidism   Screened  Coarctation of the Aorta   Screened     - Comments   BP symmetric  Compliance Screened  Screened    Relevant Labs/Studies:    Latest Ref Rng & Units 02/04/2022    1:49 PM 12/16/2021    9:57 AM 08/16/2021    8:53 AM  Basic Labs  Sodium 135 - 145 mmol/L 140  140  141   Potassium 3.5 - 5.1 mmol/L 3.3  3.4  3.1   Creatinine 0.44 - 1.00 mg/dL 0.95  0.91  0.98        Latest Ref Rng & Units 12/16/2021    9:57 AM 08/16/2021    8:53 AM  Thyroid   TSH 0.450 - 4.500 uIU/mL 2.730  1.302        Latest Ref Rng & Units 06/28/2022    8:07 AM  Renin/Aldosterone   Aldosterone 0.0 - 30.0 ng/dL 17.3   Renin 0.167 - 5.380 ng/mL/hr 1.893   Aldos/Renin Ratio 0.0 - 30.0 9.1              06/17/2022    9:24 AM  Renovascular   Renal Artery [name] Completed Yes    Disposition:    FU with APP/PharmD in 1 month.   Medication Adjustments/Labs and Tests Ordered: Current medicines are reviewed at length with the patient today.  Concerns regarding medicines are outlined above.   Orders Placed This Encounter  Procedures   Basic metabolic panel   Meds ordered this encounter  Medications   chlorthalidone (HYGROTON) 25 MG tablet    Sig: Take 1 tablet (25 mg total) by mouth daily.    Dispense:   90 tablet    Refill:  3    D/C HCTZY   I,Mathew Stumpf,acting as a scribe for Skeet Latch, MD.,have documented all relevant documentation on the behalf of Skeet Latch, MD,as directed by  Skeet Latch, MD while in the presence of Skeet Latch, MD.  I, Patrick Oval Linsey, MD have reviewed all documentation for this visit.  The documentation of the exam, diagnosis, procedures, and orders on 11/01/2022 are all accurate and complete.  Signed, Skeet Latch, MD  11/01/2022 9:42 AM    [place]

## 2022-11-01 ENCOUNTER — Ambulatory Visit (HOSPITAL_BASED_OUTPATIENT_CLINIC_OR_DEPARTMENT_OTHER): Payer: Medicare Other | Admitting: Cardiovascular Disease

## 2022-11-01 ENCOUNTER — Encounter (HOSPITAL_BASED_OUTPATIENT_CLINIC_OR_DEPARTMENT_OTHER): Payer: Self-pay | Admitting: Cardiovascular Disease

## 2022-11-01 VITALS — BP 212/94 | HR 86 | Ht 62.0 in | Wt 169.3 lb

## 2022-11-01 DIAGNOSIS — E782 Mixed hyperlipidemia: Secondary | ICD-10-CM | POA: Diagnosis not present

## 2022-11-01 DIAGNOSIS — I10 Essential (primary) hypertension: Secondary | ICD-10-CM | POA: Diagnosis not present

## 2022-11-01 DIAGNOSIS — Z5181 Encounter for therapeutic drug level monitoring: Secondary | ICD-10-CM | POA: Diagnosis not present

## 2022-11-01 MED ORDER — CHLORTHALIDONE 25 MG PO TABS: 25.0000 mg | ORAL_TABLET | Freq: Every day | ORAL | 3 refills | Status: DC

## 2022-11-01 NOTE — Assessment & Plan Note (Signed)
Recommend considering PCSK9 inhibitor with pharmacist and her primary cardiologist.  She has been intolerant to statins and not interested in trying others.

## 2022-11-01 NOTE — Assessment & Plan Note (Signed)
Blood pressure is extremely elevated today.  At home it was much lower.  It was 707 systolic this morning.  Her blood pressure at home is ranged from the 130s to 150s.  We will switch her HCTZ to chlorthalidone 25 mg daily.  Check a BMP in a week.  If she is tolerating the chlorthalidone we will increase to 50 mg.  Continue doxazosin.  We discussed renal denervation and she is going to consider but not interested at this time.

## 2022-11-01 NOTE — Patient Instructions (Signed)
Medication Instructions:  STOP HYDROCHLOROTHIAZIDE   START CHLORTHALIDONE 25 MG DAILY   Labwork: BMET IN 1 WEEK   Testing/Procedures: NONE  Follow-Up: 12/01/2022 9:00 AM WITH PHARM D   Any Other Special Instructions Will Be Listed Below (If Applicable). MONITOR YOUR BLOOD PRESSURE TWICE A DAY. IF BLOOD PRESSURE REMAINS ELEVATED INCREASE YOUR CHLORTHALIDONE TO 50 MG DAILY   If you need a refill on your cardiac medications before your next appointment, please call your pharmacy.

## 2022-11-08 DIAGNOSIS — Z5181 Encounter for therapeutic drug level monitoring: Secondary | ICD-10-CM | POA: Diagnosis not present

## 2022-11-08 DIAGNOSIS — I10 Essential (primary) hypertension: Secondary | ICD-10-CM | POA: Diagnosis not present

## 2022-11-08 LAB — BASIC METABOLIC PANEL
BUN/Creatinine Ratio: 21 (ref 12–28)
BUN: 20 mg/dL (ref 8–27)
CO2: 24 mmol/L (ref 20–29)
Calcium: 10 mg/dL (ref 8.7–10.3)
Chloride: 99 mmol/L (ref 96–106)
Creatinine, Ser: 0.95 mg/dL (ref 0.57–1.00)
Glucose: 112 mg/dL — ABNORMAL HIGH (ref 70–99)
Potassium: 3.6 mmol/L (ref 3.5–5.2)
Sodium: 142 mmol/L (ref 134–144)
eGFR: 62 mL/min/{1.73_m2} (ref >59)

## 2022-12-01 ENCOUNTER — Encounter (HOSPITAL_BASED_OUTPATIENT_CLINIC_OR_DEPARTMENT_OTHER): Payer: Self-pay | Admitting: Pharmacist Clinician (PhC)/ Clinical Pharmacy Specialist

## 2022-12-01 ENCOUNTER — Ambulatory Visit (HOSPITAL_BASED_OUTPATIENT_CLINIC_OR_DEPARTMENT_OTHER): Payer: Medicare Other | Admitting: Pharmacist Clinician (PhC)/ Clinical Pharmacy Specialist

## 2022-12-01 VITALS — BP 198/102 | HR 77 | Ht 62.0 in | Wt 168.4 lb

## 2022-12-01 DIAGNOSIS — I10 Essential (primary) hypertension: Secondary | ICD-10-CM

## 2022-12-01 MED ORDER — HYDRALAZINE HCL 10 MG PO TABS: 10.0000 mg | ORAL_TABLET | Freq: Two times a day (BID) | ORAL | 3 refills | Status: DC

## 2022-12-01 NOTE — Patient Instructions (Signed)
Follow up appointment: Tuesday April 9 at 9 am  Take your BP meds as follows:  ADD HYDRALAZINE 10 MG TWICE DAILY  CONTINUE WITH YOUR OTHER MEDICATIONS  Check your blood pressure at home daily (if able) and keep record of the readings.  Hypertension "High blood pressure"  Hypertension is often called "The Silent Killer." It rarely causes symptoms until it is extremely  high or has done damage to other organs in the body. For this reason, you should have your  blood pressure checked regularly by your physician. We will check your blood pressure  every time you see a provider at one of our offices.   Your blood pressure reading consists of two numbers. Ideally, blood pressure should be  below 120/80. The first ("top") number is called the systolic pressure. It measures the  pressure in your arteries as your heart beats. The second ("bottom") number is called the diastolic pressure. It measures the pressure in your arteries as the heart relaxes between beats.  The benefits of getting your blood pressure under control are enormous. A 10-point  reduction in systolic blood pressure can reduce your risk of stroke by 27% and heart failure by 28%  Your blood pressure goal is <130/80  To check your pressure at home you will need to:  1. Sit up in a chair, with feet flat on the floor and back supported. Do not cross your ankles or legs. 2. Rest your left arm so that the cuff is about heart level. If the cuff goes on your upper arm,  then just relax the arm on the table, arm of the chair or your lap. If you have a wrist cuff, we  suggest relaxing your wrist against your chest (think of it as Pledging the Flag with the  wrong arm).  3. Place the cuff snugly around your arm, about 1 inch above the crook of your elbow. The  cords should be inside the groove of your elbow.  4. Sit quietly, with the cuff in place, for about 5 minutes. After that 5 minutes press the power  button to start a  reading. 5. Do not talk or move while the reading is taking place.  6. Record your readings on a sheet of paper. Although most cuffs have a memory, it is often  easier to see a pattern developing when the numbers are all in front of you.  7. You can repeat the reading after 1-3 minutes if it is recommended  Make sure your bladder is empty and you have not had caffeine or tobacco within the last 30 min  Always bring your blood pressure log with you to your appointments. If you have not brought your monitor in to be double checked for accuracy, please bring it to your next appointment.  You can find a list of quality blood pressure cuffs at validatebp.org

## 2022-12-01 NOTE — Assessment & Plan Note (Signed)
Primary hypertension A:  1. HTN: Goal <130/80. Pt's BP is still very elevated. She reports chlorthalidone to be "drying her out," so will not increase chlorthalidone but leave it as is. Will also continue doxazosin as written. Pt was previously prescribed hydralazine PRN but never had to take it due to her SBP never reaching >160. She was given a new BP log and asked to continue monitoring her BP readings at home.  P:  1. HTN: Continue chlorthalidone 25 mg PO daily and doxazosin 1 mg PO qhs. Start hydralazine 10 mg BID. Will initially start hydralazine at 10 mg PO BID as pt is very sensitive to medications. Pt was receptive.  -Pt to continue monitoring BP at home and bring log of readings to next appointment  -Follow-up with PharmD in 4-6 weeks

## 2022-12-01 NOTE — Progress Notes (Signed)
Office Visit    Patient Name: Regina Marsh Date of Encounter: 12/01/2022  Primary Care Provider:  Binnie Rail, MD Primary Cardiologist:  Skeet Latch, MD  Chief Complaint    Hypertension - Advanced hypertension clinic  Past Medical History     Allergies  Allergen Reactions   Diphenhydramine Hcl Other (See Comments)    Patient lost her fingernails; chemical allergy   Fexofenadine Hcl Other (See Comments)    only Generic Allegra, unknown   Novocain [Procaine Hcl] Anaphylaxis   Xarelto [Rivaroxaban] Other (See Comments)    Vaginal bleeding   Cortisone    Hydrocodone Other (See Comments)    "makes me crazy"   Hallucinations, agitated, etc   Influenza Vaccines Other (See Comments)   Oxycodone Other (See Comments)    "Makes me crazy"    Hallucinations, agitated, etc.   Latex Hives, Itching and Rash   Other Hives, Itching and Rash    RUBBER;    And any narcotic drugs--"makes me crazy"    History of Present Illness    HPI: MH is a 41 YOF with hx of GERD, HA, HLD, HTN, low back pain, and Sjogren's disease presenting today for a HTN follow-up visit after seeing Dr. Oval Linsey on 11/01/2022. Pt's BP during that OV was 223/97 (212/94 on recheck) which she attributed to driving to her appointment that morning. Of note, she had not yet taken her HCTZ at that time. Her home BP the morning of that visit was 139/86. Pt is currently taking chlorthalidone 25 mg PO daily, doxazosin 1 mg PO qhs, and hydralazine 10 mg PO q6h PRN SBP >160.  BP ran below goal of <130/80 at 129/72 on Monday (typically runs higher when she is in pain). Pt does check BP at home using an arm cuff. Pt normally checks BP in the morning/late afternoon. Average home BP: 142/80. Average home HR: 67. Pt is compliant with all of her medications. Denies shortness of breath, dizziness, lightheadedness, edema, or palpitations. Pt complaining of GI issues and weight gain - she is curious to know if this has to do with  her diet. Pt did bring BP monitor. Home BP monitor in OV: 208/110, HR: 84. Pt reports home BP monitor running 10 points higher than office monitor. She reports being in pain today. BP on office monitor today was 198/102, HR: 77.  ROS: As per HPI, otherwise noncontributory  FH: Mother (HTN, MI, kidney failure); Father (leukemia, stroke); Son (overall healthy)  SH: Married. 1 child. Retired Pharmacist, hospital. Never smoker. Never vape use. Never drug use. Rare alcohol use. Pt typically does not eat breakfast. For lunch, she will have a salad, soup, or sandwich. When she does snack, it is on fruit. For dinner, she will typically eat fish, chicken, salad, and vegetables (typically fresh). Pt exercises every morning doing 30 minutes on the treadmill, 10 minutes on weights, and 10 minutes working on her shoulder. She is also very active doing housework and yardwork.  O:  PMH: GERD, HA, HLD, HTN, low back pain, and Sjogren's disease  Meds: HTN: chlorthalidone 25 mg PO daily, doxazosin 1 mg PO qhs, hydralazine 10 mg PO q6h PRN SBP >160 Pt has never had to take hydralazine PRN as SBP never reached >160.  ALL: Aleve (severe bruising), Allegra (abdominal pain), amlodipine (lightheadedness), cetirizine (made her hyper - insomnia), chlorthalidone (lightheadedness), lactose intolerance (diarrhea), lisinopril (swelling of the eyes), loratadine (made her hyper - insomnia), pravastatin (leg pain, loss of memory), simvastatin (leg pain,  loss of memory), sulfa antibiotics (N/V), valsartan (myalgias, cough, swelling of the tongue), African mango (blisters in mouth, swelling of the tongue), penicillins (rash, swelling of the tongue), spironolactone (rash)  VS: BP 198/102, P 77, Ht 5'2" (1.575 m), Wt 168.4 lb, BMI 30.7  Labs: 11/08/2022: Na 142, Glu 112, SCr 0.95, eGFR 62, K 3.6, CrCl 48; 12/14/2021: A1c 5.6%     Accessory Clinical Findings    Lab Results  Component Value Date   CREATININE 0.61 10/14/2022   BUN 17  10/14/2022   NA 139 10/14/2022   K 4.4 10/14/2022   CL 100 10/14/2022   CO2 26 10/14/2022   Lab Results  Component Value Date   ALT 9 08/17/2022   AST 16 08/17/2022   GGT 25 02/22/2021   ALKPHOS 75 08/17/2022   BILITOT 0.4 08/17/2022   Lab Results  Component Value Date   HGBA1C 6.6 (H) 08/17/2022    Screening for Secondary Hypertension:      Relevant Labs/Studies:    Latest Ref Rng & Units 10/14/2022    2:49 PM 08/17/2022    3:16 PM 02/14/2022    1:27 PM  Basic Labs  Sodium 134 - 144 mmol/L 139  140  138   Potassium 3.5 - 5.2 mmol/L 4.4  3.8  3.8   Creatinine 0.57 - 1.00 mg/dL 0.61  0.60  0.77        Latest Ref Rng & Units 09/21/2022    3:00 PM 08/17/2022    3:16 PM  Thyroid   TSH 0.35 - 5.50 uIU/mL 4.05  3.29                  Home Medications    Current Outpatient Medications  Medication Sig Dispense Refill   Accu-Chek Softclix Lancets lancets USE FOUR TIMES DAILY 300 each 1   ACCU-CHEK GUIDE test strip USE FOUR TIMES DAILY 100 strip 5   acetaminophen (TYLENOL) 500 MG tablet Take 2 tablets (1,000 mg total) by mouth every 6 (six) hours as needed for mild pain, fever or headache. 30 tablet 0   apixaban (ELIQUIS) 5 MG TABS tablet Take 1 tablet (5 mg total) by mouth 2 (two) times daily. 60 tablet 6   atorvastatin (LIPITOR) 20 MG tablet TAKE 1 TABLET(20 MG) BY MOUTH DAILY 90 tablet 3   Ferrous Gluconate 239 (27 Fe) MG TABS Take 1 tablet by mouth 3 (three) times a week.     metFORMIN (GLUCOPHAGE) 500 MG tablet Take 1 tablet (500 mg total) by mouth daily with breakfast. 90 tablet 1   metoprolol succinate (TOPROL-XL) 25 MG 24 hr tablet TAKE 1/2 TABLET(12.5 MG) BY MOUTH DAILY 45 tablet 1   pantoprazole (PROTONIX) 40 MG tablet TAKE 1 TABLET(40 MG) BY MOUTH DAILY 90 tablet 1   sertraline (ZOLOFT) 100 MG tablet TAKE 1 TABLET(100 MG) BY MOUTH DAILY 90 tablet 2   valsartan (DIOVAN) 80 MG tablet Take 1 tablet (80 mg total) by mouth daily. 90 tablet 3   No current  facility-administered medications for this visit.     Assessment & Plan   HYPERTENSION CONTROL Vitals:   12/01/22 0915 12/01/22 1157  BP: (!) 208/110 (!) 198/102    The patient's blood pressure is elevated above target today.  In order to address the patient's elevated BP: A new medication was prescribed today.     Primary hypertension A:  1. HTN: Goal <130/80. Pt's BP is still very elevated. She reports chlorthalidone to be "drying her out," so  will not increase chlorthalidone but leave it as is. Will also continue doxazosin as written. Pt was previously prescribed hydralazine PRN but never had to take it due to her SBP never reaching >160. She was given a new BP log and asked to continue monitoring her BP readings at home.  P:  1. HTN: Continue chlorthalidone 25 mg PO daily and doxazosin 1 mg PO qhs. Start hydralazine 10 mg BID. Will initially start hydralazine at 10 mg PO BID as pt is very sensitive to medications. Pt was receptive.  -Pt to continue monitoring BP at home and bring log of readings to next appointment  -Follow-up with PharmD in 4-6 weeks  Donney Rankins, PharmD Candidate,  [employer] of 2024  I was with student and patient for entire visit and agree with above assessment and plan.  Tommy Medal PharmD CPP [place]  [address] [place],  [address] [phone]

## 2022-12-15 ENCOUNTER — Ambulatory Visit: Payer: Medicare Other | Admitting: "Endocrinology

## 2022-12-29 ENCOUNTER — Encounter (HOSPITAL_BASED_OUTPATIENT_CLINIC_OR_DEPARTMENT_OTHER): Payer: Self-pay | Admitting: Pharmacist Clinician (PhC)/ Clinical Pharmacy Specialist

## 2023-01-16 NOTE — Progress Notes (Unsigned)
Office Visit    Patient Name: Regina Marsh Date of Encounter: 01/17/2023  Primary Care Provider:  Shon Hale, MD Primary Cardiologist:  Jodelle Red, MD  Chief Complaint    Hypertension - Advanced hypertension clinic  Past Medical History   CAD CAC 27.7 (42nd percentile)  hyperlipidemia 3/23 LDL 186  Sjogren's   Vit D deficiency 2/22 at 19.4    Allergies  Allergen Reactions   Aleve [Naproxen] Other (See Comments)    Causes severe bruising   Allegra [Fexofenadine] Other (See Comments)    Abdominal pain   Amlodipine Other (See Comments)    lightheadedness   Cetirizine Hcl Other (See Comments)    Made her hyper - caused insomnia   Chlorthalidone Other (See Comments)    lightheadedness   Lactose Intolerance (Gi) Diarrhea   Lisinopril Swelling    Caused eyes to swell   Loratadine Other (See Comments)    Made her hyper - caused insomnia   Pravastatin Other (See Comments)    Leg pain, loss of memory   Simvastatin Other (See Comments)    Leg pain, loss of memory   Sulfa Antibiotics Nausea And Vomiting   Valsartan Swelling and Cough    Myalgias,  tongue swelling   African Mango [Irvingia [place]onensis] Swelling, Rash and Other (See Comments)    Blisters in mouth and tongue swelling   Penicillins Swelling and Rash    Tongue swelling   Spironolactone Rash    History of Present Illness    Regina Marsh is a 77 y.o. female patient who was referred to the Advanced Hypertension Clinic by Dr. Garth Bigness.   She has been seen by both Dr. Cristal Deer and Dr. Duke Salvia, and at most visits her office reading is 180-200 systolic and > 90 diastolic.  I saw her last in February, at which time her office reading was still elevated, at 198/102.  She has multiple medication intolerances, making it harder to find a good combination of medications for her.  We switched the hydralazine from prn to 10 mg bid, but within a month had to stop because of headaches and  stomach pain.  She was advised to continue with her chlorthalidone and doxazosin and return for her next appointment, about 2 weeks out.  Today she returns for follow up.  Since her last visit she was only on the hydralazine for a few weeks.  Stated it caused her to have joint pain and fatigue.  Her legs were aching and she was having insomnia because of the pain.  She stopped the medication and felt better after 2-3 days without.  She then realized that the chlorthalidone was also problematic, in that she was having to avoid taking if she would go out of the house.  It caused her to be lightheaded, nauseated and she felt it increased her BP.  Today she is only on the doxazosin.  She does note that HCTZ has been a good medication for her in the past and would like to try that.  She also wants to discuss the idea that her BP is so erratic because of ADHD.  Her son (53) was recently diagnosed with ADHD and started on Vyvanse.  He noted that his BP, which had also been erratic, seemed to stabilize once he started the Vyvanse.  She is seeing her PCP in 2 weeks to discuss this possibility.    Blood Pressure Goal:  130/80  Current Medications: chlorthalidone 25 mg qd (some days), doxazosin 1  mg qhs,   Previously tried:   lisinopril - eye swelling, amlodipine - lightheadedness, valsartan - angioedema, spironolactone - rash; beta blockers - felt terrible, minoxidil - facial swelling, hydralazine - joint pain, chlorthalidone - lightheadedness  Family Hx: Mother (HTN, MI, kidney failure); Father (leukemia, stroke); Son (overall healthy)   Social Hx:      Tobacco: never  Alcohol: rare  Diet: Pt typically does not eat breakfast. For lunch, she will have a salad, soup, or sandwich. When she does snack, it is on fruit. For dinner, she will typically eat fish, chicken, salad, and vegetables (typically fresh).    Exercise: 30 minutes on the treadmill, 10 minutes on weights, and 10 minutes working on her shoulder.  She is also very active doing housework and yardwork.   Home BP readings:  home monitor checked at last visit - Office 198/102;  home cuff 208/110  Last visit home average 142/80  Blood Pressure Monitor  09/07/2020: 24 hour ambulatory blood pressure monitor reviewed. Remained hypertensive throughout study. Overall mean 176/79, HR 69. Systolic dipped less than 1% with sleep. Max reading 201/98.  Accessory Clinical Findings    Lab Results  Component Value Date   CREATININE 0.95 11/08/2022   BUN 20 11/08/2022   NA 142 11/08/2022   K 3.6 11/08/2022   CL 99 11/08/2022   CO2 24 11/08/2022   Lab Results  Component Value Date   ALT 20 12/16/2021   AST 21 12/16/2021   ALKPHOS 72 12/16/2021   BILITOT 1.3 (H) 12/16/2021   Lab Results  Component Value Date   HGBA1C 5.6 12/14/2021    Screening for Secondary Hypertension: { Click here to document screening for secondary causes of HTN  :1}     01/22/2021    9:04 AM 01/22/2021    9:14 AM 06/27/2022    3:52 PM  Causes  Drugs/Herbals Screened  Screened     - Comments none identified  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  Limits caffeine.  No EtOH.  No NSAIDs  Renovascular HTN  Not Screened Screened  Sleep Apnea  Not Screened Screened     - Comments   no snoring  Hyperthyroidism Screened       - Comments TSH WNL 12/2020    Thyroid Disease Screened  Screened     - Comments TSH WNL 12/2020    Hyperaldosteronism  Not Screened Screened     - Comments   check renin/aldosterone  Pheochromocytoma Screened  Screened     - Comments plasm (05/2020) and urine (11/2020) metanephrines both WNL    Cushing's Syndrome Screened  Screened     - Comments cortison WNL (11/2020)    Hyperparathyroidism   Screened  Coarctation of the Aorta   Screened     - Comments   BP symmetric  Compliance Screened  Screened    Relevant Labs/Studies:    Latest Ref Rng & Units 11/08/2022    8:44 AM 02/04/2022    1:49 PM 12/16/2021    9:57 AM  Basic Labs  Sodium 134 - 144  mmol/L 142  140  140   Potassium 3.5 - 5.2 mmol/L 3.6  3.3  3.4   Creatinine 0.57 - 1.00 mg/dL 1.06  2.69  4.85        Latest Ref Rng & Units 12/16/2021    9:57 AM 08/16/2021    8:53 AM  Thyroid   TSH 0.450 - 4.500 uIU/mL 2.730  1.302  Latest Ref Rng & Units 06/28/2022    8:07 AM  Renin/Aldosterone   Aldosterone 0.0 - 30.0 ng/dL 76.8   Renin 0.881 - 1.031 ng/mL/hr 1.893   Aldos/Renin Ratio 0.0 - 30.0 9.1              06/17/2022    9:24 AM  Renovascular   Renal Artery [name] Completed Yes      Home Medications    Current Outpatient Medications  Medication Sig Dispense Refill   hydrochlorothiazide (HYDRODIURIL) 25 MG tablet Take 1 tablet (25 mg total) by mouth daily. 90 tablet 3   acetaminophen (TYLENOL) 325 MG tablet Take 650 mg by mouth as needed for moderate pain, mild pain or headache.     aspirin EC 81 MG tablet Take 81 mg by mouth daily. Swallow whole.     Cholecalciferol (VITAMIN D3) 50 MCG (2000 UT) capsule Take 4,000 Units by mouth daily. Take 2 tablets daily     D-MANNOSE PO Take 50 mg by mouth daily.     diphenhydrAMINE (BENADRYL) 25 MG tablet Take 0.5 tablets by mouth daily as needed (allergies).     doxazosin (CARDURA) 1 MG tablet Take 1 tablet (1 mg total) by mouth at bedtime. 90 tablet 1   famotidine (PEPCID) 20 MG tablet Take 1 tablet (20 mg total) by mouth 2 (two) times daily. 60 tablet 5   Garlic (GARLIQUE PO) Take 1 tablet by mouth daily.     hydrALAZINE (APRESOLINE) 10 MG tablet Take 1 tablet (10 mg total) by mouth in the morning and at bedtime. 60 tablet 3   L-THEANINE PO Take 1 tablet by mouth daily as needed (pain).     Lactase (LACTAID PO) Take 1-3 tablets by mouth 3 (three) times daily as needed (when consuming milk products).     Lactobacillus (PROBIOTIC ACIDOPHILUS PO) Take 1 tablet by mouth daily.     Melatonin 500 MCG TBDP Take 500 mcg by mouth daily as needed (sleep).     Menatetrenone (VITAMIN K2) 100 MCG TABS Take 100 mcg by mouth daily.      Multiple Vitamin (MULTIVITAMIN WITH MINERALS) TABS tablet Take 1 tablet by mouth daily.     OVER THE COUNTER MEDICATION Take 1,500 mg by mouth daily. Beet root     Probiotic Product (PROBIOTIC DAILY PO) Take 1 tablet by mouth daily.     Propylene Glycol (SYSTANE BALANCE OP) Place 1 drop into both eyes 3 (three) times daily as needed (dry eyes).     PSYLLIUM PO Take 1 capsule by mouth daily at 12 noon.     Simethicone (GAS-X PO) Take 1 tablet by mouth daily as needed (flatulance).     Turmeric Curcumin 500 MG CAPS Take 500 mg by mouth daily in the afternoon.     No current facility-administered medications for this visit.     Assessment & Plan   HYPERTENSION CONTROL Vitals:   01/17/23 0934 01/17/23 0946  BP: (!) 212/82 (!) 151/100    The patient's blood pressure is elevated above target today. {Click here if intervention needs to be changed Refresh Note :1}  In order to address the patient's elevated BP: A new medication was prescribed today.; A current anti-hypertensive medication was adjusted today.      Essential hypertension, benign Assessment: BP is uncontrolled in office BP 195/91 mmHg;  above the goal (<130/80). Unable to tolerate most medications for hypertension Does not know of any side effects to doxazosin at this time Would prefer to  get back on HCTZ as it had previously been a good medication for her Reiterated the importance of regular exercise and low salt diet   Plan:  Stop taking chlorthalidone Start taking hydrochlorothiazide 25 mg daily Continue taking doxazosin 1 mg qhs Patient to keep record of BP readings with heart rate and report to us at the next visit Patient to follow up with Dr. Cristal Deerhristopher on May 21 Labs ordered today:  none   Phillips HayKristin Tunis Gentle PharmD CPP [facility]  [address] [place], [place] [address] [phone]

## 2023-01-17 ENCOUNTER — Ambulatory Visit (HOSPITAL_BASED_OUTPATIENT_CLINIC_OR_DEPARTMENT_OTHER): Payer: Medicare Other | Admitting: Pharmacist Clinician (PhC)/ Clinical Pharmacy Specialist

## 2023-01-17 ENCOUNTER — Encounter (HOSPITAL_BASED_OUTPATIENT_CLINIC_OR_DEPARTMENT_OTHER): Payer: Self-pay | Admitting: Pharmacist Clinician (PhC)/ Clinical Pharmacy Specialist

## 2023-01-17 VITALS — BP 151/100 | HR 72

## 2023-01-17 DIAGNOSIS — I10 Essential (primary) hypertension: Secondary | ICD-10-CM

## 2023-01-17 MED ORDER — HYDROCHLOROTHIAZIDE 25 MG PO TABS: 25.0000 mg | ORAL_TABLET | Freq: Every day | ORAL | 3 refills | Status: DC

## 2023-01-17 NOTE — Patient Instructions (Signed)
Follow up appointment: with Dr. Cristal Deer in May  Take your BP meds as follows:   STOP CHLORTHALIDONE  START HYDROCHLOROTHIAZIDE 25 MG ONCE DAILY  CONTINUE WITH THE DOXAZOSIN FOR NOW  Check your blood pressure at home daily (if able) and keep record of the readings.  Hypertension "High blood pressure"  Hypertension is often called "The Silent Killer." It rarely causes symptoms until it is extremely  high or has done damage to other organs in the body. For this reason, you should have your  blood pressure checked regularly by your physician. We will check your blood pressure  every time you see a provider at one of our offices.   Your blood pressure reading consists of two numbers. Ideally, blood pressure should be  below 120/80. The first ("top") number is called the systolic pressure. It measures the  pressure in your arteries as your heart beats. The second ("bottom") number is called the diastolic pressure. It measures the pressure in your arteries as the heart relaxes between beats.  The benefits of getting your blood pressure under control are enormous. A 10-point  reduction in systolic blood pressure can reduce your risk of stroke by 27% and heart failure by 28%  Your blood pressure goal is < 130/80  To check your pressure at home you will need to:  1. Sit up in a chair, with feet flat on the floor and back supported. Do not cross your ankles or legs. 2. Rest your left arm so that the cuff is about heart level. If the cuff goes on your upper arm,  then just relax the arm on the table, arm of the chair or your lap. If you have a wrist cuff, we  suggest relaxing your wrist against your chest (think of it as Pledging the Flag with the  wrong arm).  3. Place the cuff snugly around your arm, about 1 inch above the crook of your elbow. The  cords should be inside the groove of your elbow.  4. Sit quietly, with the cuff in place, for about 5 minutes. After that 5 minutes press  the power  button to start a reading. 5. Do not talk or move while the reading is taking place.  6. Record your readings on a sheet of paper. Although most cuffs have a memory, it is often  easier to see a pattern developing when the numbers are all in front of you.  7. You can repeat the reading after 1-3 minutes if it is recommended  Make sure your bladder is empty and you have not had caffeine or tobacco within the last 30 min  Always bring your blood pressure log with you to your appointments. If you have not brought your monitor in to be double checked for accuracy, please bring it to your next appointment.  You can find a list of quality blood pressure cuffs at validatebp.org

## 2023-01-17 NOTE — Assessment & Plan Note (Signed)
Assessment: BP is uncontrolled in office BP 195/91 mmHg;  above the goal (<130/80). Unable to tolerate most medications for hypertension Does not know of any side effects to doxazosin at this time Would prefer to get back on HCTZ as it had previously been a good medication for her Reiterated the importance of regular exercise and low salt diet   Plan:  Stop taking chlorthalidone Start taking hydrochlorothiazide 25 mg daily Continue taking doxazosin 1 mg qhs Patient to keep record of BP readings with heart rate and report to [name] at the next visit Patient to follow up with Dr. Cristal Deer on May 21 Labs ordered today:  none

## 2023-01-30 DIAGNOSIS — E7800 Pure hypercholesterolemia, unspecified: Secondary | ICD-10-CM | POA: Diagnosis not present

## 2023-01-30 DIAGNOSIS — N1831 Chronic kidney disease, stage 3a: Secondary | ICD-10-CM | POA: Diagnosis not present

## 2023-01-30 DIAGNOSIS — M8589 Other specified disorders of bone density and structure, multiple sites: Secondary | ICD-10-CM | POA: Diagnosis not present

## 2023-01-30 DIAGNOSIS — M3500 Sicca syndrome, unspecified: Secondary | ICD-10-CM | POA: Diagnosis not present

## 2023-01-30 DIAGNOSIS — Z0000 Encounter for general adult medical examination without abnormal findings: Secondary | ICD-10-CM | POA: Diagnosis not present

## 2023-01-30 DIAGNOSIS — I10 Essential (primary) hypertension: Secondary | ICD-10-CM | POA: Diagnosis not present

## 2023-02-06 DIAGNOSIS — R946 Abnormal results of thyroid function studies: Secondary | ICD-10-CM | POA: Diagnosis not present

## 2023-02-10 DIAGNOSIS — S46011D Strain of muscle(s) and tendon(s) of the rotator cuff of right shoulder, subsequent encounter: Secondary | ICD-10-CM | POA: Diagnosis not present

## 2023-02-27 NOTE — Progress Notes (Signed)
Cardiology Office Note:    Date:  02/28/2023   ID:  Regina Marsh, DOB 12/15/1945, MRN [id number]  PCP:  Shon Hale, MD  Cardiologist:  Jodelle Red, MD  Referring MD: Shon Hale, *   CC: follow up  History of Present Illness:    Regina Marsh is a 77 y.o. female with a hx of Sjogren's, hypertension, hyperlipidemia, and GERD, here for follow up. I met her 06/23/20 as a new consult at the request of Shon Hale, * for the evaluation and management of resistant hypertension.  Cardiac history:  metanephrines, renin, aldosterone have been WNL. Long history of drug intolerances, see summary below. Following with advanced hypertension clinic as well. Pain, stress have significant impact on blood pressures. Has discussed renal denervation.   Today, she is accompanied by her husband. On last Thursday, she had an episode of being unable to walk when she stood up. It was difficult for her to lift her legs due to the pain in her legs. She also noticed a grinding noise in her neck, and had radiating pain distally to her left arm. She believes she may have a pinched nerve. She also stopped her antihypertensives that day aside from the HCTZ which she has faithfully maintained. By Friday her walking began to improve, and she is walking significantly better today. She endorses baseline muscle strength of her hands; no weakness.  She presents a blood pressure log showing that her blood pressure was 129/72 on 11/28/22. She noticed increasing blood pressures since then with the medication changes. We reviewed the course of her antihypertensive changes since following with the Hypertension clinic. In clinic today, her blood pressure is elevated to 242/112 (improved to 218/94 on manual recheck). Her home blood pressures have been running high as well, so she has fallen out of the habit of monitoring at home lately. Prior to the recent uncontrolled blood pressures, she had  been exercising on the treadmill at least 30 minutes a day.  Recently she found out she has hypothyroidism, and has been started on levothyroxine a little over 2 weeks ago. Since starting this medication her blood pressures have increased.  Her son was diagnosed with ADHD and treated with Vyvanse. Reportedly his blood pressure stabilized on Vyvanse. She would like to know if this would potentially help her as well.   She continues to have intermittent migraine headaches, as well as pains localized to the top of her right head, especially prior to rainy weather.  She denies any palpitations, chest pain, shortness of breath, or peripheral edema. No lightheadedness, syncope, orthopnea, or PND.   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   aspirin EC 81 MG tablet Take 81 mg by mouth daily. Swallow whole.   Cholecalciferol (VITAMIN D3) 50 MCG (2000 UT) capsule Take 4,000 Units by mouth daily. Take 2 tablets daily   D-MANNOSE PO Take 50 mg by mouth daily.    diphenhydrAMINE (BENADRYL) 25 MG tablet Take 0.5 tablets by mouth daily as needed (allergies).   famotidine (PEPCID) 20 MG tablet Take 1 tablet (20 mg total) by mouth 2 (two) times daily.   Garlic (GARLIQUE PO) Take 1 tablet by mouth daily.   hydrochlorothiazide (HYDRODIURIL) 25 MG tablet Take 1 tablet (25 mg total) by mouth daily.   L-THEANINE PO Take 1 tablet by mouth daily as needed (pain).   Lactase (LACTAID PO) Take 1-3 tablets by mouth 3 (three) times daily as needed (when consuming milk products).   Lactobacillus (PROBIOTIC ACIDOPHILUS PO) Take 1 tablet by mouth daily.   levothyroxine (SYNTHROID) 25 MCG tablet Take 25 mcg by mouth daily before breakfast.   Melatonin 500 MCG TBDP Take 500 mcg by mouth daily as needed (sleep).   Menatetrenone (VITAMIN K2) 100 MCG TABS Take 100 mcg by mouth daily.   Multiple Vitamin (MULTIVITAMIN WITH MINERALS) TABS tablet Take 1 tablet by mouth daily.   OVER THE COUNTER MEDICATION Take 1,500 mg by mouth daily. Beet root   Probiotic Product (PROBIOTIC DAILY PO) Take 1 tablet by mouth daily.   Propylene Glycol (SYSTANE BALANCE OP) Place 1 drop into both eyes 3 (three) times daily as needed (dry eyes).   PSYLLIUM PO Take 1 capsule by mouth daily at 12 noon.   Simethicone (GAS-X PO) Take 1 tablet by mouth daily as needed (flatulance).   Turmeric Curcumin 500 MG CAPS Take 500 mg by mouth daily in the afternoon.   No current facility-administered medications on file prior to visit.     Allergies:   Aleve [naproxen], Allegra [fexofenadine], Amlodipine, Cetirizine hcl, Chlorthalidone, Hydralazine, Lactose intolerance (gi), Lisinopril, Loratadine, Pravastatin, Simvastatin, Sulfa antibiotics, Valsartan, African mango [irvingia gabonensis], Penicillins, and Spironolactone   Social History   Tobacco Use   Smoking status: Never   Smokeless tobacco: Never  Vaping Use   Vaping Use: Never used  Substance Use Topics   Alcohol use: Yes    Comment: rare   Drug  use: Never    Family History: No family history of premature CAD  ROS:   Please see the history of present illness.   (+) Neck pain with LUE pain Additional pertinent ROS otherwise unremarkable.   EKGs/Labs/Other Studies Reviewed:    The following studies were reviewed today:  Bilateral Renal Artery Doppler  06/17/2022: Summary:  Renal:    Right: Normal size right kidney. Abnormal right Resistive Index.         Normal cortical thickness of right kidney. RRV flow present.         No evidence of right renal artery stenosis.  Left:  Normal size of left kidney. Abnormal left Resisitve Index.         Normal cortical thickness of the left kidney. LRV flow         present. No evidence of left renal artery stenosis.   Echo 02/04/2022: IMPRESSIONS   1. Left ventricular ejection fraction, by estimation, is 60 to 65%.  The  left ventricle has normal function. The left ventricle has no regional  wall motion abnormalities. There is mild concentric left ventricular  hypertrophy. Left ventricular diastolic  parameters are consistent with Grade I diastolic dysfunction (impaired  relaxation). Elevated left ventricular end-diastolic pressure.   2. Right ventricular systolic function is normal. The right ventricular  size is normal. There is normal pulmonary artery systolic pressure.   3. Left atrial size was mildly dilated.   4. The mitral valve is normal in structure. Trivial mitral valve  regurgitation. No evidence of mitral stenosis.   5. The aortic valve is tricuspid. Aortic valve regurgitation is not  visualized. No aortic stenosis is present.   6. The inferior vena cava is normal in size with greater than 50%  respiratory variability, suggesting right atrial pressure of 3 mmHg.   FINDINGS   Left Ventricle: Left ventricular ejection fraction, by estimation, is 60  to 65%. The left ventricle has normal function. The left ventricle has no  regional wall motion abnormalities. The left  ventricular internal cavity  size was normal in size. There is   mild concentric left ventricular hypertrophy. Left ventricular diastolic  parameters are consistent with Grade I diastolic dysfunction (impaired  relaxation). Elevated left ventricular end-diastolic pressure.   Right Ventricle: The right ventricular size is normal. No increase in  right ventricular wall thickness. Right ventricular systolic function is  normal. There is normal pulmonary artery systolic pressure. The tricuspid  regurgitant velocity is 2.16 m/s, and   with an assumed right atrial pressure of 3 mmHg, the estimated right  ventricular systolic pressure is 21.7 mmHg.   Left Atrium: Left atrial size was mildly dilated.   Right Atrium: Right atrial size was normal in size.   Pericardium: There is no evidence of pericardial effusion.   Mitral Valve: The mitral valve is normal in structure. Trivial mitral  valve regurgitation. No evidence of mitral valve stenosis.   Tricuspid Valve: The tricuspid valve is normal in structure. Tricuspid  valve regurgitation is trivial. No evidence of tricuspid stenosis.   Aortic Valve: The aortic valve is tricuspid. Aortic valve regurgitation is  not visualized. No aortic stenosis is present.   Pulmonic Valve: The pulmonic valve was normal in structure. Pulmonic valve  regurgitation is trivial. No evidence of pulmonic stenosis.   Aorta: The aortic root is normal in size and structure.   Venous: The inferior vena cava is normal in size with greater than 50%  respiratory variability, suggesting right atrial pressure of 3 mmHg.   IAS/Shunts: No atrial level shunt detected by color flow Doppler.   CTA Head/Neck 08/16/2021: FINDINGS: CT HEAD   Brain: There is no acute intracranial hemorrhage, mass effect, or edema. Gray-white differentiation is preserved. There is no extra-axial fluid collection. Ventricles and sulci are within normal limits in size configuration. Patchy  low-density in the supratentorial white matter is nonspecific but may reflect chronic microvascular ischemic changes. 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   Vascular: There is atherosclerotic calcification at the skull base.   Skull: Calvarium is unremarkable.   Sinuses/Orbits: No acute finding.   Other: None.   Review of the MIP images confirms the above findings   CTA NECK   Aortic arch: Great vessel origins are patent.   Right carotid system: Patent. Minimal calcified plaque along the proximal internal carotid without stenosis.   Left carotid system: Patent. Eccentric noncalcified plaque along the common carotid  with less than 50% stenosis. Noncalcified plaque along the proximal internal carotid with less than 50% stenosis.   Vertebral arteries: Patent and codominant.  No stenosis.   Skeleton: Degenerative changes of the cervical spine, greatest at C5-C6.   Other neck: No acute abnormality. Fatty atrophy or resected left submandibular gland.   Upper chest: No apical lung mass.   Review of the MIP images confirms the above findings   CTA HEAD   Suboptimal arterial evaluation due to venous enhancement.   Anterior circulation: Intracranial internal carotid arteries are patent. There is noncalcified plaque along the petrous portion and inferior genu with mild stenosis. Calcified plaque along the cavernous segment with mild stenosis.   Occlusion of the right M1 MCA. There is reconstitution at the bifurcation. Left middle cerebral artery is patent with what appears to be an early bifurcation probable superimposed stenosis.   Anterior cerebral arteries are patent. Anterior communicating artery is present. Suspect stenosis of the proximal left A1 ACA.   Posterior circulation: Intracranial vertebral arteries are patent. Moderate stenosis on the right. Moderate to marked stenosis on the left just beyond PICA  origin. There is likely stenosis at the left PICA origin. Basilar artery is patent with noncalcified plaque. Posterior cerebral arteries are poorly visualized and likely diffusely stenotic and irregular.   Venous sinuses: Patent as allowed by contrast bolus timing.   Review of the MIP images confirms the above findings   IMPRESSION: There is no acute intracranial hemorrhage or evidence of acute infarction. ASPECT score is 10.   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   Intracranial atherosclerosis. Moderate stenosis right vertebral artery and moderate to marked stenosis left vertebral artery just beyond PICA origin. There is stenosis at the left PICA origin. The posterior cerebral arteries are poorly visualized and likely diffusely irregular and stenotic. Probable early left MCA bifurcation with superimposed stenosis. Stenosis of proximal left A1 ACA.   No hemodynamically significant stenosis in the neck. Noncalcified plaque at the left ICA origin with less than 50% stenosis.   Initial results were called by telephone at the time of interpretation on 08/16/2021 at 10:32 am to provider [facility] , who verbally acknowledged these results.  Calcium score 05/07/21 Coronary calcium score of 27.7. This was 42nd percentile for age-, race-, and sex-matched controls.  Monitor 09/21/20 14 days of data recorded on Zio monitor. Patient had a min HR of 45 bpm, max HR of 184 bpm, and avg HR of 68 bpm. Predominant underlying rhythm was Sinus Rhythm. No VT, atrial fibrillation, high degree block, or pauses noted. 4 Supraventricular Tachycardia runs occurred, the run with the fastest interval lasting 6 beats with a max rate of 184 bpm, the longest lasting 11 beats with an avg rate of 109 bpm. Isolated atrial and ventricular ectopy was rare (<1%). There were 19 triggered events. These are all sinus rhythm based, 3 with PAC and 1 with  PVC.  Ambulatory 24 hour BP monitoring 09/21/20 24 hour ambulatory blood pressure monitor reviewed. Remained hypertensive throughout study. Overall mean 176/79, HR 69. Systolic dipped less than 1% with sleep. Max reading 201/98.    EKG:  EKG is personally reviewed.   02/28/2023:  NSR at 66 bpm, nonspecific st-t pattern 08/25/2022:  not ordered 02/04/2022 (Dr. Everardo Pacific):  NSR at 75 bpm. 08/16/2021 (ED): Sinus rhythm, rate 72 bpm, Borderline repolarization abnormality, When compared to prior, similar appearance. No STEMI. 06/08/2021 Sinus rhythm with 1st degree AV block, 86 bpm  06/23/20: NSR at 83 bpm with poor r wave progression  Recent Labs: 11/08/2022: BUN 20; Creatinine, Ser 0.95; Potassium 3.6; Sodium 142   Recent Lipid Panel    Component Value Date/Time   CHOL 283 (H) 12/16/2021 0957   TRIG 211 (H) 12/16/2021 0957   HDL 57 12/16/2021 0957   CHOLHDL 5.0 (H) 12/16/2021 0957   LDLCALC 186 (H) 12/16/2021 0957    Physical Exam:    VS:  BP (!) 218/94 (BP Location: Right Arm, Patient Position: Sitting, Cuff Size: Normal)   Pulse 66   Ht 5\' 2"  (1.575 m)   Wt 172 lb 11.2 oz (78.3 kg)   BMI 31.59 kg/m     Wt Readings from Last 3 Encounters:  02/28/23 172 lb 11.2 oz (78.3 kg)  12/01/22 168 lb 6.4 oz (76.4 kg)  11/01/22 169 lb 4.8 oz (76.8 kg)    GEN: Well nourished, well developed in no acute distress HEENT: Normal, moist mucous membranes NECK: No JVD CARDIAC: regular rhythm, normal S1 and S2, no rubs or gallops. No murmur. VASCULAR: Radial and DP pulses 2+ bilaterally. No carotid bruits RESPIRATORY:  Clear to auscultation without rales, wheezing or rhonchi  ABDOMEN: Soft, non-tender, non-distended MUSCULOSKELETAL:  Ambulates independently SKIN: Warm and dry, no edema NEUROLOGIC:  Alert and oriented x 3. No focal neuro deficits noted. PSYCHIATRIC:  Normal affect    ASSESSMENT:    1. Resistant hypertension   2. Medication intolerance   3. Mixed hyperlipidemia   4. Coronary  artery calcification seen on CT scan     PLAN:    Hypertension, labile -has tolerated only HCTZ recently. Willing to retrial doxazosin -discussed clonidine. Noted increased risk of dry eye/dry mouth, GI, lightheadedness, rebound hypertension with this. -appreciate assistance from advanced hypertension clinic  Intolerances: Spironolactone: rash beta blockers: felt terrible (remotely) Amlodipine: lightheadedness Minoxidil: face/tongue swelling Lisinopril: lightheadedness, stomach pain Valsartan: lightheadedness, nausea, stomach pain Chlorthalidone: lightheadedness, nausea Hydralazine: joint pain and fatigue  -lab workup for secondary causes unremarkable.  Mixed hyperlipidemia Coronary calcification -She has not tolerated statins. Discussed non statin alternatives, she does not want to try any medications. We did discuss long term risk of heart attack and stroke and how we use medications to optimize this. She feels she is at her limit and cannot do anything more in terms of medications right now.  -calcium score 05/07/21 showed Ca score=27.7  Cardiac risk counseling and prevention recommendations: -we have discussed diet and exercise recommendations -recommend avoidance of tobacco products. Avoid excess alcohol.  Plan for follow up: 2 weeks or sooner as needed.  Jodelle Red, MD, PhD, [facility]  [facility]    Medication Adjustments/Labs and Tests Ordered: Current medicines are reviewed at length with the patient today.  Concerns regarding medicines are outlined above.   Orders Placed This Encounter  Procedures   EKG 12-Lead   Meds ordered this encounter  Medications   doxazosin (CARDURA) 1 MG tablet    Sig: Take 1 tablet (1 mg total) by mouth at bedtime.    Dispense:  90 tablet    Refill:  3   Patient Instructions  Please continue the hydrochlorothiazide.  We are going to restart the doxazosin at bedtime. I want you to take the 1 mg dose at  bedtime for 5 days. If your blood pressure is consistently more than 160 on the top number after that, I want you to increase to 2 mg doxazosin at bedtime. Please check your blood pressure on these doses  and bring to your appt.  Follow up with Dr Cristal Deer 03/15/2023 at 11:40 am     I,Mathew Stumpf,acting as a scribe for Jodelle Red, MD.,have documented all relevant documentation on the behalf of Jodelle Red, MD,as directed by  Jodelle Red, MD while in the presence of Jodelle Red, MD.  I, Jodelle Red, MD, have reviewed all documentation for this visit. The documentation on 02/28/23 for the exam, diagnosis, procedures, and orders are all accurate and complete.   Signed, Jodelle Red, MD PhD 02/28/2023   [facility]

## 2023-02-28 ENCOUNTER — Encounter (HOSPITAL_BASED_OUTPATIENT_CLINIC_OR_DEPARTMENT_OTHER): Payer: Self-pay | Admitting: Cardiology

## 2023-02-28 ENCOUNTER — Ambulatory Visit (HOSPITAL_BASED_OUTPATIENT_CLINIC_OR_DEPARTMENT_OTHER): Payer: Medicare Other | Admitting: Cardiology

## 2023-02-28 VITALS — BP 218/94 | HR 66 | Ht 62.0 in | Wt 172.7 lb

## 2023-02-28 DIAGNOSIS — I2510 Atherosclerotic heart disease of native coronary artery without angina pectoris: Secondary | ICD-10-CM | POA: Diagnosis not present

## 2023-02-28 DIAGNOSIS — Z789 Other specified health status: Secondary | ICD-10-CM | POA: Diagnosis not present

## 2023-02-28 DIAGNOSIS — I1A0 Resistant hypertension: Secondary | ICD-10-CM

## 2023-02-28 DIAGNOSIS — E782 Mixed hyperlipidemia: Secondary | ICD-10-CM | POA: Diagnosis not present

## 2023-02-28 MED ORDER — DOXAZOSIN MESYLATE 1 MG PO TABS: 1.0000 mg | ORAL_TABLET | Freq: Every day | ORAL | 3 refills | Status: DC

## 2023-02-28 NOTE — Patient Instructions (Addendum)
Please continue the hydrochlorothiazide.  We are going to restart the doxazosin at bedtime. I want you to take the 1 mg dose at bedtime for 5 days. If your blood pressure is consistently more than 160 on the top number after that, I want you to increase to 2 mg doxazosin at bedtime. Please check your blood pressure on these doses and bring to your appt.  Follow up with Dr Cristal Deer 03/15/2023 at 11:40 am

## 2023-03-14 DIAGNOSIS — M5412 Radiculopathy, cervical region: Secondary | ICD-10-CM | POA: Diagnosis not present

## 2023-03-14 DIAGNOSIS — M542 Cervicalgia: Secondary | ICD-10-CM | POA: Diagnosis not present

## 2023-03-14 DIAGNOSIS — M4802 Spinal stenosis, cervical region: Secondary | ICD-10-CM | POA: Diagnosis not present

## 2023-03-15 ENCOUNTER — Encounter (HOSPITAL_BASED_OUTPATIENT_CLINIC_OR_DEPARTMENT_OTHER): Payer: Self-pay | Admitting: Cardiology

## 2023-03-15 ENCOUNTER — Ambulatory Visit (HOSPITAL_BASED_OUTPATIENT_CLINIC_OR_DEPARTMENT_OTHER): Payer: Medicare Other | Admitting: Cardiology

## 2023-03-15 VITALS — BP 208/86 | HR 72 | Ht 62.0 in | Wt 173.5 lb

## 2023-03-15 DIAGNOSIS — E782 Mixed hyperlipidemia: Secondary | ICD-10-CM | POA: Diagnosis not present

## 2023-03-15 DIAGNOSIS — I2510 Atherosclerotic heart disease of native coronary artery without angina pectoris: Secondary | ICD-10-CM

## 2023-03-15 DIAGNOSIS — Z789 Other specified health status: Secondary | ICD-10-CM | POA: Diagnosis not present

## 2023-03-15 DIAGNOSIS — I1A0 Resistant hypertension: Secondary | ICD-10-CM

## 2023-03-15 NOTE — Progress Notes (Signed)
Cardiology Office Note:    Date:  03/15/2023   ID:  Regina Marsh, DOB [birth year], MRN [id number]  PCP:  Shon Hale, MD  Cardiologist:  Jodelle Red, MD  Referring MD: Shon Hale, *   CC: follow up  History of Present Illness:    Regina Marsh is a 77 y.o. female with a hx of Sjogren's, hypertension, hyperlipidemia, and GERD, here for follow up. I met her 06/23/20 as a new consult at the request of Shon Hale, * for the evaluation and management of resistant hypertension.  Cardiac history:  metanephrines that are within normal limits (metanephrine 48, normetanephrine 141). ER note from 03/27/20. Noted to have elevated blood pressure after she had nasal packing for a nosebleed. No associated symptoms. BP noted as 214/91 at that time. Long history of drug intolerances, see summary below.  At her visit in 05/2022 her BP at home ranged 119-190 systolic. 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 Stress or pain did not seem to correlate with her blood pressure spikes. She stopped amlodipine (was a retrial) due to lightheadedness. Was on HCTZ. She complained of a focal (circular) area on the top of her head that causes intermittent strong/sharp pain, more external than internal. No clear triggers. About the size of a quarter. We discussed hydralazine and clonidine. She had declined previously but was amenable to using a PRN rescue approach. Prescribed hydralazine 10 mg q6 hours PRN for systolic BP >160. She was [facility] for further evaluation.  She saw Dr. Duke Salvia in the HTN clinic on 06/27/2022 and her blood pressure was 252/98. She reported at home BP ranging 108-158. It was felt she might benefit from treatment of anxiety and better pain control. She was going to start taking Tylenol before her physical therapy sessions. Labs for renin and aldosterone levels were ordered and within normal limits.   I saw her in follow-up  08/2022 and she reported a recent episode of "a funny feeling" similar to lightheadedness and associated with a blood pressure of 113 systolic (was 149 earlier that day). The sensation lasted for a while and then subsided with normalization of her blood pressure. If her blood pressure is higher than 160 systolic, she will first try yoga and breathing exercises. This usually helps improve her blood pressure by 30 points, so she doesn't always need to take her prn antihypertensives. Prior to a blood pressure spike she will often feel a sensation similar to a hot flash. On some days her right shoulder pain continues to raise her blood pressure. In the office her blood pressure was 198/110 (186/94 on recheck). She also attributed this to receiving her COVID booster vaccination earlier. We also discussed starting non-statin alternatives, but she felt like she was at her limit in terms of medications and deferred for now.   She followed up in the HTN clinic 08/2022 and was started on doxazosin 1 mg nightly. Blood pressures remained uncontrolled 10/2022 and her HCTZ was switched to 25 mg chlorthalidone daily. Renal denervation was discussed which she would consider for the future. She saw our pharmacist 11/2022 and was started on 10 mg hydralazine BID. However, she reported headaches, gastric issues, arthralgias, and rising blood pressures as high as 205/105. She self-discontinued the hydralazine and after 2 days her symptoms improved. She also stopped chlorthalidone due to lightheadedness and nausea. As of 01/2023 she was only on doxazosin. She was started on 25 mg HCTZ as this reportedly worked for  her in the past.   At her last visit, she felt that she had a pinched nerve given radiating neck pain with a grinding noise. She had stopped her antihypertensives aside from the HCTZ. She also noted an episode of bilateral leg pain that prevented her from walking when she stood up. Her walking did gradually improve over a few  days. She presented a blood pressure log showing 129/72 on 11/28/22. She noticed increasing blood pressures since then with the medication changes. We reviewed the course of her antihypertensive changes since following with the Hypertension clinic. In the office her blood pressure was elevated to 242/112 (improved to 218/94 on manual recheck). Also she had recently been diagnosed with hypothyroidism, and had been started on levothyroxine a little over 2 weeks prior. Since starting this medication her blood pressures increased.  Today, she confirms that she has started back on doxazosin. However, over memorial day weekend her blood pressure was consistently near 169/106 with a HR of 152 bpm, lasting for 36 hours. She then stopped taking her thyroid medication as she read this could affect her blood pressure. Her blood pressure improved to readings like 137/66 but remained elevated. Then she began taking the double doses of doxazosin but noticed that her blood pressure was still increasing. Recently she has seen some blood pressures of 117 and 108 systolic at the lowest, but are typically elevated. In clinic today her blood pressure is elevated to 206/76. On manual recheck her BP is 208/86.  Also she notes that her neck/shoulder pain is not as severe as it was a couple weeks ago, but it has been more constant. Occasionally she experiences shooting pains, and her right shoulder appears more swollen compared to the left. She notes that she will have an upcoming MRI and has been recommended for physical therapy. She is also suffering from bilateral leg pain at night.  Additionally she has been feeling very lightheaded, to the point that she is fearful of driving. Her lightheadedness seems to be improving slightly since stopping the thyroid medication.  She denies any palpitations, chest pain, shortness of breath, headaches, syncope, orthopnea, or PND.  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   aspirin EC 81 MG tablet Take 81 mg by mouth daily. Swallow whole.   Cholecalciferol (VITAMIN D3) 50 MCG (2000 UT) capsule Take 4,000 Units by mouth daily. Take 2 tablets daily   D-MANNOSE PO  Take 50 mg by mouth daily.   diphenhydrAMINE (BENADRYL) 25 MG tablet Take 0.5 tablets by mouth daily as needed (allergies).   doxazosin (CARDURA) 1 MG tablet Take 1 tablet (1 mg total) by mouth at bedtime.   famotidine (PEPCID) 20 MG tablet Take 1 tablet (20 mg total) by mouth 2 (two) times daily.   Garlic (GARLIQUE PO) Take 1 tablet by mouth daily.   hydrochlorothiazide (HYDRODIURIL) 25 MG tablet Take 1 tablet (25 mg total) by mouth daily.   L-THEANINE PO Take 1 tablet by mouth daily as needed (pain).   Lactase (LACTAID PO) Take 1-3 tablets by mouth 3 (three) times daily as needed (when consuming milk products).   Lactobacillus (PROBIOTIC ACIDOPHILUS PO) Take 1 tablet by mouth daily.   Melatonin 500 MCG TBDP Take 500 mcg by mouth daily as needed (sleep).   Menatetrenone  (VITAMIN K2) 100 MCG TABS Take 100 mcg by mouth daily.   Multiple Vitamin (MULTIVITAMIN WITH MINERALS) TABS tablet Take 1 tablet by mouth daily.   OVER THE COUNTER MEDICATION Take 1,500 mg by mouth daily. Beet root   Probiotic Product (PROBIOTIC DAILY PO) Take 1 tablet by mouth daily.   Propylene Glycol (SYSTANE BALANCE OP) Place 1 drop into both eyes 3 (three) times daily as needed (dry eyes).   PSYLLIUM PO Take 1 capsule by mouth daily at 12 noon.   Simethicone (GAS-X PO) Take 1 tablet by mouth daily as needed (flatulance).   Turmeric Curcumin 500 MG CAPS Take 500 mg by mouth daily in the afternoon.   No current facility-administered medications on file prior to visit.     Allergies:   Aleve [naproxen], Allegra [fexofenadine], Amlodipine, Cetirizine hcl, Chlorthalidone, Hydralazine, Lactose intolerance (gi), Lisinopril, Loratadine, Pravastatin, Simvastatin, Sulfa antibiotics, Valsartan, African mango [irvingia gabonensis], Penicillins, and Spironolactone   Social History   Tobacco Use   Smoking status: Never   Smokeless tobacco: Never  Vaping Use   Vaping Use: Never used  Substance Use Topics   Alcohol use: Yes    Comment: rare   Drug use: Never    Family History: No family history of premature CAD  ROS:   Please see the history of present illness.   (+) Neck/Shoulder pain (+) Swelling of right shoulder (+) Bilateral leg pain (+) Lightheadedness Additional pertinent ROS otherwise unremarkable.   EKGs/Labs/Other Studies Reviewed:    The following studies were reviewed today:  Bilateral Renal Artery Doppler  06/17/2022: Summary:  Renal:    Right: Normal size right kidney. Abnormal right Resistive Index.         Normal cortical thickness of right kidney. RRV flow present.         No evidence of right renal artery stenosis.  Left:  Normal size of left kidney. Abnormal left Resisitve Index.         Normal cortical thickness of the left kidney. LRV flow         present.  No evidence of left renal artery stenosis.   Echo 02/04/2022: IMPRESSIONS   1. Left ventricular ejection fraction, by estimation, is 60 to 65%. The  left ventricle has normal function. The left ventricle has no regional  wall motion abnormalities. There is mild concentric left ventricular  hypertrophy. Left ventricular diastolic  parameters are consistent with Grade I diastolic dysfunction (impaired  relaxation). Elevated left ventricular end-diastolic pressure.   2. Right ventricular systolic function is normal. The right ventricular  size is normal. There is normal pulmonary artery systolic pressure.  3. Left atrial size was mildly dilated.   4. The mitral valve is normal in structure. Trivial mitral valve  regurgitation. No evidence of mitral stenosis.   5. The aortic valve is tricuspid. Aortic valve regurgitation is not  visualized. No aortic stenosis is present.   6. The inferior vena cava is normal in size with greater than 50%  respiratory variability, suggesting right atrial pressure of 3 mmHg.   FINDINGS   Left Ventricle: Left ventricular ejection fraction, by estimation, is 60  to 65%. The left ventricle has normal function. The left ventricle has no  regional wall motion abnormalities. The left ventricular internal cavity  size was normal in size. There is   mild concentric left ventricular hypertrophy. Left ventricular diastolic  parameters are consistent with Grade I diastolic dysfunction (impaired  relaxation). Elevated left ventricular end-diastolic pressure.   Right Ventricle: The right ventricular size is normal. No increase in  right ventricular wall thickness. Right ventricular systolic function is  normal. There is normal pulmonary artery systolic pressure. The tricuspid  regurgitant velocity is 2.16 m/s, and   with an assumed right atrial pressure of 3 mmHg, the estimated right  ventricular systolic pressure is 21.7 mmHg.   Left Atrium: Left atrial size was  mildly dilated.   Right Atrium: Right atrial size was normal in size.   Pericardium: There is no evidence of pericardial effusion.   Mitral Valve: The mitral valve is normal in structure. Trivial mitral  valve regurgitation. No evidence of mitral valve stenosis.   Tricuspid Valve: The tricuspid valve is normal in structure. Tricuspid  valve regurgitation is trivial. No evidence of tricuspid stenosis.   Aortic Valve: The aortic valve is tricuspid. Aortic valve regurgitation is  not visualized. No aortic stenosis is present.   Pulmonic Valve: The pulmonic valve was normal in structure. Pulmonic valve  regurgitation is trivial. No evidence of pulmonic stenosis.   Aorta: The aortic root is normal in size and structure.   Venous: The inferior vena cava is normal in size with greater than 50%  respiratory variability, suggesting right atrial pressure of 3 mmHg.   IAS/Shunts: No atrial level shunt detected by color flow Doppler.   CTA Head/Neck 08/16/2021: FINDINGS: CT HEAD   Brain: There is no acute intracranial hemorrhage, mass effect, or edema. Gray-white differentiation is preserved. There is no extra-axial fluid collection. Ventricles and sulci are within normal limits in size configuration. Patchy low-density in the supratentorial white matter is nonspecific but may reflect chronic microvascular ischemic changes. 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   Vascular: There is atherosclerotic calcification at the skull base.   Skull: Calvarium is unremarkable.   Sinuses/Orbits: No acute finding.   Other: None.   Review of the MIP images confirms the above findings   CTA NECK   Aortic arch: Great vessel origins are patent.   Right carotid system: Patent. Minimal calcified plaque along the proximal internal carotid without stenosis.   Left carotid system: Patent. Eccentric noncalcified plaque along the common carotid  with less than 50% stenosis. Noncalcified plaque along the proximal internal carotid with less than 50% stenosis.   Vertebral arteries: Patent and codominant.  No stenosis.   Skeleton: Degenerative changes of the cervical spine, greatest at C5-C6.   Other neck: No acute abnormality. Fatty atrophy or resected left submandibular gland.   Upper chest: No apical lung mass.   Review of the MIP images confirms the  above findings   CTA HEAD   Suboptimal arterial evaluation due to venous enhancement.   Anterior circulation: Intracranial internal carotid arteries are patent. There is noncalcified plaque along the petrous portion and inferior genu with mild stenosis. Calcified plaque along the cavernous segment with mild stenosis.   Occlusion of the right M1 MCA. There is reconstitution at the bifurcation. Left middle cerebral artery is patent with what appears to be an early bifurcation probable superimposed stenosis.   Anterior cerebral arteries are patent. Anterior communicating artery is present. Suspect stenosis of the proximal left A1 ACA.   Posterior circulation: Intracranial vertebral arteries are patent. Moderate stenosis on the right. Moderate to marked stenosis on the left just beyond PICA origin. There is likely stenosis at the left PICA origin. Basilar artery is patent with noncalcified plaque. Posterior cerebral arteries are poorly visualized and likely diffusely stenotic and irregular.   Venous sinuses: Patent as allowed by contrast bolus timing.   Review of the MIP images confirms the above findings   IMPRESSION: There is no acute intracranial hemorrhage or evidence of acute infarction. ASPECT score is 10.   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   Intracranial atherosclerosis. Moderate stenosis right vertebral artery and moderate to marked stenosis left vertebral artery just beyond PICA origin. There is  stenosis at the left PICA origin. The posterior cerebral arteries are poorly visualized and likely diffusely irregular and stenotic. Probable early left MCA bifurcation with superimposed stenosis. Stenosis of proximal left A1 ACA.   No hemodynamically significant stenosis in the neck. Noncalcified plaque at the left ICA origin with less than 50% stenosis.   Initial results were called by telephone at the time of interpretation on 08/16/2021 at 10:32 am to provider [facility] , who verbally acknowledged these results.  Calcium score 05/07/21 Coronary calcium score of 27.7. This was 42nd percentile for age-, race-, and sex-matched controls.  Monitor 09/21/20 14 days of data recorded on Zio monitor. Patient had a min HR of 45 bpm, max HR of 184 bpm, and avg HR of 68 bpm. Predominant underlying rhythm was Sinus Rhythm. No VT, atrial fibrillation, high degree block, or pauses noted. 4 Supraventricular Tachycardia runs occurred, the run with the fastest interval lasting 6 beats with a max rate of 184 bpm, the longest lasting 11 beats with an avg rate of 109 bpm. Isolated atrial and ventricular ectopy was rare (<1%). There were 19 triggered events. These are all sinus rhythm based, 3 with PAC and 1 with PVC.  Ambulatory 24 hour BP monitoring 09/21/20 24 hour ambulatory blood pressure monitor reviewed. Remained hypertensive throughout study. Overall mean 176/79, HR 69. Systolic dipped less than 1% with sleep. Max reading 201/98.    EKG:  EKG is personally reviewed.   03/15/2023:  not ordered today 08/25/2022:  not ordered 02/04/2022 (Dr. Everardo Pacific):  NSR at 75 bpm. 08/16/2021 (ED): Sinus rhythm, rate 72 bpm, Borderline repolarization abnormality, When compared to prior, similar appearance. No STEMI. 06/08/2021 Sinus rhythm with 1st degree AV block, 86 bpm 06/23/20: NSR at 83 bpm with poor r wave progression  Recent Labs: 11/08/2022: BUN 20; Creatinine, Ser 0.95; Potassium 3.6; Sodium 142    Recent Lipid Panel    Component Value Date/Time   CHOL 283 (H) 12/16/2021 0957   TRIG 211 (H) 12/16/2021 0957   HDL 57 12/16/2021 0957   CHOLHDL 5.0 (H) 12/16/2021 0957   LDLCALC 186 (H) 12/16/2021 0957    Physical Exam:  VS:  BP (!) 208/86 (BP Location: Left Arm, Patient Position: Sitting, Cuff Size: Normal)   Pulse 72   Ht 5\' 2"  (1.575 m)   Wt 173 lb 8 oz (78.7 kg)   SpO2 99%   BMI 31.73 kg/m     Wt Readings from Last 3 Encounters:  03/15/23 173 lb 8 oz (78.7 kg)  02/28/23 172 lb 11.2 oz (78.3 kg)  12/01/22 168 lb 6.4 oz (76.4 kg)    GEN: Well nourished, well developed in no acute distress HEENT: Normal, moist mucous membranes NECK: No JVD CARDIAC: regular rhythm, normal S1 and S2, no rubs or gallops. No murmur. VASCULAR: Radial and DP pulses 2+ bilaterally. No carotid bruits RESPIRATORY:  Clear to auscultation without rales, wheezing or rhonchi  ABDOMEN: Soft, non-tender, non-distended MUSCULOSKELETAL:  Ambulates independently SKIN: Warm and dry, no edema NEUROLOGIC:  Alert and oriented x 3. No focal neuro deficits noted. PSYCHIATRIC:  Normal affect    ASSESSMENT:    1. Resistant hypertension   2. Medication intolerance   3. Mixed hyperlipidemia   4. Coronary artery calcification seen on CT scan     PLAN:    Hypertension, labile -see extensive prior discussion. Triggers are unclear. Today, reports BP much better when she held her thyroid medication. We discussed options at length -stress, pain, illness also significant triggers -has tolerated only HCTZ -has hydralazine 10 mg q6 hours PRN for systolic BP >160 -appreciate assistance from advanced hypertension clinic -after discussion, she is willing to trial 1 mg doxazosin again. Could gradually increase if tolerated  Intolerances: Spironolactone: rash beta blockers: felt terrible (remotely) Amlodipine: lightheadedness Minoxidil: face/tongue swelling Lisinopril: lightheadedness, stomach  pain Valsartan: lightheadedness, nausea, stomach pain  -lab workup for secondary causes summarized in HPI.   Mixed hyperlipidemia Coronary calcification -She has not tolerated statins. See prior extensive discussions re: nonstatin options, she declines. -calcium score 05/07/21 showed Ca score=27.7  Cardiac risk counseling and prevention recommendations: -we have discussed diet and exercise recommendations -recommend avoidance of tobacco products. Avoid excess alcohol.  Plan for follow up: 1 month with APP, or sooner as needed.  Jodelle Red, MD, PhD, [facility]  [facility]    Patient Instructions  We will go back to 1 mg dose of doxasozin. If your blood pressure is not consistently <150/90 after a week, call us and we will start the clonidine patch at 0.1 mg weekly.  Follow up 05/02/2023 9:15 am with Ronn Melena NP    I,Mathew Stumpf,acting as a scribe for Jodelle Red, MD.,have documented all relevant documentation on the behalf of Jodelle Red, MD,as directed by  Jodelle Red, MD while in the presence of Jodelle Red, MD.  I, Jodelle Red, MD, have reviewed all documentation for this visit. The documentation on 03/15/23 for the exam, diagnosis, procedures, and orders are all accurate and complete.   Signed, Jodelle Red, MD PhD 03/15/2023   [facility]

## 2023-03-15 NOTE — Patient Instructions (Addendum)
We will go back to 1 mg dose of doxasozin. If your blood pressure is not consistently <150/90 after a week, call us and we will start the clonidine patch at 0.1 mg weekly.  Follow up 05/02/2023 9:15 am with Ronn Melena NP

## 2023-03-21 DIAGNOSIS — S161XXA Strain of muscle, fascia and tendon at neck level, initial encounter: Secondary | ICD-10-CM | POA: Diagnosis not present

## 2023-03-21 DIAGNOSIS — M542 Cervicalgia: Secondary | ICD-10-CM | POA: Diagnosis not present

## 2023-03-29 DIAGNOSIS — M542 Cervicalgia: Secondary | ICD-10-CM | POA: Diagnosis not present

## 2023-03-29 DIAGNOSIS — M5412 Radiculopathy, cervical region: Secondary | ICD-10-CM | POA: Diagnosis not present

## 2023-03-30 DIAGNOSIS — S161XXA Strain of muscle, fascia and tendon at neck level, initial encounter: Secondary | ICD-10-CM | POA: Diagnosis not present

## 2023-03-30 DIAGNOSIS — M542 Cervicalgia: Secondary | ICD-10-CM | POA: Diagnosis not present

## 2023-04-04 DIAGNOSIS — I160 Hypertensive urgency: Secondary | ICD-10-CM | POA: Diagnosis not present

## 2023-04-04 DIAGNOSIS — R946 Abnormal results of thyroid function studies: Secondary | ICD-10-CM | POA: Diagnosis not present

## 2023-04-06 DIAGNOSIS — M542 Cervicalgia: Secondary | ICD-10-CM | POA: Diagnosis not present

## 2023-04-06 DIAGNOSIS — S161XXA Strain of muscle, fascia and tendon at neck level, initial encounter: Secondary | ICD-10-CM | POA: Diagnosis not present

## 2023-04-13 ENCOUNTER — Encounter (HOSPITAL_BASED_OUTPATIENT_CLINIC_OR_DEPARTMENT_OTHER): Payer: Self-pay | Admitting: Cardiology

## 2023-04-17 DIAGNOSIS — M542 Cervicalgia: Secondary | ICD-10-CM | POA: Diagnosis not present

## 2023-04-17 DIAGNOSIS — S161XXA Strain of muscle, fascia and tendon at neck level, initial encounter: Secondary | ICD-10-CM | POA: Diagnosis not present

## 2023-04-19 DIAGNOSIS — S161XXA Strain of muscle, fascia and tendon at neck level, initial encounter: Secondary | ICD-10-CM | POA: Diagnosis not present

## 2023-04-19 DIAGNOSIS — M542 Cervicalgia: Secondary | ICD-10-CM | POA: Diagnosis not present

## 2023-04-24 DIAGNOSIS — M542 Cervicalgia: Secondary | ICD-10-CM | POA: Diagnosis not present

## 2023-04-24 DIAGNOSIS — S161XXA Strain of muscle, fascia and tendon at neck level, initial encounter: Secondary | ICD-10-CM | POA: Diagnosis not present

## 2023-05-02 ENCOUNTER — Encounter (HOSPITAL_BASED_OUTPATIENT_CLINIC_OR_DEPARTMENT_OTHER): Payer: Self-pay | Admitting: Family

## 2023-05-02 ENCOUNTER — Ambulatory Visit (HOSPITAL_BASED_OUTPATIENT_CLINIC_OR_DEPARTMENT_OTHER): Payer: Medicare Other | Admitting: Family

## 2023-05-02 VITALS — BP 202/98 | HR 80 | Ht 62.0 in | Wt 173.3 lb

## 2023-05-02 DIAGNOSIS — E039 Hypothyroidism, unspecified: Secondary | ICD-10-CM | POA: Diagnosis not present

## 2023-05-02 DIAGNOSIS — I1A0 Resistant hypertension: Secondary | ICD-10-CM

## 2023-05-02 DIAGNOSIS — E785 Hyperlipidemia, unspecified: Secondary | ICD-10-CM | POA: Diagnosis not present

## 2023-05-02 DIAGNOSIS — I25118 Atherosclerotic heart disease of native coronary artery with other forms of angina pectoris: Secondary | ICD-10-CM | POA: Diagnosis not present

## 2023-05-02 MED ORDER — DOXAZOSIN MESYLATE 2 MG PO TABS: 2.0000 mg | ORAL_TABLET | Freq: Every day | ORAL | Status: DC

## 2023-05-02 NOTE — Progress Notes (Signed)
Cardiology Office Note:  .   Date:  05/02/2023  ID:  Regina Marsh, DOB October 28, 1945, MRN [id number] PCP: Shon Hale, MD  [facility] Providers Cardiologist:  Jodelle Red, MD    History of Present Illness: [name]   Regina Marsh is a 77 y.o. female with history of Sjogren's, hypertension, hyperlipidemia, coronary artery disease, GERD.  BP has been difficult to control given multiple medication intolerances.  Previously metanephrines, renin-aldosterone unremarkable.  Renal duplex 06/17/2022 with no stenosis.  Prior coronary calcium score 04/29/2021 of 27.7 placing her in the 42nd percentile for age, race, sex matched control.  Last seen 03/15/23 at which time HCTZ 25 mg daily was continued and doxazosin 1 mg every evening resumed.  Today for follow-up independently.  Notes since last seen she has been started on levothyroxine by her PCP which she attributes to her elevated blood pressure. Has repeat thyroid labs in a couple weeks. She noted she was having hot flashes - feels they have worsened since starting medication. No changes in bowel habits. Tells me her blood pressure in the morning after her levothyroxine will be 180-190 and then in the afternoon will have 150s. Per her report prior to starting levothyroxine SBP 135-145. We discussed that elevate BP would be uncommon side effects of levothyroxine. Has also been referred to rheumatology to discuss whether her autoimmune issues may be contributing to thyroid abnormalities.   Reports having low energy level. Feels she tires quickly. Some shortness of breath which she attributes to allergies. No chest pain, pressure, tightness. Reports rare lightheadedness. She also notes neck spasms in her neck and is presently doing PT with dry needling and cupping which has ben helping.   Previous hypertensives Spironolactone: rash beta blockers: felt terrible (remotely) Amlodipine: lightheadedness Minoxidil: face/tongue  swelling Lisinopril: lightheadedness, stomach pain Valsartan: lightheadedness, nausea, stomach pain Chlorthalidone: lightheadedness, nausea Hydralazine: joint pain and fatigue  ROS: Please see the history of present illness.    All other systems reviewed and are negative.   Studies Reviewed: .        Cardiac Studies & Procedures       ECHOCARDIOGRAM  ECHOCARDIOGRAM COMPLETE 02/04/2022  Narrative ECHOCARDIOGRAM REPORT    Patient Name:   Regina Marsh Date of Exam: 02/04/2022 Medical Rec #:  [id number]       Height:       62.0 in Accession #:    [id number]      Weight:       161.0 lb Date of Birth:  22-Feb-1946        BSA:          1.743 m Patient Age:    75 years        BP:           152/96 mmHg Patient Gender: F               HR:           78 bpm. Exam Location:  [address]  Procedure: 2D Echo, Cardiac Doppler and Color Doppler  Indications:    I16.0 Hypertensive heart disease.  History:        Patient has no prior history of Echocardiogram examinations. Risk Factors:Hypertension.  Sonographer:    Daphine Deutscher RDCS Referring Phys: [id number] Shon Hale  IMPRESSIONS   1. Left ventricular ejection fraction, by estimation, is 60 to 65%. The left ventricle has normal function. The left ventricle has no regional wall motion abnormalities. There is mild concentric left ventricular  hypertrophy. Left ventricular diastolic parameters are consistent with Grade I diastolic dysfunction (impaired relaxation). Elevated left ventricular end-diastolic pressure. 2. Right ventricular systolic function is normal. The right ventricular size is normal. There is normal pulmonary artery systolic pressure. 3. Left atrial size was mildly dilated. 4. The mitral valve is normal in structure. Trivial mitral valve regurgitation. No evidence of mitral stenosis. 5. The aortic valve is tricuspid. Aortic valve regurgitation is not visualized. No aortic stenosis is present. 6. The  inferior vena cava is normal in size with greater than 50% respiratory variability, suggesting right atrial pressure of 3 mmHg.  FINDINGS Left Ventricle: Left ventricular ejection fraction, by estimation, is 60 to 65%. The left ventricle has normal function. The left ventricle has no regional wall motion abnormalities. The left ventricular internal cavity size was normal in size. There is mild concentric left ventricular hypertrophy. Left ventricular diastolic parameters are consistent with Grade I diastolic dysfunction (impaired relaxation). Elevated left ventricular end-diastolic pressure.  Right Ventricle: The right ventricular size is normal. No increase in right ventricular wall thickness. Right ventricular systolic function is normal. There is normal pulmonary artery systolic pressure. The tricuspid regurgitant velocity is 2.16 m/s, and with an assumed right atrial pressure of 3 mmHg, the estimated right ventricular systolic pressure is 21.7 mmHg.  Left Atrium: Left atrial size was mildly dilated.  Right Atrium: Right atrial size was normal in size.  Pericardium: There is no evidence of pericardial effusion.  Mitral Valve: The mitral valve is normal in structure. Trivial mitral valve regurgitation. No evidence of mitral valve stenosis.  Tricuspid Valve: The tricuspid valve is normal in structure. Tricuspid valve regurgitation is trivial. No evidence of tricuspid stenosis.  Aortic Valve: The aortic valve is tricuspid. Aortic valve regurgitation is not visualized. No aortic stenosis is present.  Pulmonic Valve: The pulmonic valve was normal in structure. Pulmonic valve regurgitation is trivial. No evidence of pulmonic stenosis.  Aorta: The aortic root is normal in size and structure.  Venous: The inferior vena cava is normal in size with greater than 50% respiratory variability, suggesting right atrial pressure of 3 mmHg.  IAS/Shunts: No atrial level shunt detected by color flow  Doppler.   LEFT VENTRICLE PLAX 2D LVIDd:         4.60 cm   Diastology LVIDs:         3.10 cm   LV e' medial:    4.13 cm/s LV PW:         1.20 cm   LV E/e' medial:  17.2 LV IVS:        1.20 cm   LV e' lateral:   3.59 cm/s LVOT diam:     1.70 cm   LV E/e' lateral: 19.8 LV SV:         56 LV SV Index:   32 LVOT Area:     2.27 cm   RIGHT VENTRICLE             IVC RV Basal diam:  3.20 cm     IVC diam: 1.40 cm RV S prime:     15.50 cm/s TAPSE (M-mode): 2.3 cm  LEFT ATRIUM             Index        RIGHT ATRIUM           Index LA diam:        5.40 cm 3.10 cm/m   RA Area:     10.60 cm LA Vol (A2C):  31.2 ml 17.90 ml/m  RA Volume:   22.30 ml  12.79 ml/m LA Vol (A4C):   52.5 ml 30.11 ml/m LA Biplane Vol: 41.6 ml 23.86 ml/m AORTIC VALVE LVOT Vmax:   118.00 cm/s LVOT Vmean:  78.050 cm/s LVOT VTI:    0.248 m  AORTA Ao Root diam: 3.50 cm Ao Asc diam:  3.00 cm  MITRAL VALVE                TRICUSPID VALVE MV Area (PHT): 3.26 cm     TR Peak grad:   18.7 mmHg MV Decel Time: 233 msec     TR Vmax:        216.00 cm/s MV E velocity: 71.00 cm/s MV A velocity: 112.00 cm/s  SHUNTS MV E/A ratio:  0.63         Systemic VTI:  0.25 m Systemic Diam: 1.70 cm  Chilton Si MD Electronically signed by Chilton Si MD Signature Date/Time: 02/04/2022/4:05:42 PM    Final    MONITORS  LONG TERM MONITOR (3-14 DAYS) 09/09/2020  Narrative 14 days of data recorded on Zio monitor. Patient had a min HR of 45 bpm, max HR of 184 bpm, and avg HR of 68 bpm. Predominant underlying rhythm was Sinus Rhythm. No VT, atrial fibrillation, high degree block, or pauses noted. 4 Supraventricular Tachycardia runs occurred, the run with the fastest interval lasting 6 beats with a max rate of 184 bpm, the longest lasting 11 beats with an avg rate of 109 bpm. Isolated atrial and ventricular ectopy was rare (<1%). There were 19 triggered events. These are all sinus rhythm based, 3 with PAC and 1 with PVC.    CT SCANS  CT CARDIAC SCORING (SELF PAY ONLY) 05/07/2021  Addendum 05/07/2021  2:18 PM ADDENDUM REPORT: 05/07/2021 14:15  CLINICAL DATA:  Cardiovascular Disease Risk stratification  EXAM: Coronary Calcium Score  TECHNIQUE: A gated, non-contrast computed tomography scan of the heart was performed using 3mm slice thickness. Axial images were analyzed on a dedicated workstation. Calcium scoring of the coronary arteries was performed using the Agatston method.  FINDINGS: Coronary arteries: Normal origins.  Coronary Calcium Score:  Left main: 0  Left anterior descending artery: 1.42  Left circumflex artery: 0  Right coronary artery: 26.3  Total: 27.7  Percentile: 42nd  Pericardium: Normal.  Ascending Aorta: Normal caliber.  Non-cardiac: See separate report from [facility].  IMPRESSION: Coronary calcium score of 27.7. This was 42nd percentile for age-, race-, and sex-matched controls.  RECOMMENDATIONS: Coronary artery calcium (CAC) score is a strong predictor of incident coronary heart disease (CHD) and provides predictive information beyond traditional risk factors. CAC scoring is reasonable to use in the decision to withhold, postpone, or initiate statin therapy in intermediate-risk or selected borderline-risk asymptomatic adults (age 83-75 years and LDL-C >=70 to <190 mg/dL) who do not have diabetes or established atherosclerotic cardiovascular disease (ASCVD).* In intermediate-risk (10-year ASCVD risk >=7.5% to <20%) adults or selected borderline-risk (10-year ASCVD risk >=5% to <7.5%) adults in whom a CAC score is measured for the purpose of making a treatment decision the following recommendations have been made:  If CAC=0, it is reasonable to withhold statin therapy and reassess in 5 to 10 years, as long as higher risk conditions are absent (diabetes mellitus, family history of premature CHD in first degree relatives (males <55 years; females  <65 years), cigarette smoking, or LDL >=190 mg/dL).  If CAC is 1 to 99, it is reasonable to initiate statin therapy for  patients >=30 years of age.  If CAC is >=100 or >=75th percentile, it is reasonable to initiate statin therapy at any age.  Cardiology referral should be considered for patients with CAC scores >=400 or >=75th percentile.  *2018 AHA/ACC/AACVPR/AAPA/ABC/ACPM/ADA/AGS/APhA/ASPC/NLA/PCNA Guideline on the Management of Blood Cholesterol: A Report of the American College of Cardiology/American Heart Association Task Force on Clinical Practice Guidelines. J Am Coll Cardiol. 2019;73(24):3168-3209.  Zoila Shutter, MD   Electronically Signed By: Chrystie Nose M.D. On: 05/07/2021 14:15  Narrative EXAM: OVER-READ INTERPRETATION  CT CHEST  The following report is an over-read performed by radiologist Dr. Richarda Overlie of [facility] on 05/07/2021. This over-read does not include interpretation of cardiac or coronary anatomy or pathology. The coronary calcium score interpretation by the cardiologist is attached.  COMPARISON:  None.  FINDINGS: Vascular: Heart size is prominent. No significant pericardial fluid. Normal caliber of the visualized thoracic aorta.  Mediastinum/Nodes: Small to moderate sized hiatal hernia. There may be trace fluid or edema along the right side of the distal esophagus near the hiatal hernia.  Lungs/Pleura: 5 mm nodule in the right lower lobe on sequence 4, image 32. Additional small pleural-based nodules. Few densities in the medial right lower lobe likely represent atelectasis. No large pleural effusions. No significant airspace disease or consolidation.  Upper Abdomen: Images of upper abdomen are unremarkable.  Musculoskeletal: Multilevel degenerative disc and endplate changes in thoracic spine.  IMPRESSION: 1. Small to moderate sized hiatal hernia. There may be trace fluid or edema along the right side of the distal  esophagus which is nonspecific. 2. Small pulmonary nodules, largest measuring 5 mm in the right lower lobe. These small pulmonary nodules are indeterminate. No follow-up needed if patient is low-risk (and has no known or suspected primary neoplasm). Non-contrast chest CT can be considered in 12 months if patient is high-risk. This recommendation follows the consensus statement: Guidelines for Management of Incidental Pulmonary Nodules Detected on CT Images: From the Fleischner Society 2017; Radiology 2017; 284:228-243.  Electronically Signed: By: Richarda Overlie M.D. On: 05/07/2021 10:05          Risk Assessment/Calculations:     HYPERTENSION CONTROL Vitals:   05/02/23 0907 05/02/23 0911 05/02/23 0927  BP: (!) 231/91 (!) 216/95 (!) 202/98    The patient's blood pressure is elevated above target today.  In order to address the patient's elevated BP: A current anti-hypertensive medication was adjusted today.; Follow up with general cardiology has been recommended.          Physical Exam:   VS:  BP (!) 202/98   Pulse 80   Ht 5\' 2"  (1.575 m)   Wt 173 lb 4.8 oz (78.6 kg)   SpO2 98%   BMI 31.70 kg/m    Wt Readings from Last 3 Encounters:  05/02/23 173 lb 4.8 oz (78.6 kg)  03/15/23 173 lb 8 oz (78.7 kg)  02/28/23 172 lb 11.2 oz (78.3 kg)    GEN: Well nourished, well developed in no acute distress NECK: No JVD; No carotid bruits CARDIAC: RRR, no murmurs, rubs, gallops RESPIRATORY:  Clear to auscultation without rales, wheezing or rhonchi  ABDOMEN: Soft, non-tender, non-distended EXTREMITIES:  No edema; No deformity   ASSESSMENT AND PLAN: .    HTN -likely whitecoat hypertension superimposed on hypertension.  Initial BP in clinic 231/91 with repeat 202/98.  Home BP systolic ranging 540-981.  Multiple prior intolerances detailed above.  She is hesitant regarding adding new medication as she was started on  thyroid medication a month ago.  As such, we will continue  hydrochlorothiazide 25 mg daily.  Increase doxazosin from 1 mg to 2 mg every evening.  Future considerations include clonidine patch.  Previous secondary workup including renal aldosterone, metanephrines, renal duplex unremarkable.  She declines renal denervation as she is not interested in procedures at this time. Instructed to bring BP cuff to next visit to ensure accuracy.   Hypothyroidism - Recently diagnosed. Started low dose levothyroxine one month ago. Has follow up labs and OV in 2 weeks with PCP. Reassured that I would not expect levothyroxine to significant impact BP.   CAD / HLD - Stable with no anginal symptoms. No indication for ischemic evaluation.  GDMT Aspirin. Previously did not tolerate statin, beta blocker. Recommend aiming for 150 minutes of moderate intensity activity per week and following a heart healthy diet.         Dispo: follow up in 1 month  Signed, Alver Sorrow, NP

## 2023-05-02 NOTE — Patient Instructions (Signed)
Medication Instructions:  Your physician has recommended you make the following change in your medication:   Increase Doxazosin 2mg  ( two 1mg  tablets) at night  *If you need a refill on your cardiac medications before your next appointment, please call your pharmacy*  Follow-Up: At [facility], you and your health needs are our priority.  As part of our continuing mission to provide you with exceptional heart care, we have created designated Provider Care Teams.  These Care Teams include your primary Cardiologist (physician) and Advanced Practice Providers (APPs -  Physician Assistants and Nurse Practitioners) who all work together to provide you with the care you need, when you need it.  We recommend signing up for the patient portal called "MyChart".  Sign up information is provided on this After Visit Summary.  MyChart is used to connect with patients for Virtual Visits (Telemedicine).  Patients are able to view lab/test results, encounter notes, upcoming appointments, etc.  Non-urgent messages can be sent to your provider as well.   To learn more about what you can do with MyChart, go to [email or web address].    Your next appointment:   1 month follow up with Dr. Cristal Deer or [name], NP   Other Instructions We will check in on Blood pressure in one week

## 2023-05-03 DIAGNOSIS — M542 Cervicalgia: Secondary | ICD-10-CM | POA: Diagnosis not present

## 2023-05-03 DIAGNOSIS — S161XXA Strain of muscle, fascia and tendon at neck level, initial encounter: Secondary | ICD-10-CM | POA: Diagnosis not present

## 2023-05-04 ENCOUNTER — Encounter (HOSPITAL_BASED_OUTPATIENT_CLINIC_OR_DEPARTMENT_OTHER): Payer: Self-pay | Admitting: Cardiology

## 2023-05-09 ENCOUNTER — Encounter (HOSPITAL_BASED_OUTPATIENT_CLINIC_OR_DEPARTMENT_OTHER): Payer: Self-pay

## 2023-05-15 DIAGNOSIS — R946 Abnormal results of thyroid function studies: Secondary | ICD-10-CM | POA: Diagnosis not present

## 2023-05-16 DIAGNOSIS — S161XXA Strain of muscle, fascia and tendon at neck level, initial encounter: Secondary | ICD-10-CM | POA: Diagnosis not present

## 2023-05-16 DIAGNOSIS — M542 Cervicalgia: Secondary | ICD-10-CM | POA: Diagnosis not present

## 2023-05-17 DIAGNOSIS — M3501 Sicca syndrome with keratoconjunctivitis: Secondary | ICD-10-CM | POA: Diagnosis not present

## 2023-05-17 DIAGNOSIS — M2559 Pain in other specified joint: Secondary | ICD-10-CM | POA: Diagnosis not present

## 2023-05-17 DIAGNOSIS — M5030 Other cervical disc degeneration, unspecified cervical region: Secondary | ICD-10-CM | POA: Diagnosis not present

## 2023-05-18 ENCOUNTER — Encounter (HOSPITAL_BASED_OUTPATIENT_CLINIC_OR_DEPARTMENT_OTHER): Payer: Self-pay

## 2023-05-18 NOTE — Telephone Encounter (Signed)
FYI update

## 2023-05-30 DIAGNOSIS — M542 Cervicalgia: Secondary | ICD-10-CM | POA: Diagnosis not present

## 2023-05-30 DIAGNOSIS — S161XXA Strain of muscle, fascia and tendon at neck level, initial encounter: Secondary | ICD-10-CM | POA: Diagnosis not present

## 2023-05-31 DIAGNOSIS — M3501 Sicca syndrome with keratoconjunctivitis: Secondary | ICD-10-CM | POA: Diagnosis not present

## 2023-05-31 DIAGNOSIS — M2559 Pain in other specified joint: Secondary | ICD-10-CM | POA: Diagnosis not present

## 2023-05-31 DIAGNOSIS — M5030 Other cervical disc degeneration, unspecified cervical region: Secondary | ICD-10-CM | POA: Diagnosis not present

## 2023-06-02 ENCOUNTER — Encounter (HOSPITAL_BASED_OUTPATIENT_CLINIC_OR_DEPARTMENT_OTHER): Payer: Self-pay | Admitting: Family

## 2023-06-02 ENCOUNTER — Ambulatory Visit (HOSPITAL_BASED_OUTPATIENT_CLINIC_OR_DEPARTMENT_OTHER): Payer: Medicare Other | Admitting: Family

## 2023-06-02 VITALS — BP 196/98 | HR 86 | Ht 62.0 in | Wt 174.5 lb

## 2023-06-02 DIAGNOSIS — I25118 Atherosclerotic heart disease of native coronary artery with other forms of angina pectoris: Secondary | ICD-10-CM

## 2023-06-02 DIAGNOSIS — E785 Hyperlipidemia, unspecified: Secondary | ICD-10-CM | POA: Diagnosis not present

## 2023-06-02 DIAGNOSIS — I1A0 Resistant hypertension: Secondary | ICD-10-CM | POA: Diagnosis not present

## 2023-06-02 MED ORDER — HYDROCHLOROTHIAZIDE 25 MG PO TABS: 12.5000 mg | ORAL_TABLET | Freq: Every day | ORAL | 3 refills | Status: DC

## 2023-06-02 MED ORDER — DOXAZOSIN MESYLATE 1 MG PO TABS: 3.0000 mg | ORAL_TABLET | Freq: Every day | ORAL | 1 refills | Status: DC

## 2023-06-02 NOTE — Progress Notes (Signed)
Cardiology Office Note:  .   Date:  06/02/2023  ID:  Regina Marsh, DOB 08/01/1946, MRN [id number] PCP: Shon Hale, MD  [facility] Providers Cardiologist:  Jodelle Red, MD    History of Present Illness: Regina Marsh   Regina Marsh is a 77 y.o. female with history of Sjogren's, hypertension, hyperlipidemia, coronary artery disease, GERD.  BP has been difficult to control given multiple medication intolerances.  Previously metanephrines, renin-aldosterone unremarkable.  Renal duplex 06/17/2022 with no stenosis.  Prior coronary calcium score 04/29/2021 of 27.7 placing her in the 42nd percentile for age, race, sex matched control.  Seen 03/15/23 at which time HCTZ 25 mg daily was continued and doxazosin 1 mg every evening resumed.  Seen 05/02/23 at which time Doxazosin increased to 2mg  daily. Since last seen saw Dr. Dierdre Forth with no new autoimmune but more trouble with Sjogren's symptoms. At night is having dryness of her eyelids. Starting PT for leg pain which is thought might be related to pinched nerve in the back. BP at home overall 150s/80s.   Previous hypertensives Spironolactone: rash Beta blockers: felt terrible (remotely) Amlodipine: lightheadedness Minoxidil: face/tongue swelling Lisinopril: lightheadedness, stomach pain Valsartan: lightheadedness, nausea, stomach pain Chlorthalidone: lightheadedness, nausea Hydralazine: joint pain and fatigue  ROS: Please see the history of present illness.    All other systems reviewed and are negative.   Studies Reviewed: .        Cardiac Studies & Procedures       ECHOCARDIOGRAM  ECHOCARDIOGRAM COMPLETE 02/04/2022  Narrative ECHOCARDIOGRAM REPORT    Patient Name:   Regina Marsh Date of Exam: 02/04/2022 Medical Rec #:  [id number]       Height:       62.0 in Accession #:    [id number]      Weight:       161.0 lb Date of Birth:  09-Feb-1946        BSA:          1.743 m Patient Age:    75 years        BP:            152/96 mmHg Patient Gender: F               HR:           78 bpm. Exam Location:  [address]  Procedure: 2D Echo, Cardiac Doppler and Color Doppler  Indications:    I16.0 Hypertensive heart disease.  History:        Patient has no prior history of Echocardiogram examinations. Risk Factors:Hypertension.  Sonographer:    Daphine Deutscher RDCS Referring Phys: [id number] Shon Hale  IMPRESSIONS   1. Left ventricular ejection fraction, by estimation, is 60 to 65%. The left ventricle has normal function. The left ventricle has no regional wall motion abnormalities. There is mild concentric left ventricular hypertrophy. Left ventricular diastolic parameters are consistent with Grade I diastolic dysfunction (impaired relaxation). Elevated left ventricular end-diastolic pressure. 2. Right ventricular systolic function is normal. The right ventricular size is normal. There is normal pulmonary artery systolic pressure. 3. Left atrial size was mildly dilated. 4. The mitral valve is normal in structure. Trivial mitral valve regurgitation. No evidence of mitral stenosis. 5. The aortic valve is tricuspid. Aortic valve regurgitation is not visualized. No aortic stenosis is present. 6. The inferior vena cava is normal in size with greater than 50% respiratory variability, suggesting right atrial pressure of 3 mmHg.  FINDINGS Left Ventricle: Left ventricular ejection fraction,  by estimation, is 60 to 65%. The left ventricle has normal function. The left ventricle has no regional wall motion abnormalities. The left ventricular internal cavity size was normal in size. There is mild concentric left ventricular hypertrophy. Left ventricular diastolic parameters are consistent with Grade I diastolic dysfunction (impaired relaxation). Elevated left ventricular end-diastolic pressure.  Right Ventricle: The right ventricular size is normal. No increase in right ventricular wall thickness. Right  ventricular systolic function is normal. There is normal pulmonary artery systolic pressure. The tricuspid regurgitant velocity is 2.16 m/s, and with an assumed right atrial pressure of 3 mmHg, the estimated right ventricular systolic pressure is 21.7 mmHg.  Left Atrium: Left atrial size was mildly dilated.  Right Atrium: Right atrial size was normal in size.  Pericardium: There is no evidence of pericardial effusion.  Mitral Valve: The mitral valve is normal in structure. Trivial mitral valve regurgitation. No evidence of mitral valve stenosis.  Tricuspid Valve: The tricuspid valve is normal in structure. Tricuspid valve regurgitation is trivial. No evidence of tricuspid stenosis.  Aortic Valve: The aortic valve is tricuspid. Aortic valve regurgitation is not visualized. No aortic stenosis is present.  Pulmonic Valve: The pulmonic valve was normal in structure. Pulmonic valve regurgitation is trivial. No evidence of pulmonic stenosis.  Aorta: The aortic root is normal in size and structure.  Venous: The inferior vena cava is normal in size with greater than 50% respiratory variability, suggesting right atrial pressure of 3 mmHg.  IAS/Shunts: No atrial level shunt detected by color flow Doppler.   LEFT VENTRICLE PLAX 2D LVIDd:         4.60 cm   Diastology LVIDs:         3.10 cm   LV e' medial:    4.13 cm/s LV PW:         1.20 cm   LV E/e' medial:  17.2 LV IVS:        1.20 cm   LV e' lateral:   3.59 cm/s LVOT diam:     1.70 cm   LV E/e' lateral: 19.8 LV SV:         56 LV SV Index:   32 LVOT Area:     2.27 cm   RIGHT VENTRICLE             IVC RV Basal diam:  3.20 cm     IVC diam: 1.40 cm RV S prime:     15.50 cm/s TAPSE (M-mode): 2.3 cm  LEFT ATRIUM             Index        RIGHT ATRIUM           Index LA diam:        5.40 cm 3.10 cm/m   RA Area:     10.60 cm LA Vol (A2C):   31.2 ml 17.90 ml/m  RA Volume:   22.30 ml  12.79 ml/m LA Vol (A4C):   52.5 ml 30.11 ml/m LA  Biplane Vol: 41.6 ml 23.86 ml/m AORTIC VALVE LVOT Vmax:   118.00 cm/s LVOT Vmean:  78.050 cm/s LVOT VTI:    0.248 m  AORTA Ao Root diam: 3.50 cm Ao Asc diam:  3.00 cm  MITRAL VALVE                TRICUSPID VALVE MV Area (PHT): 3.26 cm     TR Peak grad:   18.7 mmHg MV Decel Time: 233 msec  TR Vmax:        216.00 cm/s MV E velocity: 71.00 cm/s MV A velocity: 112.00 cm/s  SHUNTS MV E/A ratio:  0.63         Systemic VTI:  0.25 m Systemic Diam: 1.70 cm  Chilton Si MD Electronically signed by Chilton Si MD Signature Date/Time: 02/04/2022/4:05:42 PM    Final    MONITORS  LONG TERM MONITOR (3-14 DAYS) 09/09/2020  Narrative 14 days of data recorded on Zio monitor. Patient had a min HR of 45 bpm, max HR of 184 bpm, and avg HR of 68 bpm. Predominant underlying rhythm was Sinus Rhythm. No VT, atrial fibrillation, high degree block, or pauses noted. 4 Supraventricular Tachycardia runs occurred, the run with the fastest interval lasting 6 beats with a max rate of 184 bpm, the longest lasting 11 beats with an avg rate of 109 bpm. Isolated atrial and ventricular ectopy was rare (<1%). There were 19 triggered events. These are all sinus rhythm based, 3 with PAC and 1 with PVC.   CT SCANS  CT CARDIAC SCORING (SELF PAY ONLY) 05/07/2021  Addendum 05/07/2021  2:18 PM ADDENDUM REPORT: 05/07/2021 14:15  CLINICAL DATA:  Cardiovascular Disease Risk stratification  EXAM: Coronary Calcium Score  TECHNIQUE: A gated, non-contrast computed tomography scan of the heart was performed using 3mm slice thickness. Axial images were analyzed on a dedicated workstation. Calcium scoring of the coronary arteries was performed using the Agatston method.  FINDINGS: Coronary arteries: Normal origins.  Coronary Calcium Score:  Left main: 0  Left anterior descending artery: 1.42  Left circumflex artery: 0  Right coronary artery: 26.3  Total: 27.7  Percentile: 42nd  Pericardium:  Normal.  Ascending Aorta: Normal caliber.  Non-cardiac: See separate report from [facility].  IMPRESSION: Coronary calcium score of 27.7. This was 42nd percentile for age-, race-, and sex-matched controls.  RECOMMENDATIONS: Coronary artery calcium (CAC) score is a strong predictor of incident coronary heart disease (CHD) and provides predictive information beyond traditional risk factors. CAC scoring is reasonable to use in the decision to withhold, postpone, or initiate statin therapy in intermediate-risk or selected borderline-risk asymptomatic adults (age 37-75 years and LDL-C >=70 to <190 mg/dL) who do not have diabetes or established atherosclerotic cardiovascular disease (ASCVD).* In intermediate-risk (10-year ASCVD risk >=7.5% to <20%) adults or selected borderline-risk (10-year ASCVD risk >=5% to <7.5%) adults in whom a CAC score is measured for the purpose of making a treatment decision the following recommendations have been made:  If CAC=0, it is reasonable to withhold statin therapy and reassess in 5 to 10 years, as long as higher risk conditions are absent (diabetes mellitus, family history of premature CHD in first degree relatives (males <55 years; females <65 years), cigarette smoking, or LDL >=190 mg/dL).  If CAC is 1 to 99, it is reasonable to initiate statin therapy for patients >=36 years of age.  If CAC is >=100 or >=75th percentile, it is reasonable to initiate statin therapy at any age.  Cardiology referral should be considered for patients with CAC scores >=400 or >=75th percentile.  *2018 AHA/ACC/AACVPR/AAPA/ABC/ACPM/ADA/AGS/APhA/ASPC/NLA/PCNA Guideline on the Management of Blood Cholesterol: A Report of the American College of Cardiology/American Heart Association Task Force on Clinical Practice Guidelines. J Am Coll Cardiol. 2019;73(24):3168-3209.  Zoila Shutter, MD   Electronically Signed By: Chrystie Nose M.D. On: 05/07/2021  14:15  Narrative EXAM: OVER-READ INTERPRETATION  CT CHEST  The following report is an over-read performed by radiologist Dr. Richarda Overlie of [facility] on 05/07/2021. This over-read does not include interpretation of cardiac or coronary anatomy or pathology. The coronary calcium score interpretation by the cardiologist is attached.  COMPARISON:  None.  FINDINGS: Vascular: Heart size is prominent. No significant pericardial fluid. Normal caliber of the visualized thoracic aorta.  Mediastinum/Nodes: Small to moderate sized hiatal hernia. There may be trace fluid or edema along the right side of the distal esophagus near the hiatal hernia.  Lungs/Pleura: 5 mm nodule in the right lower lobe on sequence 4, image 32. Additional small pleural-based nodules. Few densities in the medial right lower lobe likely represent atelectasis. No large pleural effusions. No significant airspace disease or consolidation.  Upper Abdomen: Images of upper abdomen are unremarkable.  Musculoskeletal: Multilevel degenerative disc and endplate changes in thoracic spine.  IMPRESSION: 1. Small to moderate sized hiatal hernia. There may be trace fluid or edema along the right side of the distal esophagus which is nonspecific. 2. Small pulmonary nodules, largest measuring 5 mm in the right lower lobe. These small pulmonary nodules are indeterminate. No follow-up needed if patient is low-risk (and has no known or suspected primary neoplasm). Non-contrast chest CT can be considered in 12 months if patient is high-risk. This recommendation follows the consensus statement: Guidelines for Management of Incidental Pulmonary Nodules Detected on CT Images: From the Fleischner Society 2017; Radiology 2017; 284:228-243.  Electronically Signed: By: Richarda Overlie M.D. On: 05/07/2021 10:05          Risk Assessment/Calculations:     HYPERTENSION CONTROL Vitals:   06/02/23 0853 06/02/23 0854 06/02/23  0936  BP: (!) 222/98 (!) 220/94 (!) 196/98    The patient's blood pressure is elevated above target today.  In order to address the patient's elevated BP: A current anti-hypertensive medication was adjusted today.; Follow up with general cardiology has been recommended.          Physical Exam:   VS:  BP (!) 196/98 Comment: right arm  Pulse 86   Ht 5\' 2"  (1.575 m)   Wt 174 lb 8 oz (79.2 kg)   BMI 31.92 kg/m    Wt Readings from Last 3 Encounters:  06/02/23 174 lb 8 oz (79.2 kg)  05/02/23 173 lb 4.8 oz (78.6 kg)  03/15/23 173 lb 8 oz (78.7 kg)    GEN: Well nourished, well developed in no acute distress NECK: No JVD; No carotid bruits CARDIAC: RRR, no murmurs, rubs, gallops RESPIRATORY:  Clear to auscultation without rales, wheezing or rhonchi  ABDOMEN: Soft, non-tender, non-distended EXTREMITIES:  No edema; No deformity   ASSESSMENT AND PLAN: .    HTN -whitecoat hypertension superimposed on hypertension.  Initial BP in clinic 222/98 and 220/94 with repeat 196/98.  Home BP systolic most often 150s.  Multiple prior intolerances detailed above.  Reduce hydrochlorothiazide to 12.5 mg daily due to dryness and Sjogren's.  Increase doxazosin from 2 mg to 3 mg every evening.  Future considerations include clonidine patch.  Previous secondary workup including renal aldosterone, metanephrines, renal duplex unremarkable.  She declines renal denervation as she is not interested in procedures at this time. Home BP cuff found to be accurate today.   Hypothyroidism - Started low dose levothyroxine with improvement in TSH. Continue to follow with PCP.    CAD / HLD - Stable with no anginal symptoms. No indication for ischemic evaluation.  GDMT Aspirin. Previously did not tolerate statin, beta blocker. Recommend aiming for 150 minutes of moderate intensity activity per week and following a  heart healthy diet.         Dispo: follow up in 2-3 month  Signed, Alver Sorrow, NP

## 2023-06-02 NOTE — Patient Instructions (Signed)
Medication Instructions:  Your physician has recommended you make the following change in your medication:   Change: hydrochlorothiazide 1/2 tablet in the evening   Increase: Doxazosin 3mg  daily at bed time  *If you need a refill on your cardiac medications before your next appointment, please call your pharmacy*   Follow-Up: At [facility], you and your health needs are our priority.  As part of our continuing mission to provide you with exceptional heart care, we have created designated Provider Care Teams.  These Care Teams include your primary Cardiologist (physician) and Advanced Practice Providers (APPs -  Physician Assistants and Nurse Practitioners) who all work together to provide you with the care you need, when you need it.  We recommend signing up for the patient portal called "MyChart".  Sign up information is provided on this After Visit Summary.  MyChart is used to connect with patients for Virtual Visits (Telemedicine).  Patients are able to view lab/test results, encounter notes, upcoming appointments, etc.  Non-urgent messages can be sent to your provider as well.   To learn more about what you can do with MyChart, go to [email or web address].    Your next appointment:   2-3 months with Dr. Cristal Deer or [name], NP

## 2023-06-07 DIAGNOSIS — M5416 Radiculopathy, lumbar region: Secondary | ICD-10-CM | POA: Diagnosis not present

## 2023-06-13 DIAGNOSIS — M5416 Radiculopathy, lumbar region: Secondary | ICD-10-CM | POA: Diagnosis not present

## 2023-06-19 ENCOUNTER — Encounter (HOSPITAL_BASED_OUTPATIENT_CLINIC_OR_DEPARTMENT_OTHER): Payer: Self-pay

## 2023-06-19 NOTE — Telephone Encounter (Signed)
BP log as requested 

## 2023-06-22 DIAGNOSIS — M5416 Radiculopathy, lumbar region: Secondary | ICD-10-CM | POA: Diagnosis not present

## 2023-06-29 DIAGNOSIS — M5416 Radiculopathy, lumbar region: Secondary | ICD-10-CM | POA: Diagnosis not present

## 2023-07-06 DIAGNOSIS — M5416 Radiculopathy, lumbar region: Secondary | ICD-10-CM | POA: Diagnosis not present

## 2023-07-14 DIAGNOSIS — M5416 Radiculopathy, lumbar region: Secondary | ICD-10-CM | POA: Diagnosis not present

## 2023-07-20 DIAGNOSIS — M5416 Radiculopathy, lumbar region: Secondary | ICD-10-CM | POA: Diagnosis not present

## 2023-08-03 DIAGNOSIS — M5416 Radiculopathy, lumbar region: Secondary | ICD-10-CM | POA: Diagnosis not present

## 2023-08-04 ENCOUNTER — Ambulatory Visit (HOSPITAL_BASED_OUTPATIENT_CLINIC_OR_DEPARTMENT_OTHER): Payer: Medicare Other | Admitting: Family

## 2023-08-04 ENCOUNTER — Encounter (HOSPITAL_BASED_OUTPATIENT_CLINIC_OR_DEPARTMENT_OTHER): Payer: Self-pay | Admitting: Family

## 2023-08-04 VITALS — BP 153/77 | HR 82 | Ht 62.0 in | Wt 173.4 lb

## 2023-08-04 DIAGNOSIS — E039 Hypothyroidism, unspecified: Secondary | ICD-10-CM

## 2023-08-04 DIAGNOSIS — I10 Essential (primary) hypertension: Secondary | ICD-10-CM | POA: Diagnosis not present

## 2023-08-04 DIAGNOSIS — I25118 Atherosclerotic heart disease of native coronary artery with other forms of angina pectoris: Secondary | ICD-10-CM | POA: Diagnosis not present

## 2023-08-04 DIAGNOSIS — E785 Hyperlipidemia, unspecified: Secondary | ICD-10-CM

## 2023-08-04 DIAGNOSIS — I1A0 Resistant hypertension: Secondary | ICD-10-CM

## 2023-08-04 MED ORDER — DOXAZOSIN MESYLATE 1 MG PO TABS: 2.0000 mg | ORAL_TABLET | Freq: Every day | ORAL | 3 refills | Status: DC

## 2023-08-04 NOTE — Patient Instructions (Addendum)
Medication Instructions:  Your physician recommends that you continue on your current medications as directed. Please refer to the Current Medication list given to you today.  If we needed additional agent in the future for blood pressure control we could consider Isosorbide (Imdur) or Clonidine patch.  Follow-Up: At [facility], you and your health needs are our priority.  As part of our continuing mission to provide you with exceptional heart care, we have created designated Provider Care Teams.  These Care Teams include your primary Cardiologist (physician) and Advanced Practice Providers (APPs -  Physician Assistants and Nurse Practitioners) who all work together to provide you with the care you need, when you need it.  We recommend signing up for the patient portal called "MyChart".  Sign up information is provided on this After Visit Summary.  MyChart is used to connect with patients for Virtual Visits (Telemedicine).  Patients are able to view lab/test results, encounter notes, upcoming appointments, etc.  Non-urgent messages can be sent to your provider as well.   To learn more about what you can do with MyChart, go to [email or web address].    Your next appointment:   Follow up in 3-4 months with Dr. Cristal Deer or [name], NP

## 2023-08-04 NOTE — Progress Notes (Signed)
Cardiology Office Note:  .   Date:  08/04/2023  ID:  Regina Marsh, DOB 1946/09/21, MRN [id number] PCP: Shon Hale, MD  [facility] Providers Cardiologist:  Jodelle Red, MD    History of Present Illness: [name]   Regina Marsh is a 77 y.o. female with history of Sjogren's, hypertension, hyperlipidemia, coronary artery disease, GERD.    BP has been difficult to control given multiple medication intolerances.  Previously metanephrines, renin-aldosterone unremarkable.  Renal duplex 06/17/2022 with no stenosis.  Prior coronary calcium score 04/29/2021 of 27.7 placing her in the 42nd percentile for age, race, sex matched control.  Seen 03/15/23 at which time HCTZ 25 mg daily was continued and doxazosin 1 mg every evening resumed. Seen 05/02/23,  Doxazosin increased to 2mg  daily. At visit 06/02/23 Doxazosin increased to 3mg  every evening. Hydrochlorothiazide reduced from 25mg  to 12.5mg  due to more bothersome dryness with Sjogren's. Via subsequent MyChart message with home BP 140-160s Doxazosin increased to 4mg .   Presents today for follow up. Home BP cuff previously found to be accurate. Presents today with a myriad of concerns. Notes she only took the 4mg  of Doxazosin for 10 days then felt her blood pressure was escalating, "severe lightheadedness", reports eye problems where she felt her eyes were dry and burning. Reiterated that Doxazosin does not have diuretic effect. Notes hot flashes with blood pressure during episodes 205/84 and then a minute later as low as 123/93 which hade her feel lightheaded, dizzy. She has self reduced her Doxazosin to 2mg . Severe leg cramps requiring Gatorade or Pedialyte which then raises BP, tries to limit to 4-6 oz.Inquires if her thyroid medication is contraindicated with Doxazosin, reassured that they are safe to use together.   May 17 and Oct 17 woke up felt her legs were so weak she couldn't walk. Was concerned about pinched nerve. Spoke with Dr.  York Ram office concern for pinched nerve in lower back and complete PT. With residual symptoms, has upcoming visit with Dr. Allena Katz of neurology to discuss further. Walks better with cane. Feels her BP is higher related to these issues and prefers to see Dr. Allena Katz prior to making any changes in her antihypertensive regimen.   Home BP log: 10/8-10/24/24 with BP range 139/80-166/79. Average BP over those 4 readings of 153/77.  Previous hypertensives Spironolactone: rash Beta blockers: felt terrible (remotely) Amlodipine: lightheadedness Minoxidil: face/tongue swelling Lisinopril: lightheadedness, stomach pain Valsartan: lightheadedness, nausea, stomach pain Chlorthalidone: lightheadedness, nausea Hydralazine: joint pain and fatigue Doxazosin: 4mg  dose (tolerating 2mg )  ROS: Please see the history of present illness.    All other systems reviewed and are negative.   Studies Reviewed: .        Cardiac Studies & Procedures       ECHOCARDIOGRAM  ECHOCARDIOGRAM COMPLETE 02/04/2022  Narrative ECHOCARDIOGRAM REPORT    Patient Name:   Regina Marsh Date of Exam: 02/04/2022 Medical Rec #:  [id number]       Height:       62.0 in Accession #:    [id number]      Weight:       161.0 lb Date of Birth:  [birth year]        BSA:          1.743 m Patient Age:    75 years        BP:           152/96 mmHg Patient Gender: F  HR:           78 bpm. Exam Location:  [address]  Procedure: 2D Echo, Cardiac Doppler and Color Doppler  Indications:    I16.0 Hypertensive heart disease.  History:        Patient has no prior history of Echocardiogram examinations. Risk Factors:Hypertension.  Sonographer:    Daphine Deutscher RDCS Referring Phys: [id number] Shon Hale  IMPRESSIONS   1. Left ventricular ejection fraction, by estimation, is 60 to 65%. The left ventricle has normal function. The left ventricle has no regional wall motion abnormalities. There is mild concentric  left ventricular hypertrophy. Left ventricular diastolic parameters are consistent with Grade I diastolic dysfunction (impaired relaxation). Elevated left ventricular end-diastolic pressure. 2. Right ventricular systolic function is normal. The right ventricular size is normal. There is normal pulmonary artery systolic pressure. 3. Left atrial size was mildly dilated. 4. The mitral valve is normal in structure. Trivial mitral valve regurgitation. No evidence of mitral stenosis. 5. The aortic valve is tricuspid. Aortic valve regurgitation is not visualized. No aortic stenosis is present. 6. The inferior vena cava is normal in size with greater than 50% respiratory variability, suggesting right atrial pressure of 3 mmHg.  FINDINGS Left Ventricle: Left ventricular ejection fraction, by estimation, is 60 to 65%. The left ventricle has normal function. The left ventricle has no regional wall motion abnormalities. The left ventricular internal cavity size was normal in size. There is mild concentric left ventricular hypertrophy. Left ventricular diastolic parameters are consistent with Grade I diastolic dysfunction (impaired relaxation). Elevated left ventricular end-diastolic pressure.  Right Ventricle: The right ventricular size is normal. No increase in right ventricular wall thickness. Right ventricular systolic function is normal. There is normal pulmonary artery systolic pressure. The tricuspid regurgitant velocity is 2.16 m/s, and with an assumed right atrial pressure of 3 mmHg, the estimated right ventricular systolic pressure is 21.7 mmHg.  Left Atrium: Left atrial size was mildly dilated.  Right Atrium: Right atrial size was normal in size.  Pericardium: There is no evidence of pericardial effusion.  Mitral Valve: The mitral valve is normal in structure. Trivial mitral valve regurgitation. No evidence of mitral valve stenosis.  Tricuspid Valve: The tricuspid valve is normal in structure.  Tricuspid valve regurgitation is trivial. No evidence of tricuspid stenosis.  Aortic Valve: The aortic valve is tricuspid. Aortic valve regurgitation is not visualized. No aortic stenosis is present.  Pulmonic Valve: The pulmonic valve was normal in structure. Pulmonic valve regurgitation is trivial. No evidence of pulmonic stenosis.  Aorta: The aortic root is normal in size and structure.  Venous: The inferior vena cava is normal in size with greater than 50% respiratory variability, suggesting right atrial pressure of 3 mmHg.  IAS/Shunts: No atrial level shunt detected by color flow Doppler.   LEFT VENTRICLE PLAX 2D LVIDd:         4.60 cm   Diastology LVIDs:         3.10 cm   LV e' medial:    4.13 cm/s LV PW:         1.20 cm   LV E/e' medial:  17.2 LV IVS:        1.20 cm   LV e' lateral:   3.59 cm/s LVOT diam:     1.70 cm   LV E/e' lateral: 19.8 LV SV:         56 LV SV Index:   32 LVOT Area:     2.27 cm  RIGHT VENTRICLE             IVC RV Basal diam:  3.20 cm     IVC diam: 1.40 cm RV S prime:     15.50 cm/s TAPSE (M-mode): 2.3 cm  LEFT ATRIUM             Index        RIGHT ATRIUM           Index LA diam:        5.40 cm 3.10 cm/m   RA Area:     10.60 cm LA Vol (A2C):   31.2 ml 17.90 ml/m  RA Volume:   22.30 ml  12.79 ml/m LA Vol (A4C):   52.5 ml 30.11 ml/m LA Biplane Vol: 41.6 ml 23.86 ml/m AORTIC VALVE LVOT Vmax:   118.00 cm/s LVOT Vmean:  78.050 cm/s LVOT VTI:    0.248 m  AORTA Ao Root diam: 3.50 cm Ao Asc diam:  3.00 cm  MITRAL VALVE                TRICUSPID VALVE MV Area (PHT): 3.26 cm     TR Peak grad:   18.7 mmHg MV Decel Time: 233 msec     TR Vmax:        216.00 cm/s MV E velocity: 71.00 cm/s MV A velocity: 112.00 cm/s  SHUNTS MV E/A ratio:  0.63         Systemic VTI:  0.25 m Systemic Diam: 1.70 cm  Chilton Si MD Electronically signed by Chilton Si MD Signature Date/Time: 02/04/2022/4:05:42 PM    Final    MONITORS  LONG TERM  MONITOR (3-14 DAYS) 09/09/2020  Narrative 14 days of data recorded on Zio monitor. Patient had a min HR of 45 bpm, max HR of 184 bpm, and avg HR of 68 bpm. Predominant underlying rhythm was Sinus Rhythm. No VT, atrial fibrillation, high degree block, or pauses noted. 4 Supraventricular Tachycardia runs occurred, the run with the fastest interval lasting 6 beats with a max rate of 184 bpm, the longest lasting 11 beats with an avg rate of 109 bpm. Isolated atrial and ventricular ectopy was rare (<1%). There were 19 triggered events. These are all sinus rhythm based, 3 with PAC and 1 with PVC.   CT SCANS  CT CARDIAC SCORING (SELF PAY ONLY) 05/07/2021  Addendum 05/07/2021  2:18 PM ADDENDUM REPORT: 05/07/2021 14:15  CLINICAL DATA:  Cardiovascular Disease Risk stratification  EXAM: Coronary Calcium Score  TECHNIQUE: A gated, non-contrast computed tomography scan of the heart was performed using 3mm slice thickness. Axial images were analyzed on a dedicated workstation. Calcium scoring of the coronary arteries was performed using the Agatston method.  FINDINGS: Coronary arteries: Normal origins.  Coronary Calcium Score:  Left main: 0  Left anterior descending artery: 1.42  Left circumflex artery: 0  Right coronary artery: 26.3  Total: 27.7  Percentile: 42nd  Pericardium: Normal.  Ascending Aorta: Normal caliber.  Non-cardiac: See separate report from [facility].  IMPRESSION: Coronary calcium score of 27.7. This was 42nd percentile for age-, race-, and sex-matched controls.  RECOMMENDATIONS: Coronary artery calcium (CAC) score is a strong predictor of incident coronary heart disease (CHD) and provides predictive information beyond traditional risk factors. CAC scoring is reasonable to use in the decision to withhold, postpone, or initiate statin therapy in intermediate-risk or selected borderline-risk asymptomatic adults (age 87-75 years and LDL-C >=70 to <190  mg/dL) who do not have diabetes or established atherosclerotic  cardiovascular disease (ASCVD).* In intermediate-risk (10-year ASCVD risk >=7.5% to <20%) adults or selected borderline-risk (10-year ASCVD risk >=5% to <7.5%) adults in whom a CAC score is measured for the purpose of making a treatment decision the following recommendations have been made:  If CAC=0, it is reasonable to withhold statin therapy and reassess in 5 to 10 years, as long as higher risk conditions are absent (diabetes mellitus, family history of premature CHD in first degree relatives (males <55 years; females <65 years), cigarette smoking, or LDL >=190 mg/dL).  If CAC is 1 to 99, it is reasonable to initiate statin therapy for patients >=62 years of age.  If CAC is >=100 or >=75th percentile, it is reasonable to initiate statin therapy at any age.  Cardiology referral should be considered for patients with CAC scores >=400 or >=75th percentile.  *2018 AHA/ACC/AACVPR/AAPA/ABC/ACPM/ADA/AGS/APhA/ASPC/NLA/PCNA Guideline on the Management of Blood Cholesterol: A Report of the American College of Cardiology/American Heart Association Task Force on Clinical Practice Guidelines. J Am Coll Cardiol. 2019;73(24):3168-3209.  Zoila Shutter, MD   Electronically Signed By: Chrystie Nose M.D. On: 05/07/2021 14:15  Narrative EXAM: OVER-READ INTERPRETATION  CT CHEST  The following report is an over-read performed by radiologist Dr. Richarda Overlie of [facility] on 05/07/2021. This over-read does not include interpretation of cardiac or coronary anatomy or pathology. The coronary calcium score interpretation by the cardiologist is attached.  COMPARISON:  None.  FINDINGS: Vascular: Heart size is prominent. No significant pericardial fluid. Normal caliber of the visualized thoracic aorta.  Mediastinum/Nodes: Small to moderate sized hiatal hernia. There may be trace fluid or edema along the right side  of the distal esophagus near the hiatal hernia.  Lungs/Pleura: 5 mm nodule in the right lower lobe on sequence 4, image 32. Additional small pleural-based nodules. Few densities in the medial right lower lobe likely represent atelectasis. No large pleural effusions. No significant airspace disease or consolidation.  Upper Abdomen: Images of upper abdomen are unremarkable.  Musculoskeletal: Multilevel degenerative disc and endplate changes in thoracic spine.  IMPRESSION: 1. Small to moderate sized hiatal hernia. There may be trace fluid or edema along the right side of the distal esophagus which is nonspecific. 2. Small pulmonary nodules, largest measuring 5 mm in the right lower lobe. These small pulmonary nodules are indeterminate. No follow-up needed if patient is low-risk (and has no known or suspected primary neoplasm). Non-contrast chest CT can be considered in 12 months if patient is high-risk. This recommendation follows the consensus statement: Guidelines for Management of Incidental Pulmonary Nodules Detected on CT Images: From the Fleischner Society 2017; Radiology 2017; 284:228-243.  Electronically Signed: By: Richarda Overlie M.D. On: 05/07/2021 10:05          Risk Assessment/Calculations:     HYPERTENSION CONTROL Vitals:   08/04/23 0851 08/04/23 0906  BP: (!) 200/90 (!) 153/77    The patient's blood pressure is elevated above target today.  In order to address the patient's elevated BP: Blood pressure will be monitored at home to determine if medication changes need to be made.; Follow up with general cardiology has been recommended. (Declines additional medication changes today.)          Physical Exam:   VS:  BP (!) 153/77 Comment: average home BP readings  Pulse 82   Ht 5\' 2"  (1.575 m)   Wt 173 lb 6.4 oz (78.7 kg)   SpO2 98%   BMI 31.72 kg/m    Wt Readings from Last 3  Encounters:  08/04/23 173 lb 6.4 oz (78.7 kg)  06/02/23 174 lb 8 oz (79.2 kg)   05/02/23 173 lb 4.8 oz (78.6 kg)    Vitals:   08/04/23 0851 08/04/23 0906  BP: (!) 200/90 (!) 153/77 Comment: average home BP readings  Pulse: 82   Height: 5\' 2"  (1.575 m)   Weight: 173 lb 6.4 oz (78.7 kg)   SpO2: 98%   BMI (Calculated): 31.71     GEN: Well nourished, well developed in no acute distress NECK: No JVD; No carotid bruits CARDIAC: RRR, no murmurs, rubs, gallops RESPIRATORY:  Clear to auscultation without rales, wheezing or rhonchi  ABDOMEN: Soft, non-tender, non-distended EXTREMITIES:  No edema; No deformity   ASSESSMENT AND PLAN: .    HTN - [place] hypertension superimposed on hypertension.  Multiple prior intolerances detailed above.  Average home BP 153/77, home BP cuff previously found to be accurate. Recent issues with weakness with her legs, upcoming visit with Dr. Allena Katz of neurology. She is concerned about recurrent pinched nerve and attributes this to elevated BP. She has self reduced Doxazosin to 2mg  every evening as felt higher doses were making her BP higher. Politely declines medication changes today preferring to follow up with Dr. Allena Katz prior to making changes. Discussed possible trial of Doxazosin 3mg  every evening, addition of Imdur, or addition of Clonidine patch at follow up.  Previous secondary workup including renal aldosterone, metanephrines, renal duplex unremarkable.   Has previously  renal denervation as she is not interested in procedures at this time.   Hypothyroidism - Started low dose levothyroxine with improvement in TSH. Continue to follow with PCP.  Reiterated no interaction between Doxazosin and levothyroxine.  CAD / HLD - Stable with no anginal symptoms. No indication for ischemic evaluation.  GDMT Aspirin. Previously did not tolerate statin, beta blocker. Has politely declined further lipid lowering agents. Recommend aiming for 150 minutes of moderate intensity activity per week and following a heart healthy diet.         Dispo: follow  up in 3-4 months  Signed, Alver Sorrow, NP

## 2023-08-15 ENCOUNTER — Ambulatory Visit: Payer: Medicare Other | Admitting: Neurology

## 2023-08-15 ENCOUNTER — Encounter: Payer: Self-pay | Admitting: Neurology

## 2023-08-15 VITALS — BP 194/79 | HR 88 | Ht 62.0 in | Wt 173.0 lb

## 2023-08-15 DIAGNOSIS — R292 Abnormal reflex: Secondary | ICD-10-CM

## 2023-08-15 DIAGNOSIS — R2681 Unsteadiness on feet: Secondary | ICD-10-CM

## 2023-08-15 NOTE — Progress Notes (Signed)
Follow-up Visit   Date: 08/15/2023    Regina Marsh MRN: [id number] DOB: 12/08/1945    Regina Marsh is a 77 y.o. right-handed Caucasian female with refractory hypertension, Sjogren'sdisease, hypothyroidism, hypertension, and GERD, intracranial stenosis  returning to the clinic for follow-up of bilateral leg weakness.  The patient was accompanied to the clinic by husband who also provides collateral information.    IMPRESSION/PLAN: Spells of gait instability ?freezing of gait vs gait apraxia.  Exam shows brisk patella reflexes, so I will check MRI of the thoracic and lumbar spine.  There is no focal weakness, bradykinesia, or rigidity on her exam.  If spine imaging is unremarkable, may consider MRI brain.    Further recommendations pending results.   --------------------------------------------- History of present illness: On 08/16/2021, she went to the ER because of having difficulty walking, described as leg weakness and sensation that her legs would buckle. She did not fall, but felt like her legs were jello. She ambulated with her husband's support.  There was no leg pain, numbness, or tingling.  At the ER, she underwent CTA head and neck which showed R M1 occlusion with reconstitution distally, moderate bilateral vertebral stenosis, worseon the left, and left A1 stenosis. No significant stenosis of the carotid arteries (LICA < 50%).  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 She was evaluated by neurohospitalist who recommended she start aspirin 81mg  daily and follow-up with neuropathy. Of note, her orthostatic blood pressure was positive in the ER (230/81 supine, 208/95 sitting, 199/92 standing). Her BP has been very labile over the past year. Today, it is very elevated 210/104.  She denies SOB or chest pain.     UPDATE 08/15/2023:  She is here for with two episodes of leg buckling.  In May it lasted 5-hours and in October, it lasted a few days.  She reports waking up and is able to stand  up on the side of the bed, but she is unable to take a step because she feels as if her legs will buckle and she may fall. She was able to walk with help with with a cane.  She noticed that the same symptoms would occur if she say down again.  She contacted Dr. York Ram office because she had cervical surgery in January 2023.  Repeat MRI cervical spine did not show any explanation for her symptoms.  She was referred to to PT and was doing well, but another episode in October which lasted two days.  She is doing well now.  She has some low back pain and leg cramps.    Medications:  Current Outpatient Medications on File Prior to Visit  Medication Sig Dispense Refill   acetaminophen (TYLENOL) 325 MG tablet Take 650 mg by mouth as needed for moderate pain, mild pain or headache.     aspirin EC 81 MG tablet Take 81 mg by mouth daily. Swallow whole.     Bismuth Subsalicylate (PEPTO BISMOL PO) Take by mouth.     Cholecalciferol (VITAMIN D3) 50 MCG (2000 UT) capsule Take 4,000 Units by mouth daily. Take 2 tablets daily     cyanocobalamin (VITAMIN B12) 1000 MCG tablet Take 1,000 mcg by mouth daily.     D-MANNOSE PO Take 50 mg by mouth daily.     diphenhydrAMINE (BENADRYL) 25 MG tablet Take 0.5 tablets by mouth daily as needed (allergies).     doxazosin (CARDURA) 1 MG tablet Take 2 tablets (2 mg total) by mouth at bedtime. 180 tablet 3  famotidine (PEPCID) 20 MG tablet Take 1 tablet (20 mg total) by mouth 2 (two) times daily. 60 tablet 5   hydrochlorothiazide (HYDRODIURIL) 25 MG tablet Take 0.5 tablets (12.5 mg total) by mouth daily. 45 tablet 3   L-THEANINE PO Take 1 tablet by mouth daily as needed (pain).     Lactase (LACTAID PO) Take 1-3 tablets by mouth 3 (three) times daily as needed (when consuming milk products).     Menatetrenone (VITAMIN K2) 100 MCG TABS Take 100 mcg by mouth daily.     Polyethyl Glycol-Propyl Glycol (SYSTANE) 0.4-0.3 % GEL ophthalmic gel Place 1 Application into both eyes. Once  daily at bedtime.     Propylene Glycol (SYSTANE BALANCE OP) Place 1 drop into both eyes 3 (three) times daily as needed (dry eyes).     PSYLLIUM PO Take 1 capsule by mouth daily at 12 noon.     Simethicone (GAS-X PO) Take 1 tablet by mouth daily as needed (flatulance).     SYNTHROID 50 MCG tablet Take 50 mcg by mouth daily.     Turmeric Curcumin 500 MG CAPS Take 500 mg by mouth daily in the afternoon.     No current facility-administered medications on file prior to visit.    Allergies:  Allergies  Allergen Reactions   Aleve [Naproxen] Other (See Comments)    Causes severe bruising   Allegra [Fexofenadine] Other (See Comments)    Abdominal pain   Amlodipine Other (See Comments)    lightheadedness   Cetirizine Hcl Other (See Comments)    Made her hyper - caused insomnia   Chlorthalidone Other (See Comments)    lightheadedness   Hydralazine Nausea And Vomiting    Leg pain, joint pain   Lactose Intolerance (Gi) Diarrhea   Lisinopril Swelling    Caused eyes to swell   Loratadine Other (See Comments)    Made her hyper - caused insomnia   Pravastatin Other (See Comments)    Leg pain, loss of memory   Simvastatin Other (See Comments)    Leg pain, loss of memory   Sulfa Antibiotics Nausea And Vomiting   Valsartan Swelling and Cough    Myalgias,  tongue swelling   African Mango [Irvingia Gabonensis] Swelling, Rash and Other (See Comments)    Blisters in mouth and tongue swelling   Penicillins Swelling and Rash    Tongue swelling   Spironolactone Rash    Vital Signs:  BP (!) 194/79   Pulse 88   Ht 5\' 2"  (1.575 m)   Wt 173 lb (78.5 kg)   SpO2 98%   BMI 31.64 kg/m    Neurological Exam: MENTAL STATUS including orientation to time, place, person, recent and remote memory, attention span and concentration, language, and fund of knowledge is normal.  Speech is not dysarthric.  CRANIAL NERVES:  No visual field defects.  Pupils equal round and reactive to light.  Normal  conjugate, extra-ocular eye movements in all directions of gaze.  Mild left ptosis.  Face is symmetric. Palate elevates symmetrically.  Tongue is midline.  MOTOR:  Motor strength is 5/5 in all extremities.  No atrophy, fasciculations or abnormal movements.  No pronator drift.  Tone is normal.    MSRs:  Reflexes are 2+/4, except 3+/4 bilateral patella.  Plantars are downgoing. .  SENSORY:  Intact to vibration and temperature.  COORDINATION/GAIT:  Normal finger-to- nose-finger.  Intact rapid alternating movements bilaterally.  Gait narrow based and stable. Stressed and tandem gait intact.   Data:  CTA head and neck wwo contrast 08/16/2021: There is no acute intracranial hemorrhage or evidence of acute infarction. ASPECT score is 10.   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   Intracranial atherosclerosis. Moderate stenosis right vertebral artery and moderate to marked stenosis left vertebral artery just beyond PICA origin. There is stenosis at the left PICA origin. The posterior cerebral arteries are poorly visualized and likely diffusely irregular and stenotic. Probable early left MCA bifurcation with superimposed stenosis. Stenosis of proximal left A1 ACA.   No hemodynamically significant stenosis in the neck. Noncalcified plaque at the left ICA origin with less than 50% stenosis.   MRI brain wwo contrast 08/16/2021:  No acute infarction. Two small meningiomas.  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 AP diameter of the canal in the midline is 8.4 mm in there is no cord compression. There is moderate foraminal narrowing at this level which could possibly have some compressive effect upon either C6 nerve.   MRI cervical spine 04/06/2023: 1. Anterior cervical fusion at C5-6 without failure or complication. No foraminal or central canal stenosis. Mild  bilateral facet arthropathy. 2. At C2-3 there is a tiny central disc protrusion. Mild right foraminal stenosis. 3. At C3-4 there is moderate left and mild right facet arthropathy. Moderate left foraminal stenosis. 4. At C4-5 there is a mild broad-based disc bulge. Mild bilateral facet arthropathy. 5. At C6-7 there is a mild broad-based disc bulge. Moderate right facet arthropathy. 6. No acute osseous injury of the cervical spine.     Thank you for allowing me to participate in patient's care.  If I can answer any additional questions, I would be pleased to do so.    Sincerely,    Shaiann Mcmanamon K. Allena Katz, DO

## 2023-08-24 ENCOUNTER — Encounter: Payer: Self-pay | Admitting: Neurology

## 2023-08-30 ENCOUNTER — Ambulatory Visit
Admission: RE | Admit: 2023-08-30 | Discharge: 2023-08-30 | Disposition: A | Discharge status: Home / Self Care | Payer: Medicare Other | Source: Ambulatory Visit | Attending: Neurology | Admitting: Neurology

## 2023-08-30 DIAGNOSIS — R292 Abnormal reflex: Secondary | ICD-10-CM

## 2023-08-30 DIAGNOSIS — M47814 Spondylosis without myelopathy or radiculopathy, thoracic region: Secondary | ICD-10-CM | POA: Diagnosis not present

## 2023-08-30 DIAGNOSIS — M48061 Spinal stenosis, lumbar region without neurogenic claudication: Secondary | ICD-10-CM | POA: Diagnosis not present

## 2023-08-30 DIAGNOSIS — R2681 Unsteadiness on feet: Secondary | ICD-10-CM

## 2023-09-18 ENCOUNTER — Other Ambulatory Visit: Payer: Self-pay

## 2023-09-18 DIAGNOSIS — R2681 Unsteadiness on feet: Secondary | ICD-10-CM

## 2023-09-18 DIAGNOSIS — R292 Abnormal reflex: Secondary | ICD-10-CM

## 2023-10-02 ENCOUNTER — Encounter: Payer: Self-pay | Admitting: Neurology

## 2023-10-06 ENCOUNTER — Other Ambulatory Visit: Payer: Medicare Other

## 2023-10-09 ENCOUNTER — Ambulatory Visit
Admission: RE | Admit: 2023-10-09 | Discharge: 2023-10-09 | Disposition: A | Discharge status: Home / Self Care | Payer: Medicare Other | Source: Ambulatory Visit | Attending: Neurology | Admitting: Neurology

## 2023-10-09 ENCOUNTER — Other Ambulatory Visit: Payer: Medicare Other

## 2023-10-09 DIAGNOSIS — R2681 Unsteadiness on feet: Secondary | ICD-10-CM

## 2023-10-09 DIAGNOSIS — R292 Abnormal reflex: Secondary | ICD-10-CM

## 2023-10-09 DIAGNOSIS — R9089 Other abnormal findings on diagnostic imaging of central nervous system: Secondary | ICD-10-CM | POA: Diagnosis not present

## 2023-10-09 DIAGNOSIS — R2689 Other abnormalities of gait and mobility: Secondary | ICD-10-CM | POA: Diagnosis not present

## 2023-10-23 DIAGNOSIS — H40013 Open angle with borderline findings, low risk, bilateral: Secondary | ICD-10-CM | POA: Diagnosis not present

## 2023-10-23 DIAGNOSIS — H2513 Age-related nuclear cataract, bilateral: Secondary | ICD-10-CM | POA: Diagnosis not present

## 2023-11-06 ENCOUNTER — Ambulatory Visit (HOSPITAL_BASED_OUTPATIENT_CLINIC_OR_DEPARTMENT_OTHER): Payer: Medicare Other | Admitting: Cardiology

## 2023-11-06 ENCOUNTER — Encounter (HOSPITAL_BASED_OUTPATIENT_CLINIC_OR_DEPARTMENT_OTHER): Payer: Self-pay | Admitting: Cardiology

## 2023-11-06 VITALS — BP 143/68 | HR 80 | Ht 62.0 in | Wt 171.1 lb

## 2023-11-06 DIAGNOSIS — R0989 Other specified symptoms and signs involving the circulatory and respiratory systems: Secondary | ICD-10-CM

## 2023-11-06 DIAGNOSIS — I10 Essential (primary) hypertension: Secondary | ICD-10-CM | POA: Diagnosis not present

## 2023-11-06 DIAGNOSIS — I2510 Atherosclerotic heart disease of native coronary artery without angina pectoris: Secondary | ICD-10-CM | POA: Diagnosis not present

## 2023-11-06 DIAGNOSIS — E782 Mixed hyperlipidemia: Secondary | ICD-10-CM

## 2023-11-06 DIAGNOSIS — Z789 Other specified health status: Secondary | ICD-10-CM | POA: Diagnosis not present

## 2023-11-06 MED ORDER — PROPRANOLOL HCL 10 MG PO TABS: 10.0000 mg | ORAL_TABLET | Freq: Three times a day (TID) | ORAL | 11 refills | Status: DC | PRN

## 2023-11-06 NOTE — Patient Instructions (Addendum)
Medication Instructions:  Your physician has recommended you make the following change in your medication:   START Propanolol three times a day as needed (elevated blood pressure from triggers)  *If you need a refill on your cardiac medications before your next appointment, please call your pharmacy*   Follow-Up: At [facility], you and your health needs are our priority.  As part of our continuing mission to provide you with exceptional heart care, we have created designated Provider Care Teams.  These Care Teams include your primary Cardiologist (physician) and Advanced Practice Providers (APPs -  Physician Assistants and Nurse Practitioners) who all work together to provide you with the care you need, when you need it.  We recommend signing up for the patient portal called "MyChart".  Sign up information is provided on this After Visit Summary.  MyChart is used to connect with patients for Virtual Visits (Telemedicine).  Patients are able to view lab/test results, encounter notes, upcoming appointments, etc.  Non-urgent messages can be sent to your provider as well.   To learn more about what you can do with MyChart, go to [email or web address].    Your next appointment:   3 month(s) with [name], NP 6 month(s) with Dr. Cristal Deer   Other Instructions

## 2023-11-06 NOTE — Progress Notes (Signed)
Cardiology Office Note:  .   Date:  11/06/2023  ID:  Regina Marsh, DOB 1946-03-02, MRN [id number] PCP: Regina Hale, MD  Regina Marsh Providers Cardiologist:  Regina Red, MD {  History of Present Illness: Regina Marsh Kitchen   Regina Marsh is a 78 y.o. female with a hx of Sjogren's, labile/resistant hypertension, hyperlipidemia, and GERD, here for follow up. I met her 06/23/20 as a new consult at the request of Regina Marsh, * for the evaluation and management of resistant hypertension.   Cardiac history:  metanephrines that are within normal limits (metanephrine 48, normetanephrine 141). ER note from 03/27/20. Noted to have elevated blood pressure after she had nasal packing for a nosebleed. No associated symptoms. BP noted as 214/91 at that time. Long history of drug intolerances, see summary below. Renal duplex 06/2022 without stenosis. Echo 01/2022 EF 60-65%, mild LVH, grade 1 DD,  normal EV, no significant valve disease. Ca score 2022 27.7 (42nd %ile). Monitor 2021 with rare brief SVT, 19 events were sinus (3 with PAC or PVC). Amb BP monitoring 2021 showed hypertension throughout study, mean 176/79, no significant drop with sleep.  Today: Most recently seen by Regina Shields, NP on 08/04/23. Was tried on doxazosin 4 mg, tried for 10 days but noted worsening BP and severe lightheadedness, eye dryness. Decreased to 2 mg dose. Also noted leg weakness, seen by Dr. Allena Katz 08/15/23, noted gait instability and hyperreflexia, had MRI spine and brain that were stable/no new findings to explain her symptoms. She still has intermittent episodes of leg weakness but cannot determine a pattern to these episodes.  Having a cluster migraine right now. Would get a hot flash, feel her heart race, then would feel lightheaded. Took blood pressure during event, was 203 systolic, then took a minute later when she felt better, was 123 systolic. Stopped taking synthroid about 8 days ago, feels that hot  flashes and migraines are better since stopping. Plans to discuss with Dr. Chanetta Marshall.  Having dental issues, son is her dentist. Sent letter questioning whether this is dysautonomia. We discussed this at length today.   Reviewed her BP meds. Hydrochlorothiazide exacerbated Sjogren's. Did not tolerate higher doses of doxazosin, tolerates only 2 mg dose and no higher. Reviewed home logs. Most 140s-160s systolic, occasionally 180s. Lowest 143. Diastolic mostly in the 70s.Hrs mostly around 60.  ROS: Denies chest pain, shortness of breath at rest or with normal exertion. No PND, orthopnea, LE edema or unexpected weight gain. No syncope or palpitations. ROS otherwise negative except as noted.   Studies Reviewed: Regina Marsh Kitchen    EKG:       Physical Exam:   VS:  BP (!) 143/68 Comment: home reading from log  Pulse 80   Ht 5\' 2"  (1.575 m)   Wt 171 lb 1.6 oz (77.6 kg)   SpO2 95%   BMI 31.29 kg/m    Wt Readings from Last 3 Encounters:  11/06/23 171 lb 1.6 oz (77.6 kg)  08/15/23 173 lb (78.5 kg)  08/04/23 173 lb 6.4 oz (78.7 kg)    GEN: Well nourished, well developed in no acute distress HEENT: Normal, moist mucous membranes NECK: No JVD CARDIAC: regular rhythm, normal S1 and S2, no rubs or gallops. No murmur. VASCULAR: Radial and DP pulses 2+ bilaterally. No carotid bruits RESPIRATORY:  Clear to auscultation without rales, wheezing or rhonchi  ABDOMEN: Soft, non-tender, non-distended MUSCULOSKELETAL:  Ambulates independently SKIN: Warm and dry, no edema NEUROLOGIC:  Alert and oriented x 3. No focal  neuro deficits noted. PSYCHIATRIC:  Normal affect    ASSESSMENT AND PLAN: .    Hypertension, labile -see extensive prior discussion. Today, reports BP much better when she held her thyroid medication. We discussed options at length. We discussed both that her baseline BP is above goal (hence needing standing medications) and that she has very high spikes with triggers (likely sympathetic  driven) -stress, pain, illness, hot flashes, migraines are triggers. -has tolerated only a few meds, see below. Currently on hydrochlorothiazide 12.5 mg daily and doxazosin 2 mg daily -doing better off synthroid, she will discuss options with Dr. Chanetta Marshall -have previously used hydralazine 10 mg q6 hours PRN for systolic BP >160 -appreciate assistance from advanced hypertension clinic -discussed PRN propranolol, see below   Intolerances: Spironolactone: rash beta blockers: felt terrible (remotely). Discussed again today. Could use short acting like propranolol PRN, see above. 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  -see prior discussion, concern for hormonal dumping, long Covid, dysautonomia. These seem more related to sympathetic triggers, see discussion above re: beta blockers.   Mixed hyperlipidemia Coronary calcification -She has not tolerated statins. See prior extensive discussions re: nonstatin options, she declines. -calcium score 05/07/21 showed Ca score=27.7  CV risk counseling and prevention -recommend heart healthy/Mediterranean diet, with whole grains, fruits, vegetable, fish, lean meats, nuts, and olive oil. Limit salt. -recommend moderate walking, 3-5 times/week for 30-50 minutes each session. Aim for at least 150 minutes.week. Goal should be pace of 3 miles/hours, or walking 1.5 miles in 30 minutes -recommend avoidance of tobacco products. Avoid excess alcohol.  Dispo: 3 mos with Regina Marsh, 6 mos with  me  Total time of encounter: I spent 50 minutes dedicated to the care of this patient on the date of this encounter to include pre-visit review of records, face-to-face time with the patient discussing conditions above, and clinical documentation with the electronic health record. We specifically spent time today discussing symptoms, dysautonomia spectrum, triggers/pattern with sympathetic triggers, options for management, signs/symptoms to watch for.   Signed, Regina Red, MD   Regina Red, MD, PhD, [facility]  [facility]  [place]  Heart & Vascular at [facility] at [facility] [address], [address] [place], [place] [address] [phone]

## 2023-12-06 NOTE — Progress Notes (Signed)
 [facility] [address] [place] [address] Phone: [phone] Subjective:   Regina Marsh, am serving as a scribe for Dr. Antoine Primas.  I'm seeing this patient by the request  of:  Shon Hale, MD  CC: back and leg pain follow up   BJY:[id number]  Regina Marsh is a 78 y.o. female coming in with complaint of back and leg pain. Having trouble sometime walking. She has Sjogren's. Nove 2022 found out she had pinched nerve. 17 months after surgery started having headaches and leg pains again. Recent event Feb 14 of this year felt wobbly, has been having muscles cramps most on the left side. Tingling in the face as well. The bouts of instability has increased in duration.  Patient has had workup recently as well and MRI of the brain January 10 to 20, 2025.  Nothing specific at that point.  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   REVERSE SHOULDER ARTHROPLASTY Right 02/09/2022   Procedure: REVERSE SHOULDER ARTHROPLASTY;  Surgeon: Bjorn Pippin, MD;  Location: [facility];  Service: Orthopedics;  Laterality: Right;   SALIVARY GLAND SURGERY Left 1968   TONSILLECTOMY AND ADENOIDECTOMY  1951   Social History   Socioeconomic History   Marital status: Married    Spouse name: Not on file   Number of children: 1   Years of education: Not on file   Highest education level:  Not on file  Occupational History   Occupation: retired Runner, broadcasting/film/video  Tobacco Use   Smoking status: Never   Smokeless tobacco: Never  Vaping Use   Vaping status: Never Used  Substance and Sexual Activity   Alcohol use: Yes    Comment: rare   Drug use: Never   Sexual activity: Not Currently    Birth control/protection: Post-menopausal  Other Topics Concern   Not on file  Social History Narrative   Right handed    Lives in a two story home    Social Drivers of Health   Financial Resource Strain: Not on file  Food Insecurity: Not on file  Transportation Needs: Not on file  Physical Activity: Not on file  Stress: Not on file  Social Connections: Not on file   Allergies  Allergen Reactions   Aleve [Naproxen] Other (See Comments)    Causes severe bruising   Allegra [Fexofenadine] Other (See Comments)    Abdominal pain   Amlodipine Other (See Comments)    lightheadedness   Cetirizine Hcl Other (See Comments)    Made her hyper - caused insomnia   Chlorthalidone Other (See Comments)    lightheadedness   Hydralazine Nausea And Vomiting    Leg pain, joint pain   Lactose Intolerance (Gi) Diarrhea   Lisinopril Swelling  Caused eyes to swell   Loratadine Other (See Comments)    Made her hyper - caused insomnia   Pravastatin Other (See Comments)    Leg pain, loss of memory   Simvastatin Other (See Comments)    Leg pain, loss of memory   Sulfa Antibiotics Nausea And Vomiting   Valsartan Swelling and Cough    Myalgias,  tongue swelling   African Mango [Irvingia Gabonensis] Swelling, Rash and Other (See Comments)    Blisters in mouth and tongue swelling   Penicillins Swelling and Rash    Tongue swelling   Spironolactone Rash   Family History  Problem Relation Age of Onset   Hypertension Mother    Heart attack Mother    Stroke Father    Leukemia Father    Allergic rhinitis Neg Hx    Angioedema Neg Hx    Asthma Neg Hx    Atopy Neg Hx    Eczema Neg Hx     Immunodeficiency Neg Hx    Urticaria Neg Hx     Current Outpatient Medications (Endocrine & Metabolic):    SYNTHROID 50 MCG tablet, Take 50 mcg by mouth daily. (Patient not taking: Reported on 11/06/2023)  Current Outpatient Medications (Cardiovascular):    doxazosin (CARDURA) 1 MG tablet, Take 2 tablets (2 mg total) by mouth at bedtime.   hydrochlorothiazide (HYDRODIURIL) 25 MG tablet, Take 0.5 tablets (12.5 mg total) by mouth daily.   propranolol (INDERAL) 10 MG tablet, Take 1 tablet (10 mg total) by mouth 3 (three) times daily as needed (elevated blood pressure from triggers).  Current Outpatient Medications (Respiratory):    diphenhydrAMINE (BENADRYL) 25 MG tablet, Take 0.5 tablets by mouth daily as needed (allergies).  Current Outpatient Medications (Analgesics):    acetaminophen (TYLENOL) 325 MG tablet, Take 650 mg by mouth as needed for moderate pain, mild pain or headache.   aspirin EC 81 MG tablet, Take 81 mg by mouth daily. Swallow whole.  Current Outpatient Medications (Hematological):    cyanocobalamin (VITAMIN B12) 1000 MCG tablet, Take 1,000 mcg by mouth daily.  Current Outpatient Medications (Other):    Bismuth Subsalicylate (PEPTO BISMOL PO), Take by mouth.   Cholecalciferol (VITAMIN D3) 50 MCG (2000 UT) capsule, Take 4,000 Units by mouth daily. Take 2 tablets daily   D-MANNOSE PO, Take 50 mg by mouth daily.   famotidine (PEPCID) 20 MG tablet, Take 1 tablet (20 mg total) by mouth 2 (two) times daily.   L-THEANINE PO, Take 1 tablet by mouth daily as needed (pain).   Lactase (LACTAID PO), Take 1-3 tablets by mouth 3 (three) times daily as needed (when consuming milk products).   Menatetrenone (VITAMIN K2) 100 MCG TABS, Take 100 mcg by mouth daily.   Polyethyl Glycol-Propyl Glycol (SYSTANE) 0.4-0.3 % GEL ophthalmic gel, Place 1 Application into both eyes. Once daily at bedtime.   Propylene Glycol (SYSTANE BALANCE OP), Place 1 drop into both eyes 3 (three) times daily as  needed (dry eyes).   PSYLLIUM PO, Take 1 capsule by mouth daily at 12 noon.   Simethicone (GAS-X PO), Take 1 tablet by mouth daily as needed (flatulance).   Turmeric Curcumin 500 MG CAPS, Take 500 mg by mouth daily in the afternoon.   Reviewed prior external information including notes and imaging from  primary care provider As well as notes that were available from care everywhere and other healthcare systems.  Past medical history, social, surgical and family history all reviewed in electronic medical record.  No pertanent  information unless stated regarding to the chief complaint.   Review of Systems:  No  visual changes, nausea, vomiting, diarrhea, constipation, dizziness, abdominal pain, skin rash, fevers, chills, night sweats, weight loss, swollen lymph nodes,chest pain, shortness of breath, mood changes. POSITIVE muscle aches, body aches joint swelling, headaches  Objective  Blood pressure (!) 150/82, pulse (!) 106, height 5\' 2"  (1.575 m), weight 173 lb (78.5 kg), SpO2 97%.   General: No apparent distress alert and oriented x3 mood and affect normal, dressed appropriately.  HEENT: Pupils equal, extraocular movements intact  Respiratory: Patient's speak in full sentences and does not appear short of breath  Cardiovascular: No lower extremity edema, non tender, no erythema  Neck exam does have some significant loss of lordosis.  Some limited sidebending bilaterally.  Postsurgical changes noted of the neck on the left side. Patient does have some hyperreflexia in the upper extremities at the C7 it appears bilaterally.  Patient does have some difficulty going from a seated position.  Neurovascularly intact in the lower extremities.    Impression and Recommendations:    The above documentation has been reviewed and is accurate and complete Judi Saa, DO

## 2023-12-12 ENCOUNTER — Ambulatory Visit: Payer: Medicare Other | Admitting: Family Medicine

## 2023-12-12 ENCOUNTER — Encounter: Payer: Self-pay | Admitting: Family Medicine

## 2023-12-12 ENCOUNTER — Ambulatory Visit (INDEPENDENT_AMBULATORY_CARE_PROVIDER_SITE_OTHER)

## 2023-12-12 VITALS — BP 150/82 | HR 106 | Ht 62.0 in | Wt 173.0 lb

## 2023-12-12 DIAGNOSIS — M47812 Spondylosis without myelopathy or radiculopathy, cervical region: Secondary | ICD-10-CM | POA: Diagnosis not present

## 2023-12-12 DIAGNOSIS — M3500 Sicca syndrome, unspecified: Secondary | ICD-10-CM | POA: Diagnosis not present

## 2023-12-12 DIAGNOSIS — M2550 Pain in unspecified joint: Secondary | ICD-10-CM | POA: Diagnosis not present

## 2023-12-12 DIAGNOSIS — M542 Cervicalgia: Secondary | ICD-10-CM | POA: Diagnosis not present

## 2023-12-12 DIAGNOSIS — Z981 Arthrodesis status: Secondary | ICD-10-CM | POA: Diagnosis not present

## 2023-12-12 DIAGNOSIS — I10 Essential (primary) hypertension: Secondary | ICD-10-CM | POA: Diagnosis not present

## 2023-12-12 DIAGNOSIS — M5010 Cervical disc disorder with radiculopathy, unspecified cervical region: Secondary | ICD-10-CM

## 2023-12-12 LAB — COMPREHENSIVE METABOLIC PANEL
ALT: 14 U/L (ref 0–35)
AST: 15 U/L (ref 0–37)
Albumin: 4.4 g/dL (ref 3.5–5.2)
Alkaline Phosphatase: 70 U/L (ref 39–117)
BUN: 18 mg/dL (ref 6–23)
CO2: 29 meq/L (ref 19–32)
Calcium: 9.7 mg/dL (ref 8.4–10.5)
Chloride: 102 meq/L (ref 96–112)
Creatinine, Ser: 0.93 mg/dL (ref 0.40–1.20)
GFR: 58.87 mL/min — ABNORMAL LOW (ref >60.00)
Glucose, Bld: 115 mg/dL — ABNORMAL HIGH (ref 70–99)
Potassium: 3.2 meq/L — ABNORMAL LOW (ref 3.5–5.1)
Sodium: 141 meq/L (ref 135–145)
Total Bilirubin: 0.9 mg/dL (ref 0.2–1.2)
Total Protein: 7.6 g/dL (ref 6.0–8.3)

## 2023-12-12 LAB — CBC WITH DIFFERENTIAL/PLATELET
Basophils Absolute: 0.1 10*3/uL (ref 0.0–0.1)
Basophils Relative: 1.2 % (ref 0.0–3.0)
Eosinophils Absolute: 0.2 10*3/uL (ref 0.0–0.7)
Eosinophils Relative: 2.1 % (ref 0.0–5.0)
HCT: 37.6 % (ref 36.0–46.0)
Hemoglobin: 12.9 g/dL (ref 12.0–15.0)
Lymphocytes Relative: 19.1 % (ref 12.0–46.0)
Lymphs Abs: 1.5 10*3/uL (ref 0.7–4.0)
MCHC: 34.3 g/dL (ref 30.0–36.0)
MCV: 89 fl (ref 78.0–100.0)
Monocytes Absolute: 0.6 10*3/uL (ref 0.1–1.0)
Monocytes Relative: 7.5 % (ref 3.0–12.0)
Neutro Abs: 5.4 10*3/uL (ref 1.4–7.7)
Neutrophils Relative %: 70.1 % (ref 43.0–77.0)
Platelets: 249 10*3/uL (ref 150.0–400.0)
RBC: 4.22 Mil/uL (ref 3.87–5.11)
RDW: 14.4 % (ref 11.5–15.5)
WBC: 7.7 10*3/uL (ref 4.0–10.5)

## 2023-12-12 LAB — FERRITIN: Ferritin: 45.8 ng/mL (ref 10.0–291.0)

## 2023-12-12 LAB — IBC PANEL
Iron: 54 ug/dL (ref 42–145)
Saturation Ratios: 14.7 % — ABNORMAL LOW (ref 20.0–50.0)
TIBC: 368.2 ug/dL (ref 250.0–450.0)
Transferrin: 263 mg/dL (ref 212.0–360.0)

## 2023-12-12 LAB — VITAMIN D 25 HYDROXY (VIT D DEFICIENCY, FRACTURES): VITD: 45.23 ng/mL (ref 30.00–100.00)

## 2023-12-12 LAB — TSH: TSH: 2.2 u[IU]/mL (ref 0.35–5.50)

## 2023-12-12 LAB — C-REACTIVE PROTEIN: CRP: 1 mg/dL (ref 0.5–20.0)

## 2023-12-12 LAB — SEDIMENTATION RATE: Sed Rate: 44 mm/h — ABNORMAL HIGH (ref 0–30)

## 2023-12-12 LAB — URIC ACID: Uric Acid, Serum: 6.9 mg/dL (ref 2.4–7.0)

## 2023-12-12 LAB — VITAMIN B12: Vitamin B-12: 1246 pg/mL — ABNORMAL HIGH (ref 211–911)

## 2023-12-12 NOTE — Patient Instructions (Addendum)
 Labs today Stop the hydrochlorothiazide for now CoQ10 200mg  daily [facility] [phone] Call Today  When we receive your results we will contact you.  See you again in 6-8 weeks, but we will be talking before that

## 2023-12-12 NOTE — Assessment & Plan Note (Signed)
 Do believe that this could be potentially contributing to some of a problem as well.

## 2023-12-12 NOTE — Assessment & Plan Note (Signed)
 Known degenerative disc disease.  Discussed icing regimen and home exercises.  We discussed which activities to do and which ones to avoid.  Increase activity slowly otherwise.  Would like to get laboratory workup to make sure nothing else is potentially contributing to her symptoms such as B12 levels and other laboratory workup.  Discussed icing regimen and home exercises, which activities to do and which room to avoid.  We did also laboratory workup of the cervical and iliac again to make sure there is no loosening of the previous surgery or any adjacent segment disease.  Would like to do it with contrast as well to check the vascularity with patient having significant labile hypertension.

## 2023-12-13 ENCOUNTER — Encounter: Payer: Self-pay | Admitting: Family Medicine

## 2023-12-14 LAB — CALCIUM, IONIZED: Calcium, Ion: 5.2 mg/dL (ref 4.7–5.5)

## 2023-12-14 LAB — RHEUMATOID FACTOR: Rheumatoid fact SerPl-aCnc: <10 [IU]/mL (ref <14)

## 2023-12-14 LAB — CYCLIC CITRUL PEPTIDE ANTIBODY, IGG: Cyclic Citrullin Peptide Ab: <16 U

## 2023-12-15 LAB — PTH, INTACT AND CALCIUM
Calcium: 9.6 mg/dL (ref 8.6–10.4)
PTH: 46 pg/mL (ref 16–77)

## 2023-12-26 ENCOUNTER — Ambulatory Visit
Admission: RE | Admit: 2023-12-26 | Discharge: 2023-12-26 | Disposition: A | Discharge status: Home / Self Care | Source: Ambulatory Visit | Attending: Family Medicine | Admitting: Family Medicine

## 2023-12-26 DIAGNOSIS — Z981 Arthrodesis status: Secondary | ICD-10-CM | POA: Diagnosis not present

## 2023-12-26 DIAGNOSIS — M542 Cervicalgia: Secondary | ICD-10-CM

## 2023-12-26 MED ORDER — GADOPICLENOL 0.5 MMOL/ML IV SOLN
8.0000 mL | Freq: Once | INTRAVENOUS | Status: AC | PRN
Start: 2023-12-26 — End: 2023-12-26
  Administered 2023-12-26: 8 mL via INTRAVENOUS

## 2023-12-29 ENCOUNTER — Encounter: Payer: Self-pay | Admitting: Family Medicine

## 2024-01-01 ENCOUNTER — Other Ambulatory Visit: Payer: Self-pay

## 2024-01-01 ENCOUNTER — Encounter: Payer: Self-pay | Admitting: Family Medicine

## 2024-01-01 DIAGNOSIS — K660 Peritoneal adhesions (postprocedural) (postinfection): Secondary | ICD-10-CM

## 2024-01-08 ENCOUNTER — Ambulatory Visit
Admission: RE | Admit: 2024-01-08 | Discharge: 2024-01-08 | Disposition: A | Discharge status: Home / Self Care | Source: Ambulatory Visit | Attending: Family Medicine | Admitting: Family Medicine

## 2024-01-08 DIAGNOSIS — K660 Peritoneal adhesions (postprocedural) (postinfection): Secondary | ICD-10-CM

## 2024-01-08 MED ORDER — IOPAMIDOL (ISOVUE-300) INJECTION 61%
100.0000 mL | Freq: Once | INTRAVENOUS | Status: AC | PRN
Start: 2024-01-08 — End: 2024-01-08
  Administered 2024-01-08: 100 mL via INTRAVENOUS

## 2024-01-09 ENCOUNTER — Other Ambulatory Visit: Payer: Self-pay

## 2024-01-09 ENCOUNTER — Encounter: Payer: Self-pay | Admitting: Family Medicine

## 2024-01-09 DIAGNOSIS — K449 Diaphragmatic hernia without obstruction or gangrene: Secondary | ICD-10-CM

## 2024-01-23 ENCOUNTER — Encounter: Payer: Self-pay | Admitting: Family Medicine

## 2024-01-25 NOTE — Progress Notes (Signed)
 [facility] [address] [place] [address] Phone: [phone] Subjective:   Regina Marsh, am serving as a scribe for Dr. Ronnell Coins.  I'm seeing this patient by the request  of:  Ransom Byers, MD  CC:   [id number]  12/12/2023 Do believe that this could be potentially contributing to some of a problem as well.     Known degenerative disc disease.  Discussed icing regimen and home exercises.  We discussed which activities to do and which ones to avoid.  Increase activity slowly otherwise.  Would like to get laboratory workup to make sure nothing else is potentially contributing to her symptoms such as B12 levels and other laboratory workup.  Discussed icing regimen and home exercises, which activities to do and which room to avoid.  We did also laboratory workup of the cervical and iliac again to make sure there is no loosening of the previous surgery or any adjacent segment disease.  Would like to do it with contrast as well to check the vascularity with patient having significant labile hypertension.     Update 01/30/2024 Regina Marsh is a 78 y.o. female coming in with complaint of cervical spine pain. Patient states that she is doing a lot more and feels like she has more headaches and neck pain. Pain also in lower back and below both knees in each leg. Overall taking less meds and feels improvement over last visit. Notes muscle cramping throughout body.   Also notes tingling in her face with lumbar flexion, gait changes and legs feel like they are going to give out on her. Giving out has occurred every 4 months or so and would like to prevent this from occurring.       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   REVERSE SHOULDER ARTHROPLASTY Right 02/09/2022   Procedure: REVERSE SHOULDER ARTHROPLASTY;  Surgeon: Micheline Ahr, MD;  Location: [facility];  Service: Orthopedics;  Laterality: Right;   SALIVARY GLAND SURGERY Left 1968   TONSILLECTOMY AND ADENOIDECTOMY  1951   Social History   Socioeconomic History   Marital status: Married    Spouse name: Not on file   Number of children: 1   Years of education: Not on file   Highest education level: Not on file  Occupational History   Occupation: retired Runner, broadcasting/film/video  Tobacco Use   Smoking status: Never   Smokeless tobacco: Never  Vaping Use   Vaping status: Never Used  Substance and Sexual Activity   Alcohol use: Yes    Comment: rare   Drug use: Never   Sexual activity: Not Currently    Birth control/protection: Post-menopausal  Other Topics Concern   Not on file  Social History Narrative   Right handed    Lives in a two story home    Social Drivers of Health  Financial Resource Strain: Not on file  Food Insecurity: Not on file  Transportation Needs: Not on file  Physical Activity: Not on file  Stress: Not on file  Social Connections: Not on file   Allergies  Allergen Reactions   Aleve [Naproxen] Other (See Comments)    Causes severe bruising   Allegra [Fexofenadine] Other (See Comments)    Abdominal pain   Amlodipine  Other (See Comments)    lightheadedness   Cetirizine Hcl Other (See Comments)    Made her hyper - caused insomnia   Chlorthalidone  Other (See Comments)    lightheadedness   Hydralazine  Nausea And Vomiting    Leg pain, joint pain   Lactose Intolerance (Gi) Diarrhea   Lisinopril  Swelling    Caused eyes to swell   Loratadine Other (See Comments)    Made her  hyper - caused insomnia   Pravastatin Other (See Comments)    Leg pain, loss of memory   Simvastatin Other (See Comments)    Leg pain, loss of memory   Sulfa Antibiotics Nausea And Vomiting   Valsartan Swelling and Cough    Myalgias,  tongue swelling   African Mango [Irvingia Gabonensis] Swelling, Rash and Other (See Comments)    Blisters in mouth and tongue swelling   Penicillins Swelling and Rash    Tongue swelling   Spironolactone  Rash   Family History  Problem Relation Age of Onset   Hypertension Mother    Heart attack Mother    Stroke Father    Leukemia Father    Allergic rhinitis Neg Hx    Angioedema Neg Hx    Asthma Neg Hx    Atopy Neg Hx    Eczema Neg Hx    Immunodeficiency Neg Hx    Urticaria Neg Hx     Current Outpatient Medications (Endocrine & Metabolic):    SYNTHROID 50 MCG tablet, Take 50 mcg by mouth daily.  Current Outpatient Medications (Cardiovascular):    doxazosin  (CARDURA ) 1 MG tablet, Take 3 tablets (3 mg total) by mouth at bedtime.  Current Outpatient Medications (Respiratory):    diphenhydrAMINE (BENADRYL) 25 MG tablet, Take 0.5 tablets by mouth daily as needed (allergies).  Current Outpatient Medications (Analgesics):    acetaminophen  (TYLENOL ) 325 MG tablet, Take 650 mg by mouth as needed for moderate pain, mild pain or headache.   aspirin  EC 81 MG tablet, Take 81 mg by mouth daily. Swallow whole.   Current Outpatient Medications (Other):    Bismuth Subsalicylate (PEPTO BISMOL PO), Take by mouth.   Cholecalciferol (VITAMIN D3) 50 MCG (2000 UT) capsule, Take 4,000 Units by mouth daily. Take 2 tablets daily   D-MANNOSE PO, Take 50 mg by mouth daily.   DULoxetine  (CYMBALTA ) 20 MG capsule, Take 1 capsule (20 mg total) by mouth daily.   famotidine  (PEPCID ) 20 MG tablet, Take 1 tablet (20 mg total) by mouth 2 (two) times daily.   L-THEANINE PO, Take 1 tablet by mouth daily as needed (pain).   Lactase (LACTAID PO), Take 1-3 tablets by mouth 3  (three) times daily as needed (when consuming milk products).   MAGNESIUM GLYCINATE PO, Take 400 mg by mouth daily.   Menatetrenone (VITAMIN K2) 100 MCG TABS, Take 200 mcg by mouth daily.   Polyethyl Glycol-Propyl Glycol (SYSTANE) 0.4-0.3 % GEL ophthalmic gel, Place 1 Application into both eyes. Once daily at bedtime.   Probiotic Product (PROBIOTIC BLEND PO), Take 1 tablet by mouth daily.   Propylene Glycol (SYSTANE BALANCE OP), Place 1 drop  into both eyes 3 (three) times daily as needed (dry eyes).   PSYLLIUM PO, Take 1 capsule by mouth daily at 12 noon.   Simethicone (GAS-X PO), Take 1 tablet by mouth daily as needed (flatulance).   TART CHERRY PO, Take 1,200 mg by mouth at bedtime. Take two tablets at bedtime.   Turmeric Curcumin 500 MG CAPS, Take 500 mg by mouth daily.   Ubiquinol 100 MG CAPS, Take 1 capsule by mouth in the morning and at bedtime.   VITAMIN C, CALCIUM ASCORBATE, PO, Take 350 mg by mouth daily.   Reviewed prior external information including notes and imaging from  primary care provider As well as notes that were available from care everywhere and other healthcare systems.  Past medical history, social, surgical and family history all reviewed in electronic medical record.  No pertanent information unless stated regarding to the chief complaint.   Review of Systems:  No headache, visual changes, nausea, vomiting, diarrhea, constipation, dizziness, abdominal pain, skin rash, fevers, chills, night sweats, weight loss, swollen lymph nodes, body aches, joint swelling, chest pain, shortness of breath, mood changes. POSITIVE muscle aches  Objective  Blood pressure (!) 158/104, pulse 78, height 5\' 2"  (1.575 m), weight 171 lb (77.6 kg), SpO2 97%.   General: No apparent distress alert and oriented x3 mood and affect normal, dressed appropriately.  HEENT: Pupils equal, extraocular movements intact  Respiratory: Patient's speak in full sentences and does not appear short of  breath  Cardiovascular: No lower extremity edema, non tender, no erythema  Patient's neck exam does have some loss lordosis noted.  Tightness noted with Faber's severely on the left compared to the right.  Tender to palpation over the greater trochanteric area on the left greater than right.    Impression and Recommendations:    The above documentation has been reviewed and is accurate and complete Kaybree Williams M Arturo Freundlich, DO

## 2024-01-29 ENCOUNTER — Ambulatory Visit (HOSPITAL_BASED_OUTPATIENT_CLINIC_OR_DEPARTMENT_OTHER): Payer: Medicare Other | Admitting: Family

## 2024-01-29 ENCOUNTER — Encounter (HOSPITAL_BASED_OUTPATIENT_CLINIC_OR_DEPARTMENT_OTHER): Payer: Self-pay | Admitting: Family

## 2024-01-29 VITALS — BP 164/77 | HR 68 | Ht 62.0 in | Wt 172.0 lb

## 2024-01-29 DIAGNOSIS — I10 Essential (primary) hypertension: Secondary | ICD-10-CM

## 2024-01-29 DIAGNOSIS — E785 Hyperlipidemia, unspecified: Secondary | ICD-10-CM | POA: Diagnosis not present

## 2024-01-29 DIAGNOSIS — I25118 Atherosclerotic heart disease of native coronary artery with other forms of angina pectoris: Secondary | ICD-10-CM | POA: Diagnosis not present

## 2024-01-29 MED ORDER — DOXAZOSIN MESYLATE 1 MG PO TABS: 3.0000 mg | ORAL_TABLET | Freq: Every day | ORAL | 3 refills | Status: AC

## 2024-01-29 NOTE — Patient Instructions (Signed)
 Medication Instructions:  CHANGE Doxazosin  to 3mg  every evening Take 3 of your 1mg  tablet together  *If you need a refill on your cardiac medications before your next appointment, please call your pharmacy*  Follow-Up: At [facility], you and your health needs are our priority.  As part of our continuing mission to provide you with exceptional heart care, our providers are all part of one team.  This team includes your primary Cardiologist (physician) and Advanced Practice Providers or APPs (Physician Assistants and Nurse Practitioners) who all work together to provide you with the care you need, when you need it.  Your next appointment:   In August as scheduled with Dr. Veryl Gottron  We recommend signing up for the patient portal called "MyChart".  Sign up information is provided on this After Visit Summary.  MyChart is used to connect with patients for Virtual Visits (Telemedicine).  Patients are able to view lab/test results, encounter notes, upcoming appointments, etc.  Non-urgent messages can be sent to your provider as well.   To learn more about what you can do with MyChart, go to [email or web address].

## 2024-01-29 NOTE — Progress Notes (Signed)
 Cardiology Office Note:  .   Date:  01/29/2024  ID:  Regina Marsh, DOB [birth year], MRN [id number] PCP: Regina Byers, MD  [facility] Providers Cardiologist:  Regina Donning, MD    History of Present Illness: Regina Marsh   Regina Marsh is a 78 y.o. female with history of Sjogren's, hypertension, hyperlipidemia, coronary artery disease, GERD.    BP has been difficult to control given multiple medication intolerances.  Previously metanephrines, renin-aldosterone unremarkable.  Renal duplex 06/17/2022 with no stenosis.  Prior coronary calcium score 04/29/2021 of 27.7 placing her in the 42nd percentile for age, race, sex matched control.  Seen 03/15/23 at which time HCTZ 25 mg daily was continued and doxazosin  1 mg every evening resumed. Seen 05/02/23,  Doxazosin  increased to 2mg  daily. At visit 06/02/23 Doxazosin  increased to 3mg  every evening. Hydrochlorothiazide  reduced from 25mg  to 12.5mg  due to more bothersome dryness with Sjogren's. Via subsequent MyChart message with home BP 140-160s Doxazosin  increased to 4mg . She took doxazosin  4 mg for 10 days but did not tolerate with worsening BP, severe lightheadedness, eye dryness.  Decrease to 2 mg dose.  Seen by Dr. Lydia Marsh of this 5/24 for leg weakness with gait instability and hyperreflexia with MRI spine and brain with stable with no new findings to explain her symptoms.  Last seen 11/06/2023 by Dr. Veryl Marsh. Propranolol  10mg  PRN initiated for BP spikes.   Presents today for follow up. Very excited about her new grandson's upcoming birth in May. Reports episode 11/24/23 walking forward but felt she was walking off crooked and it lasted 1 week. She was working with PT and established with Dr. Ronnell Coins. Has had multiple tests, made medication changes including discontinuing hydrochlorothiazide  due to elevated uric acid, adding tart cherry and ubiquinol.  She feels her swelling has actually decreased since that time. Feels her energy  level is improved but pain increased since medication adjustments with Dr. Shirlie Dove office which escalates her blood pressure. BP at home 150-170s/70-80s with most recent home reading 164/77. Reports her PRN PRopranolol  was "a disaster". She felt it worsened crash of her blood pressure. Tried it twice with lightheaded, BP drop. No longer taking her thyroid  medication - seeing Dr. Donia Marsh Thursday and plans to discuss at that time as she has not taken in several months. She is interested in trialing Doxazosin  3mg  at bedtime as anticipates her prior intolerance was more related to hydrochlorothiazide .    Previous hypertensives Spironolactone : rash Beta blockers: felt terrible (remotely) Amlodipine : lightheadedness Minoxidil : face/tongue swelling Lisinopril : lightheadedness, stomach pain Valsartan: lightheadedness, nausea, stomach pain Chlorthalidone : lightheadedness, nausea Hydralazine : joint pain and fatigue Doxazosin : 4mg  dose (tolerating 2mg ) Hydrochlorothiazide  -exacerbated Sjogren's, increased uric acid  ROS: Please see the history of present illness.    All other systems reviewed and are negative.   Studies Reviewed: Regina Marsh   EKG Interpretation Date/Time:  Monday January 29 2024 09:19:44 EDT Ventricular Rate:  68 PR Interval:  198 QRS Duration:  104 QT Interval:  456 QTC Calculation: 484 R Axis:   15  Text Interpretation: Normal sinus rhythm Left ventricular hypertrophy with repolarization abnormality ( R in aVL , Sokolow-Lyon , Cornell product ) When compared with ECG of 04-Feb-2022 12:05, No significant change was found Confirmed by Regina Marsh ([id number]) on 01/29/2024 9:23:52 AM       Risk Assessment/Calculations:     HYPERTENSION CONTROL Vitals:   01/29/24 0904 01/29/24 1003  BP: (!) 200/100 (!) 164/77    The patient's blood pressure is elevated above  target today.  In order to address the patient's elevated BP: A current anti-hypertensive medication was adjusted today.           Physical Exam:   VS:  BP (!) 164/77 Comment: home BP  Pulse 68   Ht 5\' 2"  (1.575 m)   Wt 172 lb (78 kg)   BMI 31.46 kg/m    Wt Readings from Last 3 Encounters:  01/29/24 172 lb (78 kg)  12/12/23 173 lb (78.5 kg)  11/06/23 171 lb 1.6 oz (77.6 kg)    Vitals:   01/29/24 0904 01/29/24 1003  BP: (!) 200/100 (!) 164/77 Comment: home BP  Pulse: 68   Height: 5\' 2"  (1.575 m)   Weight: 172 lb (78 kg)   BMI (Calculated): 31.45     GEN: Well nourished, well developed in no acute distress NECK: No JVD; No carotid bruits CARDIAC: RRR, no murmurs, rubs, gallops RESPIRATORY:  Clear to auscultation without rales, wheezing or rhonchi  ABDOMEN: Soft, non-tender, non-distended EXTREMITIES:  No edema; No deformity   ASSESSMENT AND PLAN: .    HTN - [place] hypertension superimposed on hypertension.  Multiple prior intolerances detailed above. Most recently hydrochlorothiazide  discontinued by sports medicine provider due to elevated uric acid. Home BP cuff previously found to be accurate. Reports BP higher due to recent orthopedic pain.  Plan to increase Doxazosin  3mg  every evening Future considerations include: addition of Imdur, or addition of Clonidine patch at follow up.  Previous secondary workup including renal aldosterone, metanephrines, renal duplex unremarkable.   Has previously politely declined renal denervation as she is not interested in procedures at this time.   Hypothyroidism - has not taken levothyroxine in several months due to reported adverse effects. Plans to discuss with PCP at upcoming follow up.   CAD / HLD - Stable with no anginal symptoms. No indication for ischemic evaluation.  GDMT Aspirin . Previously did not tolerate statin, beta blocker. Has politely declined further lipid lowering agents. Recommend aiming for 150 minutes of moderate intensity activity per week and following a heart healthy diet.         Dispo: follow up 05/2024 as scheduled with Dr.  Veryl Marsh  Signed, Clearnce Curia, NP

## 2024-01-30 ENCOUNTER — Encounter: Payer: Self-pay | Admitting: Family Medicine

## 2024-01-30 ENCOUNTER — Ambulatory Visit: Admitting: Family Medicine

## 2024-01-30 VITALS — BP 158/104 | HR 78 | Ht 62.0 in | Wt 171.0 lb

## 2024-01-30 DIAGNOSIS — M5010 Cervical disc disorder with radiculopathy, unspecified cervical region: Secondary | ICD-10-CM

## 2024-01-30 DIAGNOSIS — K449 Diaphragmatic hernia without obstruction or gangrene: Secondary | ICD-10-CM | POA: Insufficient documentation

## 2024-01-30 MED ORDER — DULOXETINE HCL 20 MG PO CPEP: 20.0000 mg | ORAL_CAPSULE | Freq: Every day | ORAL | 3 refills | Status: DC

## 2024-01-30 NOTE — Assessment & Plan Note (Signed)
 Known arthritic changes with postsurgical changes.  Continuing to have some cramping of not only the neck but also of the thoracic spine, lower extremities, and even hands intermittently.  Patient has had significant difficulty with different medications but would like to consider the possibility of Cymbalta .  We discussed with patient about icing regimen and home exercises.  Discussed with patient about which activities to do and which ones to avoid.  Patient has done formal physical therapy multiple times.  Will continue to monitor.  Follow-up with me again in 2 months.  Total time with patient today 32 minutes

## 2024-01-30 NOTE — Patient Instructions (Signed)
 Good to see you  Cymbalta  20 mg daily  Keep doing the balancing and stay active  send me a message in 2 weeks  See me again in 6-8 weeks

## 2024-01-30 NOTE — Assessment & Plan Note (Signed)
 Noted on imaging.  Patient could be having some mild symptoms but nothing severe.  Patient declined referral to gastroenterology at this moment.

## 2024-02-01 DIAGNOSIS — R5383 Other fatigue: Secondary | ICD-10-CM | POA: Diagnosis not present

## 2024-02-01 DIAGNOSIS — M3500 Sicca syndrome, unspecified: Secondary | ICD-10-CM | POA: Diagnosis not present

## 2024-02-01 DIAGNOSIS — Z0000 Encounter for general adult medical examination without abnormal findings: Secondary | ICD-10-CM | POA: Diagnosis not present

## 2024-02-01 DIAGNOSIS — Z23 Encounter for immunization: Secondary | ICD-10-CM | POA: Diagnosis not present

## 2024-02-01 DIAGNOSIS — E039 Hypothyroidism, unspecified: Secondary | ICD-10-CM | POA: Diagnosis not present

## 2024-02-01 DIAGNOSIS — I10 Essential (primary) hypertension: Secondary | ICD-10-CM | POA: Diagnosis not present

## 2024-02-01 DIAGNOSIS — R946 Abnormal results of thyroid function studies: Secondary | ICD-10-CM | POA: Diagnosis not present

## 2024-02-01 DIAGNOSIS — E7800 Pure hypercholesterolemia, unspecified: Secondary | ICD-10-CM | POA: Diagnosis not present

## 2024-02-01 DIAGNOSIS — R7301 Impaired fasting glucose: Secondary | ICD-10-CM | POA: Diagnosis not present

## 2024-02-14 ENCOUNTER — Encounter: Payer: Self-pay | Admitting: Family Medicine

## 2024-02-14 DIAGNOSIS — R7989 Other specified abnormal findings of blood chemistry: Secondary | ICD-10-CM

## 2024-02-21 ENCOUNTER — Ambulatory Visit
Admission: RE | Admit: 2024-02-21 | Discharge: 2024-02-21 | Disposition: A | Discharge status: Home / Self Care | Source: Ambulatory Visit | Attending: Family Medicine | Admitting: Family Medicine

## 2024-02-21 DIAGNOSIS — E042 Nontoxic multinodular goiter: Secondary | ICD-10-CM | POA: Diagnosis not present

## 2024-02-21 DIAGNOSIS — R7989 Other specified abnormal findings of blood chemistry: Secondary | ICD-10-CM

## 2024-02-22 DIAGNOSIS — I160 Hypertensive urgency: Secondary | ICD-10-CM | POA: Diagnosis not present

## 2024-02-26 DIAGNOSIS — I160 Hypertensive urgency: Secondary | ICD-10-CM | POA: Diagnosis not present

## 2024-03-07 ENCOUNTER — Ambulatory Visit: Admitting: Gastroenterology

## 2024-03-07 ENCOUNTER — Encounter: Payer: Self-pay | Admitting: Gastroenterology

## 2024-03-07 VITALS — BP 130/80 | HR 70 | Ht 62.0 in | Wt 166.0 lb

## 2024-03-07 DIAGNOSIS — K449 Diaphragmatic hernia without obstruction or gangrene: Secondary | ICD-10-CM

## 2024-03-07 DIAGNOSIS — M350C Sjogren syndrome with dental involvement: Secondary | ICD-10-CM | POA: Diagnosis not present

## 2024-03-07 DIAGNOSIS — R143 Flatulence: Secondary | ICD-10-CM | POA: Insufficient documentation

## 2024-03-07 NOTE — Progress Notes (Signed)
 03/07/2024 Regina Marsh [id number] 04-29-1946   HISTORY OF PRESENT ILLNESS: This is a 78 year old female who is a patient of Dr. Allean Aran.  She was last seen here in January 2023 with complaints of GERD and epigastric abdominal pain as well as intermittent episodes of chest discomfort with negative cardiac workup.  She esd about to undergo C-spine surgery with an anterior approach so she was treated with Prevacid  SoluTab and was given Levsin sublingual for possible esophageal spasms and discomfort.  She says she does not recall ever taking those and she did not have any issues with swallowing after her surgery.  She is here today for evaluation of her hiatal hernia.  She says that Dr. Fawn Hooks sent her here because of findings on recent CT scan as below.  She says that she does have a lot of gas and sometimes general unwell feeling after eating.  Mild nausea, but not significant.  No abdominal pain or epigastric abdominal pain or atypical chest pain at this point.  No significant reflux symptoms.  She does take famotidine  20 mg twice daily and Gas-X as needed.  CT scan of the abdomen and pelvis with contrast on 01/08/2024:  IMPRESSION: 1.  No acute intra-abdominal or pelvic abnormality. 2. Small to moderate sized sliding-type hiatal hernia. In the lower mediastinum, along the right lateral aspect of the esophagus, there is a low-density structure, unchanged, measuring 2 cm. This could represent a small GI duplication cyst or a small amount of trapped free fluid in the hiatal hernia sac. 3. Scattered colonic diverticulosis. No changes of acute diverticulitis. 4. Mild hepatic steatosis. 5. In the right lower lobe, there are a couple of small lung nodules measuring up to 5 mm. Comparison with outside imaging is recommended to document stability. If none is available, follow-up is recommended, as documented below.     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 She has never used smokeless tobacco. She reports current alcohol use. She reports that she does not use drugs. family history includes Heart attack in her mother; Hypertension in her mother; Leukemia in her father; Stroke in her father. Allergies  Allergen Reactions   Aleve [Naproxen] Other (See Comments)    Causes severe bruising   Allegra [Fexofenadine] Other (See Comments)    Abdominal pain   Amlodipine  Other (See Comments)    lightheadedness   Cetirizine Hcl Other (See Comments)    Made her hyper - caused insomnia   Chlorthalidone  Other (See Comments)    lightheadedness   Hydralazine  Nausea And  Vomiting    Leg pain, joint pain   Lactose Intolerance (Gi) Diarrhea   Lisinopril  Swelling    Caused eyes to swell   Loratadine Other (See Comments)    Made her hyper - caused insomnia   Pravastatin Other (See Comments)    Leg pain, loss of memory   Simvastatin Other (See Comments)    Leg pain, loss of memory   Sulfa Antibiotics Nausea And Vomiting   Valsartan Swelling and Cough    Myalgias,  tongue swelling   African Mango [Irvingia Gabonensis] Swelling, Rash and Other (See Comments)    Blisters in mouth and tongue swelling    Penicillins Swelling and Rash    Tongue swelling   Spironolactone  Rash      Outpatient Encounter Medications as of 03/07/2024  Medication Sig   acetaminophen  (TYLENOL ) 325 MG tablet Take 650 mg by mouth as needed for moderate pain, mild pain or headache.   aspirin  EC 81 MG tablet Take 81 mg by mouth daily. Swallow whole.   Bismuth Subsalicylate (PEPTO BISMOL PO) Take by mouth.   Cholecalciferol (VITAMIN D3) 50 MCG (2000 UT) capsule Take 4,000 Units by mouth daily. Take 2 tablets daily   D-MANNOSE PO Take 50 mg by mouth daily.   diphenhydrAMINE (BENADRYL) 25 MG tablet Take 0.5 tablets by mouth daily as needed (allergies).   doxazosin  (CARDURA ) 1 MG tablet Take 3 tablets (3 mg total) by mouth at bedtime.   DULoxetine  (CYMBALTA ) 20 MG capsule Take 1 capsule (20 mg total) by mouth daily.   famotidine  (PEPCID ) 20 MG tablet Take 1 tablet (20 mg total) by mouth 2 (two) times daily.   L-THEANINE PO Take 1 tablet by mouth daily as needed (pain).   Lactase (LACTAID PO) Take 1-3 tablets by mouth 3 (three) times daily as needed (when consuming milk products).   MAGNESIUM GLYCINATE PO Take 400 mg by mouth daily.   Menatetrenone (VITAMIN K2) 100 MCG TABS Take 200 mcg by mouth daily.   Polyethyl Glycol-Propyl Glycol (SYSTANE) 0.4-0.3 % GEL ophthalmic gel Place 1 Application into both eyes. Once daily at bedtime.   Probiotic Product (PROBIOTIC BLEND PO) Take 1 tablet by mouth daily.   Propylene Glycol (SYSTANE BALANCE OP) Place 1 drop into both eyes 3 (three) times daily as needed (dry eyes).   PSYLLIUM PO Take 1 capsule by mouth daily at 12 noon.   Simethicone (GAS-X PO) Take 1 tablet by mouth daily as needed (flatulance).   SYNTHROID 50 MCG tablet Take 50 mcg by mouth daily.   TART CHERRY PO Take 1,200 mg by mouth at bedtime. Take two tablets at bedtime.   Turmeric Curcumin 500 MG CAPS Take 500 mg by mouth daily.   Ubiquinol 100 MG CAPS Take 1 capsule by mouth in the morning and at bedtime.    VITAMIN C, CALCIUM ASCORBATE, PO Take 350 mg by mouth daily.   No facility-administered encounter medications on file as of 03/07/2024.    REVIEW OF SYSTEMS  : All other systems reviewed and negative except where noted in the History of Present Illness.   PHYSICAL EXAM: BP 130/80   Pulse 70   Ht 5\' 2"  (1.575 m)   Wt 166 lb (75.3 kg)   BMI 30.36 kg/m  General: Well developed white female in no acute distress Head: Normocephalic and atraumatic Eyes:  Sclerae anicteric, conjunctiva pink. Ears: Normal auditory acuity Lungs: Clear throughout to auscultation; no W/R/R. Heart: Regular rate and rhythm; no M/R/G. Musculoskeletal: Symmetrical with  no gross deformities  Skin: No lesions on visible extremities Neurological: Alert oriented x 4, grossly non-focal Psychological:  Alert and cooperative. Normal mood and affect  ASSESSMENT AND PLAN: *Small to moderate sized sliding-type hiatal hernia. In the lower mediastinum, along the right lateral aspect of the esophagus, there is a low-density structure, unchanged, measuring 2 cm. This could represent a small GI duplication cyst or a small amount of trapped free fluid in the hiatal hernia sac.  Will plan for EGD for evaluation.  Scheduled with Dr. Nandigam.  The risks, benefits, and alternatives to EGD were discussed with the patient and she consents to proceed.  *Gas: Reports a lot of gas and just general on well feeling after eating often times.  Maybe mild nausea, but not even necessarily that per se.  She feels that she may be lactose intolerant.  We discussed that gas is usually something dietary that you are ingesting that your gut is having a hard time breaking down.  We discussed FODMAP diet and she was given literature on this.  Could try Fodzyme enzymes as well. *Sjogren's with very dry mouth *History of C-spine surgery and neck arthritis, should not have any issues with neck mobility for EGD as discussed with patient.  CC:   Ransom Byers, *

## 2024-03-07 NOTE — Patient Instructions (Signed)
 You have been scheduled for an endoscopy. Please follow written instructions given to you at your visit today.  If you use inhalers (even only as needed), please bring them with you on the day of your procedure.  If you take any of the following medications, they will need to be adjusted prior to your procedure:   DO NOT TAKE 7 DAYS PRIOR TO TEST- Trulicity (dulaglutide) Ozempic, Wegovy (semaglutide) Mounjaro (tirzepatide) Bydureon Bcise (exanatide extended release)  DO NOT TAKE 1 DAY PRIOR TO YOUR TEST Rybelsus (semaglutide) Adlyxin (lixisenatide) Victoza (liraglutide) Byetta (exanatide) ____________________________________________________________________  _______________________________________________________  If your blood pressure at your visit was 140/90 or greater, please contact your primary care physician to follow up on this.  _______________________________________________________  If you are age 72 or older, your body mass index should be between 23-30. Your Body mass index is 30.36 kg/m. If this is out of the aforementioned range listed, please consider follow up with your Primary Care Provider.  If you are age 39 or younger, your body mass index should be between 19-25. Your Body mass index is 30.36 kg/m. If this is out of the aformentioned range listed, please consider follow up with your Primary Care Provider.   ________________________________________________________  The [facility] providers would like to encourage you to use MYCHART to communicate with providers for non-urgent requests or questions.  Due to long hold times on the telephone, sending your provider a message by [facility] may be a faster and more efficient way to get a response.  Please allow 48 business hours for a response.  Please remember that this is for non-urgent requests.  _______________________________________________________

## 2024-03-11 NOTE — Progress Notes (Signed)
 [facility] [address] [place] [address] Phone: [phone] Subjective:   Regina Marsh, am serving as a scribe for Dr. Ronnell Coins.  I'm seeing this patient by the request  of:  Ransom Byers, MD  CC:   [id number]  01/30/2024 Known arthritic changes with postsurgical changes.  Continuing to have some cramping of not only the neck but also of the thoracic spine, lower extremities, and even hands intermittently.  Patient has had significant difficulty with different medications but would like to consider the possibility of Cymbalta .  We discussed with patient about icing regimen and home exercises.  Discussed with patient about which activities to do and which ones to avoid.  Patient has done formal physical therapy multiple times.  Will continue to monitor.  Follow-up with me again in 2 months.  Total time with patient today 32 minutes      Update 03/12/2024 Regina Marsh is a 78 y.o. female coming in with complaint of cervical spine pain. Patient states results from GI. Had another episode of difficulty walking. That happened on May 19 and ended on the 21st.       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   REVERSE SHOULDER ARTHROPLASTY Right 02/09/2022   Procedure: REVERSE SHOULDER ARTHROPLASTY;  Surgeon: Micheline Ahr, MD;  Location: [facility];  Service: Orthopedics;  Laterality: Right;   SALIVARY GLAND SURGERY Left 1968   TONSILLECTOMY AND  ADENOIDECTOMY  1951   Social History   Socioeconomic History   Marital status: Married    Spouse name: Radio producer   Number of children: 1   Years of education: Not on file   Highest education level: Not on file  Occupational History   Occupation: retired Runner, broadcasting/film/video  Tobacco Use   Smoking status: Never   Smokeless tobacco: Never  Vaping Use   Vaping status: Never Used  Substance and Sexual Activity   Alcohol use: Yes    Comment: rare   Drug use: Never   Sexual activity: Not Currently    Birth control/protection: Post-menopausal  Other Topics Concern   Not on file  Social History Narrative   Right handed    Lives in a two story home    Social Drivers of Health   Financial Resource Strain: Not on file  Food Insecurity: Not on file  Transportation Needs: Not on file  Physical Activity: Not on file  Stress: Not on file  Social Connections: Not on file   Allergies  Allergen Reactions   Aleve [Naproxen] Other (See Comments)    Causes severe bruising   Allegra [Fexofenadine] Other (See Comments)    Abdominal pain   Amlodipine  Other (See Comments)    lightheadedness   Cetirizine Hcl Other (See Comments)    Made her hyper - caused insomnia   Chlorthalidone  Other (See Comments)  lightheadedness   Hydralazine  Nausea And Vomiting    Leg pain, joint pain   Lactose Intolerance (Gi) Diarrhea   Lisinopril  Swelling    Caused eyes to swell   Loratadine Other (See Comments)    Made her hyper - caused insomnia   Pravastatin Other (See Comments)    Leg pain, loss of memory   Simvastatin Other (See Comments)    Leg pain, loss of memory   Sulfa Antibiotics Nausea And Vomiting   Valsartan Swelling and Cough    Myalgias,  tongue swelling   African Mango [Irvingia Gabonensis] Swelling, Rash and Other (See Comments)    Blisters in mouth and tongue swelling   Penicillins Swelling and Rash    Tongue swelling   Spironolactone  Rash   Family History  Problem Relation Age of Onset    Hypertension Mother    Heart attack Mother    Stroke Father    Leukemia Father    Allergic rhinitis Neg Hx    Angioedema Neg Hx    Asthma Neg Hx    Atopy Neg Hx    Eczema Neg Hx    Immunodeficiency Neg Hx    Urticaria Neg Hx    Colon cancer Neg Hx    Esophageal cancer Neg Hx      Current Outpatient Medications (Cardiovascular):    doxazosin  (CARDURA ) 1 MG tablet, Take 3 tablets (3 mg total) by mouth at bedtime.  Current Outpatient Medications (Respiratory):    diphenhydrAMINE (BENADRYL) 25 MG tablet, Take 0.5 tablets by mouth daily as needed (allergies).  Current Outpatient Medications (Analgesics):    acetaminophen  (TYLENOL ) 325 MG tablet, Take 650 mg by mouth as needed for moderate pain, mild pain or headache.   aspirin  EC 81 MG tablet, Take 81 mg by mouth daily. Swallow whole.   Current Outpatient Medications (Other):    Bismuth Subsalicylate (PEPTO BISMOL PO), Take by mouth.   Cholecalciferol (VITAMIN D3) 50 MCG (2000 UT) capsule, Take 4,000 Units by mouth daily. Take 2 tablets daily   D-MANNOSE PO, Take 50 mg by mouth daily.   DULoxetine  (CYMBALTA ) 20 MG capsule, Take 1 capsule (20 mg total) by mouth daily.   famotidine  (PEPCID ) 20 MG tablet, Take 1 tablet (20 mg total) by mouth 2 (two) times daily.   L-THEANINE PO, Take 1 tablet by mouth daily as needed (pain).   Lactase (LACTAID PO), Take 1-3 tablets by mouth 3 (three) times daily as needed (when consuming milk products).   MAGNESIUM GLYCINATE PO, Take 400 mg by mouth daily.   Menatetrenone (VITAMIN K2) 100 MCG TABS, Take 200 mcg by mouth daily.   Polyethyl Glycol-Propyl Glycol (SYSTANE) 0.4-0.3 % GEL ophthalmic gel, Place 1 Application into both eyes. Once daily at bedtime.   Probiotic Product (PROBIOTIC BLEND PO), Take 1 tablet by mouth daily.   Propylene Glycol (SYSTANE BALANCE OP), Place 1 drop into both eyes 3 (three) times daily as needed (dry eyes).   PSYLLIUM PO, Take 1 capsule by mouth daily at 12 noon.    Simethicone (GAS-X PO), Take 1 tablet by mouth daily as needed (flatulance).   TART CHERRY PO, Take 1,200 mg by mouth at bedtime. Take two tablets at bedtime.   Turmeric Curcumin 500 MG CAPS, Take 500 mg by mouth daily.   Ubiquinol 100 MG CAPS, Take 1 capsule by mouth in the morning and at bedtime.   VITAMIN C, CALCIUM ASCORBATE, PO, Take 350 mg by mouth daily.   Reviewed prior external information including notes  and imaging from  primary care provider As well as notes that were available from care everywhere and other healthcare systems.  Past medical history, social, surgical and family history all reviewed in electronic medical record.  No pertanent information unless stated regarding to the chief complaint.   Review of Systems:  No  visual changes, nausea, vomiting, diarrhea, constipation, abdominal pain, skin rash, fevers, chills, night sweats, weight loss, swollen lymph nodes, body aches, joint swelling, chest pain, shortness of breath, mood changes. POSITIVE muscle aches, headache, dizziness, intermittent dizziness  Objective  Blood pressure (!) 142/96, pulse 83, height 5\' 2"  (1.575 m), weight 168 lb (76.2 kg), SpO2 98%.   General: No apparent distress alert and oriented x3 mood and affect normal, dressed appropriately.  HEENT: Pupils equal, extraocular movements intact  Respiratory: Patient's speak in full sentences and does not appear short of breath  Cardiovascular: No lower extremity edema, non tender, no erythema  Neck exam is significant stiffness noted.  Sitting relatively comfortably but does seem to be uncomfortable and has to change positions every 5 to 6 minutes. Does get minorly dizzy with extension of the neck.   Impression and Recommendations:     The above documentation has been reviewed and is accurate and complete Ashe Graybeal M Darene Nappi, DO

## 2024-03-12 ENCOUNTER — Ambulatory Visit: Admitting: Family Medicine

## 2024-03-12 VITALS — BP 142/96 | HR 83 | Ht 62.0 in | Wt 168.0 lb

## 2024-03-12 DIAGNOSIS — M2550 Pain in unspecified joint: Secondary | ICD-10-CM | POA: Diagnosis not present

## 2024-03-12 DIAGNOSIS — R42 Dizziness and giddiness: Secondary | ICD-10-CM | POA: Diagnosis not present

## 2024-03-12 DIAGNOSIS — M542 Cervicalgia: Secondary | ICD-10-CM

## 2024-03-12 DIAGNOSIS — M5010 Cervical disc disorder with radiculopathy, unspecified cervical region: Secondary | ICD-10-CM | POA: Diagnosis not present

## 2024-03-12 NOTE — Patient Instructions (Addendum)
 Labs today [facility] [phone] Call Today  When we receive your results we will contact you.  See you again in after EGD in July

## 2024-03-12 NOTE — Assessment & Plan Note (Signed)
 Continues to have normal findings on exam.  Concerned that it is still potentially her neck.  Discussed the potential MR angiogram of the neck to further evaluate for any type of vascular compromise that could be potential.  We discussed an MR angiogram of the head as well that I think will be beneficial.  Discussed with findings of anything we will refer accordingly.  Continues to have the episodic lightheadedness as well which is difficult.

## 2024-03-12 NOTE — Assessment & Plan Note (Signed)
 Once again we will get imaging.  Mostly of the neck with patient having this previously as numbing symptoms.  Was unfortunately had the loss of the urine catecholamines.  Will get another 24-hour collection when patient has symptoms

## 2024-03-13 NOTE — Addendum Note (Signed)
 Addended by: Bryan Caprio R on: 03/13/2024 10:31 AM   Modules accepted: Orders

## 2024-03-13 NOTE — Addendum Note (Signed)
 Addended by: Bryan Caprio R on: 03/13/2024 10:00 AM   Modules accepted: Orders

## 2024-03-25 ENCOUNTER — Ambulatory Visit
Admission: RE | Admit: 2024-03-25 | Discharge: 2024-03-25 | Disposition: A | Discharge status: Home / Self Care | Source: Ambulatory Visit | Attending: Family Medicine | Admitting: Family Medicine

## 2024-03-25 DIAGNOSIS — M542 Cervicalgia: Secondary | ICD-10-CM

## 2024-03-25 MED ORDER — GADOPICLENOL 0.5 MMOL/ML IV SOLN
8.0000 mL | Freq: Once | INTRAVENOUS | Status: AC | PRN
  Administered 2024-03-25: 8 mL via INTRAVENOUS

## 2024-04-02 ENCOUNTER — Encounter (HOSPITAL_BASED_OUTPATIENT_CLINIC_OR_DEPARTMENT_OTHER): Payer: Self-pay

## 2024-04-03 ENCOUNTER — Ambulatory Visit: Payer: Self-pay | Admitting: Family Medicine

## 2024-04-08 NOTE — Telephone Encounter (Signed)
BP log as requested 

## 2024-04-10 NOTE — Telephone Encounter (Signed)
 Thoughts? Multiple intolerances, reports her BP is better with Doxazosin  2mg  instead of 3mg . She has upcoming endoscopy which she wants us  to write a letter that it is okay to do if her BP is high. I just am not comfortable with that.   Marcena Dias S Nathanyal Ashmead, NP

## 2024-04-15 ENCOUNTER — Encounter: Payer: Self-pay | Admitting: Gastroenterology

## 2024-04-17 ENCOUNTER — Other Ambulatory Visit

## 2024-04-17 DIAGNOSIS — M2550 Pain in unspecified joint: Secondary | ICD-10-CM

## 2024-04-19 NOTE — Telephone Encounter (Signed)
 Staff message sent to GI team.   Reche GORMAN Finder, NP

## 2024-04-20 LAB — CORTISOL, URINE, FREE
Cortisol (Ur), Free: 13 ug/(24.h) (ref 6–42)
Cortisol,F,ug/L,U: 11 ug/L

## 2024-04-20 LAB — CATECHOLAMINES, FRACTIONATED, URINE, 24 HOUR
Calc Total (E+NE): 37 ug/(24.h) (ref 26–121)
Creatinine, Urine mg/day-CATEUR: 1.12 g/(24.h) (ref 0.50–2.15)
Dopamine 24 Hr Urine: 98 ug/(24.h) (ref 52–480)
Norepinephrine, 24H, Ur: 37 ug/(24.h) (ref 15–100)
Total Volume: 1200 mL

## 2024-04-23 ENCOUNTER — Ambulatory Visit: Admitting: Gastroenterology

## 2024-04-23 ENCOUNTER — Encounter: Payer: Self-pay | Admitting: Gastroenterology

## 2024-04-23 VITALS — HR 86 | Temp 98.1°F | Ht 62.0 in | Wt 166.0 lb

## 2024-04-23 DIAGNOSIS — R143 Flatulence: Secondary | ICD-10-CM

## 2024-04-23 DIAGNOSIS — K449 Diaphragmatic hernia without obstruction or gangrene: Secondary | ICD-10-CM

## 2024-04-23 MED ORDER — SODIUM CHLORIDE 0.9 % IV SOLN: 500.0000 mL | INTRAVENOUS | Status: AC

## 2024-04-23 NOTE — Progress Notes (Unsigned)
 After multiple blood pressure checks, patient's BP remained high, with readings of 220/120, 230/100, 240/94, and 210/96. Patient stated that she has been working with cardiologists to manage her highly fluctuating BP.   Dr. Shila talked with patient, explaining that due to current blood pressure readings, we will need to schedule patient for procedure at the hospital. Patient stated understanding.    RN notified patient caregiver/driver. Patient safely escorted back to her caregiver/driver to return back home.

## 2024-04-29 ENCOUNTER — Encounter: Payer: Self-pay | Admitting: Family Medicine

## 2024-04-30 NOTE — Progress Notes (Unsigned)
 [facility] [address] [place] [address] Phone: [phone] Subjective:   Regina Marsh, am serving as a scribe for Dr. Arthea Claudene.  I'm seeing this patient by the request  of:  Chrystal Lamarr RAMAN, MD  CC: neck pain follow up   YEP:Dlagzrupcz  03/12/2024 Once again we will get imaging.  Mostly of the neck with patient having this previously as numbing symptoms.  Was unfortunately had the loss of the urine catecholamines.  Will get another 24-hour collection when patient has symptoms     Continues to have normal findings on exam.  Concerned that it is still potentially her neck.  Discussed the potential MR angiogram of the neck to further evaluate for any type of vascular compromise that could be potential.  We discussed an MR angiogram of the head as well that I think will be beneficial.  Discussed with findings of anything we will refer accordingly.  Continues to have the episodic lightheadedness as well which is difficult.     Update 05/01/2024 Regina Marsh is a 78 y.o. female coming in with complaint of neck pain. Patient states that she has hot flashes, light headedness, fatigue, loss of appetite with Cymbalta . Pain less at night with medication. Brain fog also improved. Stopped cymbalta  prior to endocscopy but started taking it again due to increase in her leg pain.   Had difficult duloxetine  Endoscopy unable to do to high blood pressure     Past Medical History:  Diagnosis Date   GERD (gastroesophageal reflux disease)    Headache    Hiatal hernia    Hyperlipidemia    Hypertension    Low back pain    Sjogren's disease [facility])    Past Surgical History:  Procedure Laterality Date   BREAST LUMPECTOMY Bilateral    left-1966, right-2001   CERVICAL SPINE SURGERY  11/03/2021   PLATE AND SCREWS, Dr Mavis   CESAREAN SECTION  1984   detached eye lid Left 2011   LAPAROSCOPY     x 2, for adhesions, 1989 and 1990   NASAL SINUS  SURGERY  2006   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   REVERSE SHOULDER ARTHROPLASTY Right 02/09/2022   Procedure: REVERSE SHOULDER ARTHROPLASTY;  Surgeon: Cristy Bonner DASEN, MD;  Location: [facility];  Service: Orthopedics;  Laterality: Right;   SALIVARY GLAND SURGERY Left 1968   TONSILLECTOMY AND ADENOIDECTOMY  1951   Social History   Socioeconomic History   Marital status: Married    Spouse name: Radio producer   Number of children: 1   Years of education: Not on file   Highest education level: Not on file  Occupational History   Occupation: retired Runner, broadcasting/film/video  Tobacco Use   Smoking status: Never   Smokeless tobacco: Never  Vaping Use   Vaping status: Never Used  Substance and Sexual Activity   Alcohol use: Not Currently   Drug use: Never   Sexual activity: Not Currently    Birth control/protection: Post-menopausal  Other Topics Concern   Not on file  Social History Narrative   Right handed    Lives in a two story home    Social Drivers of Health   Financial Resource Strain: Not on file  Food Insecurity: Not on file  Transportation Needs: Not on file  Physical Activity: Not on file  Stress: Not on file  Social Connections: Not on file   Allergies  Allergen Reactions   Valsartan Swelling and Cough  Myalgias,  tongue swelling   Aleve [Naproxen] Other (See Comments)    Causes severe bruising   Allegra [Fexofenadine] Other (See Comments)    Abdominal pain   Amlodipine  Other (See Comments)    lightheadedness   Cetirizine Hcl Other (See Comments)    Made her hyper - caused insomnia   Chlorthalidone  Other (See Comments)    lightheadedness   Hydralazine  Nausea And Vomiting    Leg pain, joint pain   Lactose Intolerance (Gi) Diarrhea   Lisinopril  Swelling    Caused eyes to swell   Loratadine Other (See Comments)    Made her hyper - caused insomnia   Pravastatin Other (See Comments)    Leg pain, loss of memory   Simvastatin Other (See Comments)    Leg pain, loss of memory   Sulfa  Antibiotics Nausea And Vomiting   African Mango [Irvingia Gabonensis] Swelling, Rash and Other (See Comments)    Blisters in mouth and tongue swelling   Penicillins Swelling and Rash    Tongue swelling   Spironolactone  Rash   Family History  Problem Relation Age of Onset   Hypertension Mother    Heart attack Mother    Stroke Father    Leukemia Father    Allergic rhinitis Neg Hx    Angioedema Neg Hx    Asthma Neg Hx    Atopy Neg Hx    Eczema Neg Hx    Immunodeficiency Neg Hx    Urticaria Neg Hx    Colon cancer Neg Hx    Esophageal cancer Neg Hx    Rectal cancer Neg Hx    Stomach cancer Neg Hx      Current Outpatient Medications (Cardiovascular):    doxazosin  (CARDURA ) 1 MG tablet, Take 3 tablets (3 mg total) by mouth at bedtime.  Current Outpatient Medications (Respiratory):    diphenhydrAMINE (BENADRYL) 25 MG tablet, Take 0.5 tablets by mouth daily as needed (allergies).  Current Outpatient Medications (Analgesics):    acetaminophen  (TYLENOL ) 325 MG tablet, Take 650 mg by mouth as needed for moderate pain, mild pain or headache.   aspirin  EC 81 MG tablet, Take 81 mg by mouth daily. Swallow whole.   Current Outpatient Medications (Other):    Bismuth Subsalicylate (PEPTO BISMOL PO), Take by mouth.   Cholecalciferol (VITAMIN D3) 50 MCG (2000 UT) capsule, Take 4,000 Units by mouth daily. Take 2 tablets daily   D-MANNOSE PO, Take 50 mg by mouth daily.   DULoxetine  (CYMBALTA ) 20 MG capsule, Take 1 capsule (20 mg total) by mouth daily.   famotidine  (PEPCID ) 20 MG tablet, Take 1 tablet (20 mg total) by mouth 2 (two) times daily.   L-THEANINE PO, Take 1 tablet by mouth daily as needed (pain).   Lactase (LACTAID PO), Take 1-3 tablets by mouth 3 (three) times daily as needed (when consuming milk products).   MAGNESIUM GLYCINATE PO, Take 400 mg by mouth daily.   Menatetrenone (VITAMIN K2) 100 MCG TABS, Take 200 mcg by mouth daily.   methocarbamol  (ROBAXIN ) 500 MG tablet, Take 1  tablet (500 mg total) by mouth at bedtime.   Polyethyl Glycol-Propyl Glycol (SYSTANE) 0.4-0.3 % GEL ophthalmic gel, Place 1 Application into both eyes. Once daily at bedtime.   Probiotic Product (PROBIOTIC BLEND PO), Take 1 tablet by mouth daily.   Propylene Glycol (SYSTANE BALANCE OP), Place 1 drop into both eyes 3 (three) times daily as needed (dry eyes).   PSYLLIUM PO, Take 1 capsule by mouth daily at 12 noon.  Simethicone (GAS-X PO), Take 1 tablet by mouth daily as needed (flatulance).   TART CHERRY PO, Take 1,200 mg by mouth at bedtime. Take two tablets at bedtime.   Turmeric Curcumin 500 MG CAPS, Take 500 mg by mouth daily.   Ubiquinol 100 MG CAPS, Take 1 capsule by mouth in the morning and at bedtime.   VITAMIN C, CALCIUM ASCORBATE, PO, Take 350 mg by mouth daily.   Reviewed prior external information including notes and imaging from  primary care provider As well as notes that were available from care everywhere and other healthcare systems.  Past medical history, social, surgical and family history all reviewed in electronic medical record.  No pertanent information unless stated regarding to the chief complaint.   Review of Systems:  No , visual changes, nausea, vomiting, diarrhea, constipation,  abdominal pain, skin rash, fevers, chills, night sweats, weight loss, swollen lymph nodes, body aches, joint swelling, chest pain, shortness of breath, mood changes. POSITIVE muscle aches, headache, dizziness  Objective  Blood pressure (!) 158/112, pulse 76, height 5' 2 (1.575 m), weight 169 lb (76.7 kg), SpO2 97%.   General: No apparent distress alert and oriented x3 mood and affect normal, dressed appropriately.  HEENT: Pupils equal, extraocular movements intact  Respiratory: Patient's speak in full sentences and does not appear short of breath  Cardiovascular: No lower extremity edema, non tender, no erythema  Neck still has significant loss of lordosis.  Difficulty with extension.   Patient does have a mild wide-based gait noted.  Able to have a good discussion with patient today.    Impression and Recommendations:    The above documentation has been reviewed and is accurate and complete Aoife Bold M Mariam Helbert, DO

## 2024-05-01 ENCOUNTER — Ambulatory Visit: Payer: Self-pay | Admitting: Family Medicine

## 2024-05-01 ENCOUNTER — Ambulatory Visit: Admitting: Family Medicine

## 2024-05-01 VITALS — BP 158/112 | HR 76 | Ht 62.0 in | Wt 169.0 lb

## 2024-05-01 DIAGNOSIS — M2550 Pain in unspecified joint: Secondary | ICD-10-CM

## 2024-05-01 DIAGNOSIS — R42 Dizziness and giddiness: Secondary | ICD-10-CM | POA: Diagnosis not present

## 2024-05-01 DIAGNOSIS — I10 Essential (primary) hypertension: Secondary | ICD-10-CM | POA: Diagnosis not present

## 2024-05-01 LAB — SEDIMENTATION RATE: Sed Rate: 30 mm/h (ref 0–30)

## 2024-05-01 LAB — PROLACTIN: Prolactin: 10.5 ng/mL

## 2024-05-01 MED ORDER — METHOCARBAMOL 500 MG PO TABS: 500.0000 mg | ORAL_TABLET | Freq: Every evening | ORAL | 0 refills | Status: DC

## 2024-05-01 NOTE — Assessment & Plan Note (Signed)
 Continues to have significant difficulty.  We have worked the patient for MRI of the brain as well as the neck showing no vascular compromise that could be potentially contributing.  Tried the Cymbalta  that did help with the leg discomfort at night but unfortunately had too many side effects to continue.  At this point I do think we need to consider sleep study with patient's symptoms to be demand and this could contribute to some of the other daytime symptoms such as the fatigue and potentially lightheadedness.  Patient has a significant amount of stress as well which is concerning.  Would like to wait on any other changes in the medication.  We do need to consider repeating some labs as well we discussed.  Patient is going to follow-up with the other providers as well.  Follow-up with me again in 2 to 3 months.  Total time reviewing the chart and discussing with her as well as her significant other 41 minutes

## 2024-05-01 NOTE — Patient Instructions (Addendum)
 Robaxin  500mg  at night Stop duloxetine  Sleep study Labs today See you again in 8 weeks

## 2024-05-01 NOTE — Assessment & Plan Note (Signed)
 Continues to have difficulty with her blood pressure.  Significantly elevated again today.  Has times of when it is lower.  Patient has to follow-up with primary care relatively urgently.  He is seeing cardiology in the near future as well.  Patient knows of worsening symptoms such as headaches, dizziness lightheadedness to seek medical attention immediately.

## 2024-05-09 ENCOUNTER — Other Ambulatory Visit: Payer: Self-pay | Admitting: *Deleted

## 2024-05-09 ENCOUNTER — Encounter: Payer: Self-pay | Admitting: *Deleted

## 2024-05-09 ENCOUNTER — Telehealth: Payer: Self-pay | Admitting: *Deleted

## 2024-05-09 DIAGNOSIS — K449 Diaphragmatic hernia without obstruction or gangrene: Secondary | ICD-10-CM

## 2024-05-09 DIAGNOSIS — R9389 Abnormal findings on diagnostic imaging of other specified body structures: Secondary | ICD-10-CM

## 2024-05-09 LAB — ADH: ADH: 1.5 pg/mL (ref 0.0–4.7)

## 2024-05-09 NOTE — Telephone Encounter (Signed)
 October 28 th first available   Scheduled at 9 am Case Number #[id number]   Will contact patient and mail instructions

## 2024-05-09 NOTE — Telephone Encounter (Signed)
===  View-only below this line=== ----- Message ----- From: Shila Gustav GAILS, MD Sent: 04/24/2024   5:06 PM EDT To: Grayce CHRISTELLA Loge, CMA; Harlene BIRCH Zehr, PA-C Subject: FW: White coat hypertension RICK Grayce, please schedule her procedure at hospital next available appt, her BP was extremely high, we couldn't do her procedure. thanks ----- Message ----- From: Cayetano Harlene BIRCH, PA-C Sent: 04/19/2024  12:52 PM EDT To: Gustav Shila GAILS, MD Subject: FW: White coat hypertension FYI                Just FYI for upcoming procedure next week. ----- Message ----- From: Vannie Reche RAMAN, NP Sent: 04/19/2024  12:09 PM EDT To: Bonner ONEIDA Hair, MD; Harlene BIRCH Cayetano, PA-C Subject: White coat hypertension FYI                    Hi Dr. Hair and Harlene,  I am sure you are already aware, but Regina Marsh asked our team to reach out. 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 When I last saw her 01/29/24 initial BP 200/100 with repeat 164/77 without intervention. More recent clinic visits with Sports Med 03/12/24 BP 142/96 and with Harlene 03/07/24 130/80. She sent us  BP readings recently with average reading of 156/71. She has a number of medication intolerances (copied below for your reference) and has previously declined renal denervation.   I did ask her to return to Doxazosin  3mg  at bedtime dose (she had self reduced to 2mg  at bedtime) to improve BP control prior to her procedure. She does usually see improved BP readings in clinical settings if she sits and rests. I did reiterate to her that whether or not to proceed with procedure based on BP was up to your team and anesthesiology which is why adjusting Doxazosin  to 3mg  was important.   Let me know if you have questions.   Best,  Reche RAMAN Vannie, NP   _______  Previous hypertensives Spironolactone : rash Beta blockers: felt terrible (remotely) Amlodipine : lightheadedness Minoxidil : face/tongue  swelling Lisinopril : lightheadedness, stomach pain Valsartan: lightheadedness, nausea, stomach pain Chlorthalidone : lightheadedness, nausea Hydralazine : joint pain and fatigue Doxazosin : 4mg  dose (tolerating 2mg ) Hydrochlorothiazide  -exacerbated Sjogren's, increased uric acid

## 2024-05-09 NOTE — Telephone Encounter (Signed)
 Spoke with Dr Nandigam and she said Oct is fine to schedule then, Oct schedule is out.  Dx  Abnormal Imaging, Hiatal Hernia Dysphagia

## 2024-05-21 ENCOUNTER — Encounter (HOSPITAL_BASED_OUTPATIENT_CLINIC_OR_DEPARTMENT_OTHER): Payer: Self-pay | Admitting: Cardiology

## 2024-05-21 ENCOUNTER — Ambulatory Visit (HOSPITAL_BASED_OUTPATIENT_CLINIC_OR_DEPARTMENT_OTHER): Admitting: Cardiology

## 2024-05-21 VITALS — BP 226/92 | HR 81 | Ht 62.0 in | Wt 172.1 lb

## 2024-05-21 DIAGNOSIS — I2510 Atherosclerotic heart disease of native coronary artery without angina pectoris: Secondary | ICD-10-CM

## 2024-05-21 DIAGNOSIS — E782 Mixed hyperlipidemia: Secondary | ICD-10-CM | POA: Diagnosis not present

## 2024-05-21 DIAGNOSIS — Z789 Other specified health status: Secondary | ICD-10-CM | POA: Diagnosis not present

## 2024-05-21 DIAGNOSIS — R42 Dizziness and giddiness: Secondary | ICD-10-CM

## 2024-05-21 DIAGNOSIS — R0989 Other specified symptoms and signs involving the circulatory and respiratory systems: Secondary | ICD-10-CM

## 2024-05-21 NOTE — Patient Instructions (Signed)
 Medication Instructions:   Your physician recommends that you continue on your current medications as directed. Please refer to the Current Medication list given to you today.   *If you need a refill on your cardiac medications before your next appointment, please call your pharmacy*  Lab Work:  None ordered.   If you have labs (blood work) drawn today and your tests are completely normal, you will receive your results only by: MyChart Message (if you have MyChart) OR A paper copy in the mail If you have any lab test that is abnormal or we need to change your treatment, we will call you to review the results.  Testing/Procedures:  None ordered.  Follow-Up: At [facility], you and your health needs are our priority.  As part of our continuing mission to provide you with exceptional heart care, our providers are all part of one team.  This team includes your primary Cardiologist (physician) and Advanced Practice Providers or APPs (Physician Assistants and Nurse Practitioners) who all work together to provide you with the care you need, when you need it.  Your next appointment:   6 month(s)  Provider:   Shelda Bruckner, MD or Reche Finder, NP    We recommend signing up for the patient portal called MyChart.  Sign up information is provided on this After Visit Summary.  MyChart is used to connect with patients for Virtual Visits (Telemedicine).  Patients are able to view lab/test results, encounter notes, upcoming appointments, etc.  Non-urgent messages can be sent to your provider as well.   To learn more about what you can do with MyChart, go to [email or web address].   Other Instructions  Your physician wants you to follow-up in: 6 months.  You will receive a reminder letter in the mail two months in advance. If you don't receive a letter, please call our office to schedule the follow-up appointment.

## 2024-05-21 NOTE — Progress Notes (Signed)
 Cardiology Office Note:  .   Date:  05/21/2024  ID:  ALYZE LAUF, DOB [birth year], MRN [id number] PCP: Chrystal Lamarr RAMAN, MD  [facility] Providers Cardiologist:  Shelda Bruckner, MD {  History of Present Illness: Regina   JAYLEANA Marsh is a 78 y.o. female with a hx of Sjogren's, labile/resistant hypertension, hyperlipidemia, and GERD, here for follow up. I met her 06/23/20 as a new consult at the request of Chrystal Lamarr RAMAN, * for the evaluation and management of resistant hypertension.   Cardiac history:  metanephrines that are within normal limits (metanephrine 48, normetanephrine 141). ER note from 03/27/20. Noted to have elevated blood pressure after she had nasal packing for a nosebleed. No associated symptoms. BP noted as 214/91 at that time. Long history of drug intolerances, see summary below. Renal duplex 06/2022 without stenosis. Echo 01/2022 EF 60-65%, mild LVH, grade 1 DD,  normal EV, no significant valve disease. Ca score 2022 27.7 (42nd %ile). Monitor 2021 with rare brief SVT, 19 events were sinus (3 with PAC or PVC). Amb BP monitoring 2021 showed hypertension throughout study, mean 176/79, no significant drop with sleep.  Today: Blood pressure remains very elevated. Reports that this continues to be labile for her. This morning, was 204 systolic, then was 103 systolic. Elevated on our initial check here. Has a lot of stress with severely ill new grandson who has had many health issues/surgeries since birth. Has been in the hospital for over two months straight. Husband also dealing with advanced prostate cancer, struggling with hormone depletion symptoms, which is also stressful. Overall feels that she is not sleeping well, constantly stressed, which is not helping her symptoms.  Having a lot of lightheadedness, three severe episodes. Feels that the more active she is, the less lightheaded she is. Home reading when lightheaded was ~150/70. No syncope recently  (had very remotely). Working with Dr. Claudene on balance/pain MSK issues. Had thiazide stopped, no issues with fluid retention. She feels like she is actually holding onto less fluid now since this was stopped.   Having a lot of hot flashes with diaphoresis, which is also associated with her elevated blood pressure episodes. These were the primary issue when I first met her in 2021; she feels these are getting more severe. Tried cymbalta , improved leg pain and headaches, but had fatigue/brain fog and lightheadedness. She does not recall if this helped hot flashes.  Current BP regimen: Doxazosin  2 mg at bedtime  Blood pressure log reviewed. Ranges 132/69, most 140s-150s/70s-80s, occasional 160s systolic and one reading of 207/101 (noted during a hot flash) with immediate repeat 130/80.   ROS: Denies chest pain, shortness of breath at rest or with normal exertion. No PND, orthopnea, LE edema or unexpected weight gain. No syncope or palpitations. ROS otherwise negative except as noted.   Studies Reviewed: Regina    EKG:       Physical Exam:   VS:  BP (!) 226/92   Pulse 81   Ht 5' 2 (1.575 m)   Wt 172 lb 1.6 oz (78.1 kg)   SpO2 96%   BMI 31.48 kg/m    Wt Readings from Last 3 Encounters:  05/21/24 172 lb 1.6 oz (78.1 kg)  05/01/24 169 lb (76.7 kg)  04/23/24 166 lb (75.3 kg)    GEN: Well nourished, well developed in no acute distress HEENT: Normal, moist mucous membranes NECK: No JVD CARDIAC: regular rhythm, normal S1 and S2, no rubs or gallops. No murmur. VASCULAR:  Radial and DP pulses 2+ bilaterally. No carotid bruits RESPIRATORY:  Clear to auscultation without rales, wheezing or rhonchi  ABDOMEN: Soft, non-tender, non-distended MUSCULOSKELETAL:  Ambulates independently. L Posterior shoulder with defined mass, reports soreness SKIN: Warm and dry, no edema NEUROLOGIC:  Alert and oriented x 3. No focal neuro deficits noted. PSYCHIATRIC:  Normal affect    ASSESSMENT AND PLAN: .     Hypertension, labile -see extensive prior discussion. We discussed both that her baseline BP is above goal (hence needing standing medications) and that she has very high spikes with triggers (likely sympathetic driven). She feels that headaches, leg pain, allergies are the main issues driving her blood pressure. We discussed that these can be triggers, but also need to control blood pressure at baseline and with spikes -has many food sensitivities that affect her symptoms as well -stress, pain, illness, hot flashes, migraines are triggers. -has tolerated minimal medications, see below -appreciate assistance from advanced hypertension clinic -have previously used hydralazine  10 mg q6 hours PRN for systolic BP >160 but felt that this dropped her blood pressure so low she almost went to the ER (with 10 mg dose) -tried PRN propranolol  low dose, also dropped her blood pressure and made her lightheaded -we discussed other alternatives, including imdur and clonidine. Concern with imdur would be worsening headaches and lightheadedness. Concern with clonidine given risk of rebound hypertension if stopped suddenly, worsening fatigue.  -we have discussed options and strategies for medication management. She wants to focus on diet changes and pain management rather than adjusting medications -we reviewed very high blood pressure readings and risk of stroke. We have tried several PRN medications for very high blood pressures and she has not tolerated.  -I discussed that very high blood pressures will be a concern for anesthesia. She has tolerated anesthesia well for her shoulder surgery and feels that overall her blood pressure numbers shouldn't preclude anesthesia. While her home numbers are largely stable (if above goal), she has significant white coat component and can have severely elevated BP (see initial BP 238/118 today). I am concerned that this may cause her cases to be cancelled. We discussed PRN  medications again, she declines.    Intolerances: Spironolactone : rash beta blockers: felt terrible (remotely). Did not tolerate PRN propranolol  Amlodipine : lightheadedness Minoxidil : face/tongue swelling Lisinopril : lightheadedness, stomach pain Valsartan: lightheadedness, nausea, stomach pain Doxazosin : did not tolerate more than 2 mg, severe lightheadedness. Tried again recently and did not tolerate 3 mg dose hydrochlorothiazide : did not tolerate higher dose, eye pain/dryness. Now off of completely and feels better Chlorthalidone : lightheadedness Hydralazine : lightheadedness, even very low dose bottomed out blood pressure   -lab workup for secondary causes summarized in HPI.  -see prior discussion, concern for hormonal dumping, long Covid, dysautonomia. These seem more related to sympathetic triggers, see discussion above re: beta blockers.   Mixed hyperlipidemia Coronary calcification -She has not tolerated statins. See prior extensive discussions re: nonstatin options, she declines. -calcium score 05/07/21 showed Ca score=27.7  CV risk counseling and prevention -recommend heart healthy/Mediterranean diet, with whole grains, fruits, vegetable, fish, lean meats, nuts, and olive oil. Limit salt. -recommend moderate walking, 3-5 times/week for 30-50 minutes each session. Aim for at least 150 minutes.week. Goal should be pace of 3 miles/hours, or walking 1.5 miles in 30 minutes -recommend avoidance of tobacco products. Avoid excess alcohol.  Dispo: 6 months or sooner as needed  Total time of encounter: I spent 45 minutes dedicated to the care of this patient on the date  of this encounter to include pre-visit review of records, face-to-face time with the patient discussing conditions above, and clinical documentation with the electronic health record. We specifically spent time today discussing symptoms, blood pressure goals, triggers/pattern with sympathetic triggers, options for  management, signs/symptoms to watch for.   Signed, Shelda Bruckner, MD   Shelda Bruckner, MD, PhD, [facility]  [facility]  [place]  [facility] at [facility] at [facility] [address], [address] [place], [place] [address] [phone]

## 2024-05-22 ENCOUNTER — Encounter: Payer: Self-pay | Admitting: *Deleted

## 2024-05-22 NOTE — Telephone Encounter (Signed)
 Patients instructions have been printed and mailed, AMB referral completed. This is a repeat EGD due to high BP during last EGD   Called patient and she has received all her information through My Chart. Will contact the office if any further questions

## 2024-05-31 DIAGNOSIS — M2559 Pain in other specified joint: Secondary | ICD-10-CM | POA: Diagnosis not present

## 2024-05-31 DIAGNOSIS — M3501 Sicca syndrome with keratoconjunctivitis: Secondary | ICD-10-CM | POA: Diagnosis not present

## 2024-05-31 DIAGNOSIS — M5030 Other cervical disc degeneration, unspecified cervical region: Secondary | ICD-10-CM | POA: Diagnosis not present

## 2024-06-25 NOTE — Progress Notes (Unsigned)
 [facility] [address] [place] [address] Phone: [phone] Subjective:   ISusannah Marsh, am serving as a scribe for Dr. Arthea Claudene.  I'm seeing this patient by the request  of:  Chrystal Lamarr RAMAN, MD  CC: Neck pain follow-up  YEP:Dlagzrupcz  05/01/2024 Continues to have significant difficulty.  We have worked the patient for MRI of the brain as well as the neck showing no vascular compromise that could be potentially contributing.  Tried the Cymbalta  that did help with the leg discomfort at night but unfortunately had too many side effects to continue.  At this point I do think we need to consider sleep study with patient's symptoms to be demand and this could contribute to some of the other daytime symptoms such as the fatigue and potentially lightheadedness.  Patient has a significant amount of stress as well which is concerning.  Would like to wait on any other changes in the medication.  We do need to consider repeating some labs as well we discussed.  Patient is going to follow-up with the other providers as well.  Follow-up with me again in 2 to 3 months.  Total time reviewing the chart and discussing with her as well as her significant other 41 minutes      Update 06/26/2024 Regina Marsh is a 78 y.o. female coming in with complaint of neck pain. Patient states the muscle relaxer didn't work, no pain but couldn't walk. Hasn't done sleep study. L shoulder cyst needs to be drained? Aug 4th had problems walking bt it was different, made her dizzy rather than legs buckling. Blood pressure is fluctuating.    Scheduled for an EGD on October 28.  Sedimentation rate and CRP normal.  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   REVERSE SHOULDER ARTHROPLASTY Right 02/09/2022   Procedure: REVERSE SHOULDER ARTHROPLASTY;  Surgeon: Cristy Bonner DASEN, MD;  Location: [facility];  Service: Orthopedics;  Laterality: Right;   SALIVARY GLAND SURGERY Left 1968   TONSILLECTOMY AND ADENOIDECTOMY  1951   Social History   Socioeconomic History   Marital status: Married    Spouse name: Radio producer   Number of children: 1   Years of education: Not on file   Highest education level: Not on file  Occupational History   Occupation: retired Runner, broadcasting/film/video  Tobacco Use   Smoking status: Never   Smokeless tobacco: Never  Vaping Use   Vaping status: Never Used  Substance and Sexual Activity   Alcohol use: Not Currently   Drug use: Never   Sexual activity: Not Currently    Birth control/protection: Post-menopausal  Other Topics Concern   Not on file  Social History Narrative   Right handed    Lives in a two story home    Social Drivers of Health   Financial Resource Strain:  Not on file  Food Insecurity: Not on file  Transportation Needs: Not on file  Physical Activity: Not on file  Stress: Not on file  Social Connections: Not on file   Allergies  Allergen Reactions   Valsartan Swelling and Cough    Myalgias,  tongue swelling   Aleve [Naproxen] Other (See Comments)    Causes severe bruising   Allegra [Fexofenadine] Other (See Comments)    Abdominal pain   Amlodipine  Other (See Comments)    lightheadedness   Cetirizine Hcl Other (See Comments)    Made her hyper - caused insomnia   Chlorthalidone  Other (See Comments)    lightheadedness   Hydralazine  Nausea And Vomiting    Leg pain, joint pain   Lactose Intolerance (Gi) Diarrhea   Lisinopril  Swelling     Caused eyes to swell   Loratadine Other (See Comments)    Made her hyper - caused insomnia   Pravastatin Other (See Comments)    Leg pain, loss of memory   Simvastatin Other (See Comments)    Leg pain, loss of memory   Sulfa Antibiotics Nausea And Vomiting   African Mango [Irvingia Gabonensis] Swelling, Rash and Other (See Comments)    Blisters in mouth and tongue swelling   Penicillins Swelling and Rash    Tongue swelling   Spironolactone  Rash   Family History  Problem Relation Age of Onset   Hypertension Mother    Heart attack Mother    Stroke Father    Leukemia Father    Allergic rhinitis Neg Hx    Angioedema Neg Hx    Asthma Neg Hx    Atopy Neg Hx    Eczema Neg Hx    Immunodeficiency Neg Hx    Urticaria Neg Hx    Colon cancer Neg Hx    Esophageal cancer Neg Hx    Rectal cancer Neg Hx    Stomach cancer Neg Hx      Current Outpatient Medications (Cardiovascular):    doxazosin  (CARDURA ) 1 MG tablet, Take 3 tablets (3 mg total) by mouth at bedtime.  Current Outpatient Medications (Respiratory):    diphenhydrAMINE (BENADRYL) 25 MG tablet, Take 0.5 tablets by mouth daily as needed (allergies).  Current Outpatient Medications (Analgesics):    acetaminophen  (TYLENOL ) 325 MG tablet, Take 650 mg by mouth as needed for moderate pain, mild pain or headache.   aspirin  EC 81 MG tablet, Take 81 mg by mouth daily. Swallow whole.   Current Outpatient Medications (Other):    Bismuth Subsalicylate (PEPTO BISMOL PO), Take by mouth.   Cholecalciferol (VITAMIN D3) 50 MCG (2000 UT) capsule, Take 4,000 Units by mouth daily. Take 2 tablets daily   D-MANNOSE PO, Take 50 mg by mouth daily.   famotidine  (PEPCID ) 20 MG tablet, Take 1 tablet (20 mg total) by mouth 2 (two) times daily.   L-THEANINE PO, Take 1 tablet by mouth daily as needed (pain).   Lactase (LACTAID PO), Take 1-3 tablets by mouth 3 (three) times daily as needed (when consuming milk products).   MAGNESIUM GLYCINATE PO,  Take 400 mg by mouth daily.   Menatetrenone (VITAMIN K2) 100 MCG TABS, Take 200 mcg by mouth daily.   methocarbamol  (ROBAXIN ) 500 MG tablet, Take 1 tablet (500 mg total) by mouth at bedtime.   Polyethyl Glycol-Propyl Glycol (SYSTANE) 0.4-0.3 % GEL ophthalmic gel, Place 1 Application into both eyes. Once daily at bedtime.   Probiotic Product (PROBIOTIC BLEND PO), Take 1 tablet by mouth daily.   Propylene  Glycol (SYSTANE BALANCE OP), Place 1 drop into both eyes 3 (three) times daily as needed (dry eyes).   PSYLLIUM PO, Take 1 capsule by mouth daily at 12 noon.   Simethicone (GAS-X PO), Take 1 tablet by mouth daily as needed (flatulance).   TART CHERRY PO, Take 1,200 mg by mouth at bedtime. Take two tablets at bedtime.   Turmeric Curcumin 500 MG CAPS, Take 500 mg by mouth daily.   Ubiquinol 100 MG CAPS, Take 1 capsule by mouth in the morning and at bedtime.   VITAMIN C, CALCIUM ASCORBATE, PO, Take 350 mg by mouth daily.   Reviewed prior external information including notes and imaging from  primary care provider As well as notes that were available from care everywhere and other healthcare systems.  Past medical history, social, surgical and family history all reviewed in electronic medical record.  No pertanent information unless stated regarding to the chief complaint.   Review of Systems:  No visual changes, nausea, vomiting, diarrhea, constipation, dizziness, abdominal pain, sweight loss, swollen lymph nodes, body aches, joint swelling, chest pain, shortness of breath, mood changes. POSITIVE muscle aches, dizziness, headache, abdominal pain  Objective  Blood pressure 136/88, pulse 61, height 5' 2 (1.575 m), weight 170 lb (77.1 kg), SpO2 96%.   General: No apparent distress alert and oriented x3 mood and affect normal, dressed appropriately.  HEENT: Pupils equal, extraocular movements intact  Respiratory: Patient's speak in full sentences and does not appear short of breath   Cardiovascular: No lower extremity edema, non tender, no erythema  Left shoulder exam has relatively good range of motion noted.  Patient though on inspection does have some fullness noted that seems to be superficial to the scapula.  Freely movable and tender.  Limited muscular skeletal ultrasound was performed and interpreted by CLAUDENE HUSSAR, M  Limited ultrasound shows that patient does have what appears to be a mass with fatty infiltrates noted.  No abnormal blood flow noted.  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

## 2024-06-26 ENCOUNTER — Ambulatory Visit (INDEPENDENT_AMBULATORY_CARE_PROVIDER_SITE_OTHER): Admitting: Family Medicine

## 2024-06-26 ENCOUNTER — Other Ambulatory Visit: Payer: Self-pay

## 2024-06-26 ENCOUNTER — Encounter: Payer: Self-pay | Admitting: Family Medicine

## 2024-06-26 VITALS — BP 136/88 | HR 61 | Ht 62.0 in | Wt 170.0 lb

## 2024-06-26 DIAGNOSIS — D171 Benign lipomatous neoplasm of skin and subcutaneous tissue of trunk: Secondary | ICD-10-CM | POA: Diagnosis not present

## 2024-06-26 DIAGNOSIS — M5010 Cervical disc disorder with radiculopathy, unspecified cervical region: Secondary | ICD-10-CM

## 2024-06-26 DIAGNOSIS — M25512 Pain in left shoulder: Secondary | ICD-10-CM | POA: Diagnosis not present

## 2024-06-26 DIAGNOSIS — E559 Vitamin D deficiency, unspecified: Secondary | ICD-10-CM

## 2024-06-26 NOTE — Assessment & Plan Note (Signed)
Encouraged continued supplementation

## 2024-06-26 NOTE — Patient Instructions (Signed)
 Flonase 1 spray each nostril everyday for 2 weeks Monitor lipoma Interested to see what's found on sleep study See you again in 3 months

## 2024-06-26 NOTE — Assessment & Plan Note (Signed)
 DDD noted.  Discussed icing regimen and home exercises, discussed which activities to do and which ones to avoid.  Increase activity slowly.  Will continue to monitor.  No change in management at this time

## 2024-06-26 NOTE — Assessment & Plan Note (Addendum)
 Left shoulder blade measures 10 x 6 cm.  Relatively large.  On ultrasound no type of communication with the underlying musculature.  Patient would like to continue to monitor it.  Can repeat ultrasound in the next 6 months to further evaluate if any changing in size.  Depending on findings we will discussed otherwise.  Follow-up again 6 to 8 weeks.  Worsening symptoms would need to be referred to general surgery

## 2024-07-30 ENCOUNTER — Encounter (HOSPITAL_COMMUNITY): Payer: Self-pay | Admitting: Gastroenterology

## 2024-07-30 NOTE — Progress Notes (Signed)
 Pre op call Regina Marsh   PCP-Timberlake MD Cardiologist-Christopher MD  Pulmonologist- n/a  EKG-01/29/24 Echo-02/04/22 Cath-n/a Stress-n/a ICD/PM-n/a GLP1-n/a Blood Thinner-n/a  History: HTN, Sjrogens, White Coat Syndrome. Patient last saw her cardiologist back in 05/2024 at that appt they were addressing her labile BP. At that appt it was in the 200s/90s, she wasn't symptomatic and MD did tell her they could try other meds but she didn't want to. When she went to her pcp 9/17 her BP was in the 130s/80s. She says her BP can change within minutes and she actually feels worse when its low vs high. She did inform me she had a procedure at [facility] in May on 2023 and went okay.  Anesthesia Review- Yes- okay will have to eval DOS

## 2024-08-02 ENCOUNTER — Telehealth: Payer: Self-pay

## 2024-08-02 DIAGNOSIS — D179 Benign lipomatous neoplasm, unspecified: Secondary | ICD-10-CM | POA: Diagnosis not present

## 2024-08-02 DIAGNOSIS — R9089 Other abnormal findings on diagnostic imaging of central nervous system: Secondary | ICD-10-CM | POA: Diagnosis not present

## 2024-08-02 DIAGNOSIS — R0989 Other specified symptoms and signs involving the circulatory and respiratory systems: Secondary | ICD-10-CM | POA: Diagnosis not present

## 2024-08-02 DIAGNOSIS — R260 Ataxic gait: Secondary | ICD-10-CM | POA: Diagnosis not present

## 2024-08-02 NOTE — Telephone Encounter (Signed)
 Procedure:EGD Procedure date: 08/06/24 Procedure location: [facility] Arrival Time: 7:00 Spoke with the patient Y/N: Y Any prep concerns? N  Has the patient obtained the prep from the pharmacy ? N Do you have a care partner and transportation: Y Any additional concerns? N

## 2024-08-05 NOTE — Anesthesia Preprocedure Evaluation (Signed)
 Anesthesia Evaluation  Patient identified by MRN, date of birth, ID band Patient awake    Reviewed: Allergy & Precautions, NPO status , Patient's Chart, lab work & pertinent test results  Airway Mallampati: IV  TM Distance: >3 FB Neck ROM: Full    Dental no notable dental hx. (+) Dental Advisory Given, Teeth Intact   Pulmonary neg pulmonary ROS, neg sleep apnea   Pulmonary exam normal breath sounds clear to auscultation       Cardiovascular hypertension, Pt. on medications + CAD  Normal cardiovascular exam Rhythm:Regular Rate:Normal  02/04/22 ECHO  1. Left ventricular ejection fraction, by estimation, is 60 to 65%. The  left ventricle has normal function. The left ventricle has no regional  wall motion abnormalities. There is mild concentric left ventricular  hypertrophy. Left ventricular diastolic parameters are consistent with  Grade I diastolic dysfunction (impaired relaxation). Elevated left  ventricular end-diastolic pressure.   2. Right ventricular systolic function is normal. The right ventricular  size is normal. There is normal pulmonary artery systolic pressure.   3. Left atrial size was mildly dilated.   4. The mitral valve is normal in structure. Trivial mitral valve  regurgitation. No evidence of mitral stenosis.   5. The aortic valve is tricuspid. Aortic valve regurgitation is not  visualized. No aortic stenosis is present.   6. The inferior vena cava is normal in size with greater than 50%  respiratory variability, suggesting right atrial pressure of 3 mmHg.     Neuro/Psych  Headaches  negative psych ROS   GI/Hepatic Neg liver ROS, hiatal hernia,GERD  Medicated,,  Endo/Other  hyperlipidemia  Renal/GU negative Renal ROS     Musculoskeletal negative musculoskeletal ROS (+)    Abdominal   Peds negative pediatric ROS (+)  Hematology negative hematology ROS (+)   Anesthesia Other Findings Sjogren's   Reproductive/Obstetrics                              Anesthesia Physical Anesthesia Plan  ASA: 3  Anesthesia Plan: MAC   Post-op Pain Management: Minimal or no pain anticipated   Induction: Intravenous  PONV Risk Score and Plan: 2 and Propofol  infusion, TIVA and Treatment may vary due to age or medical condition  Airway Management Planned:   Additional Equipment: None  Intra-op Plan:   Post-operative Plan:   Informed Consent: I have reviewed the patients History and Physical, chart, labs and discussed the procedure including the risks, benefits and alternatives for the proposed anesthesia with the patient or authorized representative who has indicated his/her understanding and acceptance.     Dental advisory given  Plan Discussed with: CRNA  Anesthesia Plan Comments: (Pt with hypertensive emergency with systolic 255-264. Recommend D/C to ED for BP )         Anesthesia Quick Evaluation

## 2024-08-06 ENCOUNTER — Encounter (HOSPITAL_COMMUNITY)
Admission: RE | Disposition: A | Discharge status: Home / Self Care | Payer: Self-pay | Source: Home / Self Care | Attending: Gastroenterology

## 2024-08-06 ENCOUNTER — Encounter (HOSPITAL_COMMUNITY): Payer: Self-pay | Admitting: Certified Registered Nurse Anesthetist

## 2024-08-06 ENCOUNTER — Other Ambulatory Visit: Payer: Self-pay

## 2024-08-06 ENCOUNTER — Ambulatory Visit (HOSPITAL_COMMUNITY)
Admission: RE | Admit: 2024-08-06 | Discharge: 2024-08-06 | Disposition: A | Discharge status: Home / Self Care | Attending: Gastroenterology | Admitting: Gastroenterology

## 2024-08-06 ENCOUNTER — Ambulatory Visit (HOSPITAL_COMMUNITY): Payer: Self-pay | Admitting: Certified Registered Nurse Anesthetist

## 2024-08-06 ENCOUNTER — Encounter (HOSPITAL_COMMUNITY): Payer: Self-pay | Admitting: Gastroenterology

## 2024-08-06 DIAGNOSIS — E785 Hyperlipidemia, unspecified: Secondary | ICD-10-CM | POA: Insufficient documentation

## 2024-08-06 DIAGNOSIS — I10 Essential (primary) hypertension: Secondary | ICD-10-CM | POA: Insufficient documentation

## 2024-08-06 DIAGNOSIS — Z538 Procedure and treatment not carried out for other reasons: Secondary | ICD-10-CM | POA: Insufficient documentation

## 2024-08-06 DIAGNOSIS — K449 Diaphragmatic hernia without obstruction or gangrene: Secondary | ICD-10-CM | POA: Diagnosis not present

## 2024-08-06 DIAGNOSIS — G4730 Sleep apnea, unspecified: Secondary | ICD-10-CM | POA: Insufficient documentation

## 2024-08-06 DIAGNOSIS — K219 Gastro-esophageal reflux disease without esophagitis: Secondary | ICD-10-CM | POA: Diagnosis not present

## 2024-08-06 SURGERY — CANCELLED PROCEDURE
Anesthesia: Monitor Anesthesia Care

## 2024-08-06 NOTE — Progress Notes (Signed)
 Pt in pre-op with hypertension. 260's systolic over 90's diastolic. Multiple rechecks and manual BP performed with same result. MD Benedetto and MD Nandigam aware. Procedure for today cancelled. MD Nandgiam advised pt to go to ER or be seen by PCP today. Pt denies wanting to go to ER.

## 2024-08-06 NOTE — Progress Notes (Signed)
 Severe hypertension with systolic 260 on repeat checks it did not come below 230s systolic.  Hypertensive emergency, advised patient to go to ER.  Explained potential risk for stroke and complications.  Patient declined to go to ER.  Advised her to follow up with her cardiologist and PCP soon to discuss medication adjustment for better management of labile hypertension

## 2024-09-03 ENCOUNTER — Encounter: Payer: Self-pay | Admitting: Neurology

## 2024-09-03 ENCOUNTER — Ambulatory Visit: Admitting: Neurology

## 2024-09-03 VITALS — BP 211/81 | HR 100 | Resp 20 | Ht 62.0 in | Wt 168.0 lb

## 2024-09-03 DIAGNOSIS — R2681 Unsteadiness on feet: Secondary | ICD-10-CM

## 2024-09-03 NOTE — Patient Instructions (Signed)
 Start physical therapy for gait training   We will send you to an academic center for second opinion

## 2024-09-03 NOTE — Progress Notes (Signed)
 Follow-up Visit   Date: 09/03/2024    Regina Marsh MRN: [id number] DOB: Jun 10, 1946    Regina Marsh is a 78 y.o. right-handed Caucasian female with refractory hypertension, Sjogren'sdisease, hypothyroidism, hypertension, and GERD, intracranial stenosis  returning to the clinic for follow-up of bilateral leg weakness.  The patient was accompanied to the clinic by husband who also provides collateral information.    IMPRESSION/PLAN: Assessment & Plan Episodic bilateral leg weakness and gait disturbance and imbalance.  Episodes every three months with leg buckling and ?freezing gait vs gait apraxia. Exam shows brisk reflexes in the legs, however there is no structural disease on spine imaging.  Gait today is normal, including tandem gait. She does not have any focal weakness, bradykinesia, or rigidity. She had underwent imaging of the neuroaxis which does not reveal any cause for her gait ataxia.    - Referred to physical therapy for symptom management. - Referred to [facility] for second opinion.   --------------------------------------------- History of present illness: On 08/16/2021, she went to the ER because of having difficulty walking, described as leg weakness and sensation that her legs would buckle. She did not fall, but felt like her legs were jello. She ambulated with her husband's support.  There was no leg pain, numbness, or tingling.  At the ER, she underwent CTA head and neck which showed R M1 occlusion with reconstitution distally, moderate bilateral vertebral stenosis, worseon the left, and left A1 stenosis. No significant stenosis of the carotid arteries (LICA < 50%).  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 She was evaluated by neurohospitalist who recommended she start aspirin  81mg  daily and follow-up with neuropathy. Of note, her orthostatic blood pressure was positive in the ER (230/81 supine, 208/95 sitting, 199/92 standing). Her BP has been very labile over the  past year. Today, it is very elevated 210/104.  She denies SOB or chest pain.     UPDATE 08/15/2023:  She is here for with two episodes of leg buckling.  In May it lasted 5-hours and in October, it lasted a few days.  She reports waking up and is able to stand up on the side of the bed, but she is unable to take a step because she feels as if her legs will buckle and she may fall. She was able to walk with help with with a cane.  She noticed that the same symptoms would occur if she say down again.  She contacted Dr. Hugo office because she had cervical surgery in January 2023.  Repeat MRI cervical spine did not show any explanation for her symptoms.  She was referred to to PT and was doing well, but another episode in October which lasted two days.  She is doing well now.  She has some low back pain and leg cramps.    UPDATE 09/03/2024:   Discussed the use of AI scribe software for clinical note transcription with the patient, who gave verbal consent to proceed.  History of Present Illness She experiences episodes of balance issues and leg weakness, occurring on February 14-18, May 19-21, and August 4-7. During these episodes, she has difficulty walking, with her legs buckling or moving sideways. The August episode was different, with her foot moving to the side instead of forward. She uses a cane during these episodes, and the symptoms persist even after sitting down and attempting to walk again. No falls have occurred, but she is cautious to avoid them.  She has a history of back  pain associated with these episodes and ongoing neck pain following a previous pinched nerve. The neck pain radiates to her shoulder, back, hip, and leg. MRI cervical spine did canal stenosis at C5-6 without cord impingement.    She has been working on balance exercises and used to walk four miles daily but has stopped using the treadmill due to feeling unsafe from the vibration.   Medications:  Current Outpatient  Medications on File Prior to Visit  Medication Sig Dispense Refill   aspirin  EC 81 MG tablet Take 81 mg by mouth daily. Swallow whole.     Bismuth Subsalicylate (PEPTO BISMOL PO) Take by mouth.     Cholecalciferol (VITAMIN D3) 50 MCG (2000 UT) capsule Take 4,000 Units by mouth daily. Take 2 tablets daily     diphenhydrAMINE (BENADRYL) 25 MG tablet Take 0.5 tablets by mouth daily as needed (allergies).     famotidine  (PEPCID ) 20 MG tablet Take 1 tablet (20 mg total) by mouth 2 (two) times daily. 60 tablet 5   MAGNESIUM GLYCINATE PO Take 400 mg by mouth daily.     Menatetrenone (VITAMIN K2) 100 MCG TABS Take 200 mcg by mouth daily.     Polyethyl Glycol-Propyl Glycol (SYSTANE) 0.4-0.3 % GEL ophthalmic gel Place 1 Application into both eyes. Once daily at bedtime.     Probiotic Product (PROBIOTIC BLEND PO) Take 1 tablet by mouth daily.     Propylene Glycol (SYSTANE BALANCE OP) Place 1 drop into both eyes 3 (three) times daily as needed (dry eyes).     PSYLLIUM PO Take 1 capsule by mouth daily at 12 noon.     Simethicone (GAS-X PO) Take 1 tablet by mouth daily as needed (flatulance).     TART CHERRY PO Take 1,200 mg by mouth at bedtime. Take two tablets at bedtime.     Turmeric Curcumin 500 MG CAPS Take 500 mg by mouth daily.     Ubiquinol 100 MG CAPS Take 1 capsule by mouth in the morning and at bedtime.     VITAMIN C, CALCIUM ASCORBATE, PO Take 350 mg by mouth daily.     acetaminophen  (TYLENOL ) 325 MG tablet Take 650 mg by mouth as needed for moderate pain, mild pain or headache.     D-MANNOSE PO Take 50 mg by mouth daily.     doxazosin  (CARDURA ) 1 MG tablet Take 3 tablets (3 mg total) by mouth at bedtime. 270 tablet 3   L-THEANINE PO Take 1 tablet by mouth daily as needed (pain).     Lactase (LACTAID PO) Take 1-3 tablets by mouth 3 (three) times daily as needed (when consuming milk products).     No current facility-administered medications on file prior to visit.    Allergies:  Allergies   Allergen Reactions   Valsartan Swelling and Cough    Myalgias,  tongue swelling   Aleve [Naproxen] Other (See Comments)    Causes severe bruising   Allegra [Fexofenadine] Other (See Comments)    Abdominal pain   Amlodipine  Other (See Comments)    lightheadedness   Cetirizine Hcl Other (See Comments)    Made her hyper - caused insomnia   Chlorthalidone  Other (See Comments)    lightheadedness   Hydralazine  Nausea And Vomiting    Leg pain, joint pain   Lactose Intolerance (Gi) Diarrhea   Lisinopril  Swelling    Caused eyes to swell   Loratadine Other (See Comments)    Made her hyper - caused insomnia   Pravastatin Other (  See Comments)    Leg pain, loss of memory   Simvastatin Other (See Comments)    Leg pain, loss of memory   Sulfa Antibiotics Nausea And Vomiting   African Mango [Irvingia Gabonensis] Swelling, Rash and Other (See Comments)    Blisters in mouth and tongue swelling   Penicillins Swelling and Rash    Tongue swelling   Spironolactone  Rash    Vital Signs:  BP (!) 211/81   Pulse 100   Resp 20   Ht 5' 2 (1.575 m)   Wt 168 lb (76.2 kg)   SpO2 97%   BMI 30.73 kg/m    Neurological Exam: MENTAL STATUS including orientation to time, place, person, recent and remote memory, attention span and concentration, language, and fund of knowledge is normal.  Speech is not dysarthric.  CRANIAL NERVES:  No visual field defects.  Pupils equal round and reactive to light.  Normal conjugate, extra-ocular eye movements in all directions of gaze.  Mild left ptosis.  Face is symmetric. Palate elevates symmetrically.  Tongue is midline.  MOTOR:  Motor strength is 5/5 in all extremities.  No atrophy, fasciculations or abnormal movements.  No pronator drift.  Tone is normal.    MSRs:  Reflexes are 2+/4, except 3+/4 bilateral patella.  Plantars are downgoing. .  SENSORY:  Intact to vibration and temperature.  COORDINATION/GAIT:  Normal finger-to- nose-finger.  Intact rapid  alternating movements bilaterally.  Gait narrow based and stable. Stressed and tandem gait intact.   Data:  MRI brain 04/02/2024:  1. No acute intracranial abnormality. 2. Unchanged dural-based lesions along both convexities, most consistent with meningiomas. 3. Small area of encephalomalacia in the medial right occipital lobe.  MRA neck 04/02/2024: Atherosclerotic disease at the proximal left internal carotid artery resulting in moderate (50%) stenosis.   CTA head and neck wwo contrast 08/16/2021: There is no acute intracranial hemorrhage or evidence of acute infarction. ASPECT score is 10.   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   Intracranial atherosclerosis. Moderate stenosis right vertebral artery and moderate to marked stenosis left vertebral artery just beyond PICA origin. There is stenosis at the left PICA origin. The posterior cerebral arteries are poorly visualized and likely diffusely irregular and stenotic. Probable early left MCA bifurcation with superimposed stenosis. Stenosis of proximal left A1 ACA.   No hemodynamically significant stenosis in the neck. Noncalcified plaque at the left ICA origin with less than 50% stenosis.   MRI brain wwo contrast 08/16/2021:  No acute infarction. Two small meningiomas.  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 AP diameter of the canal in the midline is 8.4 mm in there is no cord compression. There is moderate foraminal narrowing at this level which could possibly have some compressive effect upon either C6 nerve.   MRI cervical spine 04/06/2023: 1. Anterior cervical fusion at C5-6 without failure or complication. No foraminal or central canal stenosis. Mild bilateral facet arthropathy. 2. At C2-3 there is a tiny central disc protrusion. Mild right foraminal stenosis. 3. At C3-4 there is  moderate left and mild right facet arthropathy. Moderate left foraminal stenosis. 4. At C4-5 there is a mild broad-based disc bulge. Mild bilateral facet arthropathy. 5. At C6-7 there is a mild broad-based disc bulge. Moderate right facet arthropathy. 6. No acute osseous injury of the cervical spine.     Thank you for allowing me to participate in patient's care.  If I can answer any additional questions, I would be pleased to do so.    Sincerely,    Cira Deyoe K. Tobie, DO

## 2024-09-12 ENCOUNTER — Other Ambulatory Visit: Payer: Self-pay

## 2024-09-12 DIAGNOSIS — R2681 Unsteadiness on feet: Secondary | ICD-10-CM

## 2024-09-24 NOTE — Progress Notes (Unsigned)
 [facility] [address] [place] [address] Phone: [phone] Subjective:   ISusannah Marsh, am serving as a scribe for Dr. Arthea Claudene.  I'm seeing this patient by the request  of:  Chrystal Lamarr RAMAN, MD  CC: Multiple complaints still.  Walking being the most difficult.  YEP:Dlagzrupcz  06/26/2024 Left shoulder blade measures 10 x 6 cm.  Relatively large.  On ultrasound no type of communication with the underlying musculature.  Patient would like to continue to monitor it.  Can repeat ultrasound in the next 6 months to further evaluate if any changing in size.  Depending on findings we will discussed otherwise.  Follow-up again 6 to 8 weeks.  Worsening symptoms would need to be referred to general surgery      Encouraged continued supplementation.      DDD noted.  Discussed icing regimen and home exercises, discussed which activities to do and which ones to avoid.  Increase activity slowly.  Will continue to monitor.  No change in management at this time      Update 09/25/2024 Regina Marsh is a 78 y.o. female coming in with complaint of L shoulder discomfort. Talk to you about Dr. Tobie and her opinion. Thinking PT having trouble finding. Still having walking issues.    Patient did have a recent admission for unfortunately high blood pressure. In addition to this has seen neurology in referred to [facility].  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    REVERSE SHOULDER ARTHROPLASTY Right 02/09/2022   Procedure: REVERSE SHOULDER ARTHROPLASTY;  Surgeon: Cristy Bonner DASEN, MD;  Location: [facility];  Service: Orthopedics;  Laterality: Right;   SALIVARY GLAND SURGERY Left 1968   TONSILLECTOMY AND ADENOIDECTOMY  1951   Social History   Socioeconomic History   Marital status: Married    Spouse name: Radio Producer   Number of children: 1   Years of education: Not on file   Highest education level: Not on file  Occupational History   Occupation: retired Runner, Broadcasting/film/video  Tobacco Use   Smoking status: Never   Smokeless tobacco: Never  Vaping Use   Vaping status: Never Used  Substance and Sexual Activity   Alcohol use: Not Currently   Drug use: Never   Sexual activity: Not Currently    Birth control/protection: Post-menopausal  Other Topics Concern   Not on file  Social History Narrative   Right handed    Lives in a two story home    Social Drivers of Health   Tobacco Use: Low Risk (09/03/2024)   Patient History    Smoking Tobacco Use: Never    Smokeless Tobacco Use: Never    Passive Exposure: Not on file  Financial Resource Strain: Not on file  Food Insecurity: Not on file  Transportation Needs: Not on file  Physical Activity: Not on file  Stress: Not on file  Social Connections: Not on file  Depression (PHQ2-9): Not on file  Alcohol Screen: Not on file  Housing: Not on file  Utilities: Not on file  Health Literacy: Not on file   Allergies[1] Family History  Problem Relation Age of Onset   Hypertension Mother    Heart attack Mother    Stroke Father    Leukemia Father    Allergic rhinitis Neg Hx    Angioedema Neg Hx    Asthma Neg Hx    Atopy Neg Hx    Eczema Neg Hx    Immunodeficiency Neg Hx    Urticaria Neg Hx    Colon cancer Neg Hx    Esophageal cancer Neg Hx    Rectal cancer Neg Hx    Stomach cancer Neg Hx     Current Outpatient Medications (Cardiovascular):    doxazosin  (CARDURA ) 1 MG tablet, Take 3 tablets (3 mg total) by  mouth at bedtime.  Current Outpatient Medications (Respiratory):    diphenhydrAMINE (BENADRYL) 25 MG tablet, Take 0.5 tablets by mouth daily as needed (allergies).  Current Outpatient Medications (Analgesics):    acetaminophen  (TYLENOL ) 325 MG tablet, Take 650 mg by mouth as needed for moderate pain, mild pain or headache.   aspirin  EC 81 MG tablet, Take 81 mg by mouth daily. Swallow whole.  Current Outpatient Medications (Other):    Bismuth Subsalicylate (PEPTO BISMOL PO), Take by mouth.   Cholecalciferol (VITAMIN D3) 50 MCG (2000 UT) capsule, Take 4,000 Units by mouth daily. Take 2 tablets daily   D-MANNOSE PO, Take 50 mg by mouth daily.   famotidine  (PEPCID ) 20 MG tablet, Take 1 tablet (20 mg total) by mouth 2 (two) times daily.   L-THEANINE PO, Take 1 tablet by mouth daily as needed (pain).   Lactase (LACTAID PO), Take 1-3 tablets by mouth 3 (three) times daily as needed (when consuming milk products).   MAGNESIUM GLYCINATE PO, Take 400 mg by mouth daily.   Menatetrenone (VITAMIN K2) 100 MCG TABS, Take 200 mcg by mouth daily.   Polyethyl Glycol-Propyl Glycol (SYSTANE) 0.4-0.3 % GEL ophthalmic gel, Place 1 Application into both eyes. Once daily at bedtime.   Probiotic Product (PROBIOTIC BLEND PO), Take 1 tablet by mouth daily.   Propylene Glycol (SYSTANE BALANCE OP), Place 1 drop into both eyes 3 (three) times daily as needed (dry eyes).   PSYLLIUM PO, Take 1 capsule by mouth daily at 12 noon.   Simethicone (GAS-X PO), Take 1 tablet by mouth daily as needed (flatulance).   TART CHERRY PO, Take 1,200 mg by mouth at bedtime. Take two tablets at bedtime.   Turmeric Curcumin 500 MG CAPS, Take 500 mg by mouth daily.   Ubiquinol 100 MG CAPS, Take 1 capsule by mouth in the morning and at bedtime.   VITAMIN C, CALCIUM ASCORBATE, PO, Take 350 mg by mouth daily.   Reviewed prior external information including notes and imaging from  primary care provider As well as notes that were available  from care everywhere and other healthcare systems.  Past medical history, social, surgical and family history all reviewed in electronic medical record.  No pertanent information unless stated regarding to the chief complaint.   Review of Systems:  No headache, visual changes, nausea, vomiting, diarrhea, constipation, dizziness, abdominal pain, skin rash, fevers, chills, night sweats, weight loss, swollen lymph nodes, body aches, joint swelling, chest pain, shortness of breath, mood changes. POSITIVE muscle aches  Objective  Blood pressure ROLLEN)  142/90, pulse 74, height 5' 2 (1.575 m), weight 168 lb (76.2 kg), SpO2 98%.   General: No apparent distress alert and oriented x3 mood and affect normal, dressed appropriately.  HEENT: Pupils equal, extraocular movements intact  Respiratory: Patient's speak in full sentences and does not appear short of breath  Cardiovascular: No lower extremity edema, non tender, no erythema  Back exam does have some loss of lordosis noted.  Some tenderness to palpation in the paraspinal musculature. Patient able to get up from a seated position.  Mild broad-based gait noted. \   Impression and Recommendations:    The above documentation has been reviewed and is accurate and complete Arthea CHRISTELLA Sharps, DO        [1]  Allergies Allergen Reactions   Valsartan Swelling and Cough    Myalgias,  tongue swelling   Aleve [Naproxen] Other (See Comments)    Causes severe bruising   Allegra [Fexofenadine] Other (See Comments)    Abdominal pain   Amlodipine  Other (See Comments)    lightheadedness   Cetirizine Hcl Other (See Comments)    Made her hyper - caused insomnia   Chlorthalidone  Other (See Comments)    lightheadedness   Hydralazine  Nausea And Vomiting    Leg pain, joint pain   Lactose Intolerance (Gi) Diarrhea   Lisinopril  Swelling    Caused eyes to swell   Loratadine Other (See Comments)    Made her hyper - caused insomnia   Pravastatin Other (See  Comments)    Leg pain, loss of memory   Simvastatin Other (See Comments)    Leg pain, loss of memory   Sulfa Antibiotics Nausea And Vomiting   African Mango [Irvingia Gabonensis] Swelling, Rash and Other (See Comments)    Blisters in mouth and tongue swelling   Penicillins Swelling and Rash    Tongue swelling   Spironolactone  Rash

## 2024-09-25 ENCOUNTER — Ambulatory Visit: Admitting: Family Medicine

## 2024-09-25 VITALS — BP 142/90 | HR 74 | Ht 62.0 in | Wt 168.0 lb

## 2024-09-25 DIAGNOSIS — D171 Benign lipomatous neoplasm of skin and subcutaneous tissue of trunk: Secondary | ICD-10-CM | POA: Diagnosis not present

## 2024-09-25 DIAGNOSIS — R42 Dizziness and giddiness: Secondary | ICD-10-CM

## 2024-09-25 DIAGNOSIS — R2689 Other abnormalities of gait and mobility: Secondary | ICD-10-CM | POA: Diagnosis not present

## 2024-09-25 DIAGNOSIS — R279 Unspecified lack of coordination: Secondary | ICD-10-CM

## 2024-09-25 DIAGNOSIS — N2889 Other specified disorders of kidney and ureter: Secondary | ICD-10-CM | POA: Diagnosis not present

## 2024-09-25 NOTE — Patient Instructions (Addendum)
 [facility] [phone] Call Today  When we receive your results we will contact you.  PT with Elaine Patella strap for husband See you again in 2-3 months

## 2024-09-25 NOTE — Assessment & Plan Note (Signed)
 Seems a little better with patient having a little better with the hypertension.  No does feel that she is deconditioned fairly significantly.  Would like to start formal physical therapy for strength and conditioning.  Discussed with patient about icing regimen of home exercises, discussed which activities to do and which ones to avoid.  Increase activity slowly.  Discussed icing regimen.  Follow-up again in 6 to 12 weeks

## 2024-09-25 NOTE — Assessment & Plan Note (Signed)
 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  Continues to have labile hypertension to a certain degree that patient thinks is secondary to nightshades.  I think at this point though with patient still being symptomatic and having difficulty even with balance and sometimes other symptoms I do feel that further evaluation of the cyst is warranted at this time.  MRI with and without contrast ordered to see if we can further evaluate.  I personally spent a total of 35 minutes in the care of the patient today including preparing to see the patient, performing a medically appropriate exam/evaluation, counseling and educating, placing orders, referring and communicating with other health care professionals, documenting clinical information in the EHR, independently interpreting results, communicating results, and coordinating care.

## 2024-09-25 NOTE — Assessment & Plan Note (Signed)
 Discussed again at great length.  Discussed the possibility of removal.  Patient wants to hold at this time.

## 2024-09-30 ENCOUNTER — Other Ambulatory Visit: Payer: Self-pay

## 2024-09-30 ENCOUNTER — Ambulatory Visit
Admission: RE | Admit: 2024-09-30 | Discharge: 2024-09-30 | Disposition: A | Discharge status: Home / Self Care | Source: Ambulatory Visit | Attending: Family Medicine | Admitting: Family Medicine

## 2024-09-30 VITALS — BP 214/91 | HR 89 | Temp 97.6°F | Resp 16

## 2024-09-30 DIAGNOSIS — S40012A Contusion of left shoulder, initial encounter: Secondary | ICD-10-CM

## 2024-09-30 DIAGNOSIS — T50905A Adverse effect of unspecified drugs, medicaments and biological substances, initial encounter: Secondary | ICD-10-CM | POA: Diagnosis not present

## 2024-09-30 DIAGNOSIS — T50Z95A Adverse effect of other vaccines and biological substances, initial encounter: Secondary | ICD-10-CM

## 2024-09-30 MED ORDER — ONDANSETRON HCL 8 MG PO TABS: 8.0000 mg | ORAL_TABLET | Freq: Three times a day (TID) | ORAL | 0 refills | Status: AC | PRN

## 2024-09-30 MED ORDER — TRAMADOL HCL 50 MG PO TABS: 50.0000 mg | ORAL_TABLET | Freq: Three times a day (TID) | ORAL | 0 refills | Status: AC | PRN

## 2024-09-30 NOTE — ED Provider Notes (Signed)
 " [facility]    CSN: [id number] Arrival date & time: 09/30/24  9085      History   Chief Complaint Chief Complaint  Patient presents with   Allergic Reaction    HPI Regina Marsh is a 78 y.o. female.   Patient had a pneumonia shot for the first time 5 days ago.  Had it done at the pharmacy.  She states at the time of the injection it burned terribly.  Worse than injections usually do.  She gets flu shots every year without difficulty.  Ever since then she has had a big knot and bruise on her arm.  Her arm is painful.  She has also had a terrible headache every day since then.  She tried Advil for the headache but that is caused her to have nausea.  She states she feels slightly better today than yesterday, but is concerned she does not want to be sick for the holiday    Past Medical History:  Diagnosis Date   GERD (gastroesophageal reflux disease)    Headache    Hiatal hernia    Hyperlipidemia    Hypertension    Low back pain    Sjogren's disease     Patient Active Problem List   Diagnosis Date Noted   Renal mass 09/25/2024   Lipoma of back 06/26/2024   Flatulence 03/07/2024   Hiatal hernia 01/30/2024   Cervical disc disorder with radiculopathy of cervical region 12/12/2023   Medication intolerance 03/30/2022   Coronary artery calcification seen on CT scan 03/30/2022   Vitamin D  deficiency 12/14/2021   Class 1 obesity due to excess calories without serious comorbidity with body mass index (BMI) of 30.0 to 30.9 in adult 12/14/2021   Sensation of fullness in right ear 10/12/2021   Mixed hyperlipidemia 03/09/2021   Chest pain of uncertain etiology 03/09/2021   Primary hypertension 01/07/2021   Rash and other nonspecific skin eruption 12/10/2020   Drug reaction 12/10/2020   Sjogren's syndrome 06/23/2020   Essential hypertension, benign 06/23/2020   Episodic lightheadedness 06/23/2020    Past Surgical History:  Procedure Laterality Date   BREAST  LUMPECTOMY Bilateral    left-1966, right-2001   CERVICAL SPINE SURGERY  11/03/2021   PLATE AND SCREWS, Dr Mavis   CESAREAN SECTION  1984   detached eye lid Left 2011   LAPAROSCOPY     x 2, for adhesions, 1989 and 1990   NASAL SINUS SURGERY  2006   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      Home Medications    Prior to Admission medications  Medication Sig Start Date End Date Taking? Authorizing Provider  ondansetron  (ZOFRAN ) 8 MG tablet Take 1 tablet (8 mg total) by mouth every 8 (eight) hours as needed for nausea or vomiting. 09/30/24  Yes Maranda Jamee Jacob, MD  traMADol  (ULTRAM ) 50 MG tablet Take 1-2 tablets (50-100 mg total) by mouth 3 (three) times daily as needed. 09/30/24  Yes Maranda Jamee Jacob, MD  acetaminophen  (TYLENOL ) 325 MG tablet Take 650 mg by mouth as needed for moderate pain, mild pain or headache.    [provider]  aspirin  EC 81 MG tablet  Take 81 mg by mouth daily. Swallow whole.    [provider]  Bismuth Subsalicylate (PEPTO BISMOL PO) Take by mouth.    [provider]  Cholecalciferol (VITAMIN D3) 50 MCG (2000 UT) capsule Take 4,000 Units by mouth daily. Take 2 tablets daily    [provider]  D-MANNOSE PO Take 50 mg by mouth daily.    [provider]  diphenhydrAMINE (BENADRYL) 25 MG tablet Take 0.5 tablets by mouth daily as needed (allergies).    [provider]  doxazosin  (CARDURA ) 1 MG tablet Take 3 tablets (3 mg total) by mouth at bedtime. 01/29/24   Vannie Reche RAMAN, NP  famotidine  (PEPCID ) 20 MG tablet Take 1 tablet (20 mg total) by mouth 2 (two) times daily. 01/07/21   Luke Orlan HERO, DO  L-THEANINE PO Take 1  tablet by mouth daily as needed (pain).    [provider]  Lactase (LACTAID PO) Take 1-3 tablets by mouth 3 (three) times daily as needed (when consuming milk products).    [provider]  MAGNESIUM GLYCINATE PO Take 400 mg by mouth daily.    [provider]  Menatetrenone (VITAMIN K2) 100 MCG TABS Take 200 mcg by mouth daily.    [provider]  Polyethyl Glycol-Propyl Glycol (SYSTANE) 0.4-0.3 % GEL ophthalmic gel Place 1 Application into both eyes. Once daily at bedtime.    [provider]  Probiotic Product (PROBIOTIC BLEND PO) Take 1 tablet by mouth daily.    [provider]  Propylene Glycol (SYSTANE BALANCE OP) Place 1 drop into both eyes 3 (three) times daily as needed (dry eyes).    [provider]  PSYLLIUM PO Take 1 capsule by mouth daily at 12 noon.    [provider]  Simethicone (GAS-X PO) Take 1 tablet by mouth daily as needed (flatulance).    [provider]  TART CHERRY PO Take 1,200 mg by mouth at bedtime. Take two tablets at bedtime.    [provider]  Turmeric Curcumin 500 MG CAPS Take 500 mg by mouth daily.    [provider]  Ubiquinol 100 MG CAPS Take 1 capsule by mouth in the morning and at bedtime.    [provider]  VITAMIN C, CALCIUM ASCORBATE, PO Take 350 mg by mouth daily.    [provider]    Family History Family History  Problem Relation Age of Onset   Hypertension Mother    Heart attack Mother    Stroke Father    Leukemia Father    Allergic rhinitis Neg Hx    Angioedema Neg Hx    Asthma Neg Hx    Atopy Neg Hx    Eczema Neg Hx    Immunodeficiency Neg Hx    Urticaria Neg Hx    Colon cancer Neg Hx    Esophageal cancer Neg Hx    Rectal cancer Neg Hx    Stomach cancer Neg Hx     Social History Social History[1]   Allergies   Valsartan, Aleve [naproxen], Allegra [fexofenadine], Amlodipine , Cetirizine hcl, Chlorthalidone ,  Hydralazine , Lactose intolerance (gi), Lisinopril , Loratadine, Pravastatin, Simvastatin, Sulfa antibiotics, Other, African mango [irvingia gabonensis], Penicillins, and Spironolactone    Review of Systems Review of Systems See HPI  Physical Exam Triage Vital Signs ED Triage Vitals  Encounter Vitals Group     BP 09/30/24 0922 (!) 221/94     Girls Systolic BP Percentile --      Girls Diastolic BP Percentile --  Boys Systolic BP Percentile --      Boys Diastolic BP Percentile --      Pulse Rate 09/30/24 0922 89     Resp 09/30/24 0922 16     Temp 09/30/24 0922 97.6 F (36.4 C)     Temp src --      SpO2 09/30/24 0922 96 %     Weight --      Height --      Head Circumference --      Peak Flow --      Pain Score 09/30/24 0926 4     Pain Loc --      Pain Education --      Exclude from Growth Chart --    No data found.  Updated Vital Signs BP (!) 214/91   Pulse 89   Temp 97.6 F (36.4 C)   Resp 16   SpO2 96%       Physical Exam Constitutional:      General: She is not in acute distress.    Appearance: She is well-developed. She is ill-appearing.     Comments: Appears uncomfortable.  Appears tired.  HENT:     Head: Normocephalic and atraumatic.  Eyes:     Conjunctiva/sclera: Conjunctivae normal.     Pupils: Pupils are equal, round, and reactive to light.  Cardiovascular:     Rate and Rhythm: Normal rate.  Pulmonary:     Effort: Pulmonary effort is normal. No respiratory distress.  Musculoskeletal:        General: Normal range of motion.     Cervical back: Normal range of motion.  Skin:    General: Skin is warm and dry.     Findings: Bruising present.     Comments: Left deltoid has a large resolving bruise that measures 4 x 8 cm.  There is underlying induration.  Area is quite tender.  Neurological:     Mental Status: She is alert.      UC Treatments / Results  Labs (all labs ordered are listed, but only abnormal results are displayed) Labs Reviewed -  No data to display  EKG   Radiology No results found.  Procedures Procedures (including critical care time)  Medications Ordered in UC Medications - No data to display  Initial Impression / Assessment and Plan / UC Course  I have reviewed the triage vital signs and the nursing notes.  Pertinent labs & imaging results that were available during my care of the patient were reviewed by me and considered in my medical decision making (see chart for details).     Discussed shot reaction. Final Clinical Impressions(s) / UC Diagnoses   Final diagnoses:  Reaction to shot, initial encounter     Discharge Instructions      I have prescribed a stronger painkiller to take for your headache and arm pain.  It is called tramadol .  It might cause drowsiness. I have also prescribed some Zofran .  This is a good medicine for nausea. Expect improvement over next couple days   ED Prescriptions     Medication Sig Dispense Auth. Provider   ondansetron  (ZOFRAN ) 8 MG tablet Take 1 tablet (8 mg total) by mouth every 8 (eight) hours as needed for nausea or vomiting. 20 tablet Maranda Jamee Jacob, MD   traMADol  (ULTRAM ) 50 MG tablet Take 1-2 tablets (50-100 mg total) by mouth 3 (three) times daily as needed. 15 tablet Maranda Jamee Jacob, MD      I have  reviewed the PDMP during this encounter.    [1]  Social History Tobacco Use   Smoking status: Never   Smokeless tobacco: Never  Vaping Use   Vaping status: Never Used  Substance Use Topics   Alcohol use: Not Currently   Drug use: Never     Maranda Jamee Jacob, MD 09/30/24 [phone]  "

## 2024-09-30 NOTE — ED Triage Notes (Signed)
 Has c/o swelling and bruising to left arm after pna shot 5 days ago. Also has c/o ha and nausea. Has had tylenol  and advil, benadryl.

## 2024-09-30 NOTE — Discharge Instructions (Signed)
 I have prescribed a stronger painkiller to take for your headache and arm pain.  It is called tramadol .  It might cause drowsiness. I have also prescribed some Zofran .  This is a good medicine for nausea. Expect improvement over next couple days

## 2024-10-07 ENCOUNTER — Ambulatory Visit: Admitting: Physical Therapy

## 2024-10-07 ENCOUNTER — Encounter: Payer: Self-pay | Admitting: Physical Therapy

## 2024-10-07 ENCOUNTER — Other Ambulatory Visit: Payer: Self-pay

## 2024-10-07 DIAGNOSIS — R2689 Other abnormalities of gait and mobility: Secondary | ICD-10-CM | POA: Diagnosis not present

## 2024-10-07 DIAGNOSIS — M6281 Muscle weakness (generalized): Secondary | ICD-10-CM

## 2024-10-07 DIAGNOSIS — M542 Cervicalgia: Secondary | ICD-10-CM

## 2024-10-07 DIAGNOSIS — M5459 Other low back pain: Secondary | ICD-10-CM | POA: Diagnosis not present

## 2024-10-07 NOTE — Patient Instructions (Signed)
 Access Code: [email or web address] URL: [email or web address] Date: 10/07/2024 Prepared by: Elaine Daring  Exercises - Active Straight Leg Raise with Quad Set  - 1 x daily - 2 sets - 10 reps - Bridge  - 1 x daily - 3 sets - 5 reps - 5 seconds hold - Clam  - 1 x daily - 3 sets - 10 reps - Sit to Stand Without Arm Support  - 1 x daily - 3 sets - 10 reps - Standing Tandem Balance with Counter Support  - 1 x daily - 3 reps - 30 seconds hold - Standing Row with Anchored Resistance  - 1 x daily - 3 sets - 10 reps

## 2024-10-07 NOTE — Therapy (Signed)
 " OUTPATIENT PHYSICAL THERAPY EVALUATION   Patient Name: Regina Marsh MRN: [id number] DOB:Sep 22, 1946, 78 y.o., female Today's Date: 10/07/2024   END OF SESSION:  PT End of Session - 10/07/24 1406     Visit Number 1    Number of Visits 16    Date for Recertification  12/02/24    Authorization Type BC[employer] MCR    PT Start Time 1350    PT Stop Time 1445    PT Time Calculation (min) 55 min    Activity Tolerance Patient tolerated treatment well    Behavior During Therapy [facility] for tasks assessed/performed          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   REVERSE SHOULDER ARTHROPLASTY Right 02/09/2022   Procedure: REVERSE SHOULDER ARTHROPLASTY;  Surgeon: Cristy Bonner DASEN, MD;  Location: [facility];  Service: Orthopedics;  Laterality: Right;   SALIVARY GLAND SURGERY Left 1968   TONSILLECTOMY AND ADENOIDECTOMY  1951   Patient Active Problem List   Diagnosis Date Noted   Renal mass 09/25/2024   Lipoma of back 06/26/2024   Flatulence 03/07/2024   Hiatal hernia 01/30/2024   Cervical disc disorder with radiculopathy of cervical region 12/12/2023   Medication intolerance 03/30/2022   Coronary artery calcification seen on CT scan 03/30/2022   Vitamin D  deficiency 12/14/2021   Class 1 obesity due to excess calories without serious comorbidity with body mass index (BMI) of 30.0 to 30.9 in adult 12/14/2021   Sensation of fullness in right ear 10/12/2021   Mixed hyperlipidemia 03/09/2021   Chest pain of  uncertain etiology 03/09/2021   Primary hypertension 01/07/2021   Rash and other nonspecific skin eruption 12/10/2020   Drug reaction 12/10/2020   Sjogren's syndrome 06/23/2020   Essential hypertension, benign 06/23/2020   Episodic lightheadedness 06/23/2020    PCP: Chrystal Lamarr RAMAN, MD   REFERRING PROVIDER: Claudene Arthea HERO, DO  REFERRING DIAG: Balance problem; Coordination problem  THERAPY DIAG:  Other abnormalities of gait and mobility  Muscle weakness (generalized)  Other low back pain  Cervicalgia  Rationale for Evaluation and Treatment: Rehabilitation  ONSET DATE: Chronic   SUBJECTIVE:  SUBJECTIVE STATEMENT: Patient reports she is having trouble walking. She states in November of 2022 she has surgery for pinched nerve in her neck and then 18 months later she started having episodes where she would get up and her legs would buckle or give out on her. Her most recent episode was back in August, but this was different because she wasn't having buckling but wasn't walking straight, but her gait has changed and her balance has become an issue. She doesn't feel as steady. She also gets back, neck, and legs pain when she is grocery shopping or laundry, mainly on the left side. She also notes that she gets leg pain that will wake her up at night and standing will  help alleviate the pain. She did see a neurologist and her right side is weaker, and she does get some numbness in her face from time to time, but denies any numbness in hands or feet. She also reports worsening of fine motor tasks.  PERTINENT HISTORY: See PMH above  PAIN:  Are you having pain? Yes:  NPRS scale: 0/10 currently, 7-8/10 at worst Pain location: Left back, neck, and leg Pain description: Burning Aggravating factors: Grocery shopping and laundry Relieving factors: Rest  PRECAUTIONS: None  RED FLAGS: None   WEIGHT BEARING RESTRICTIONS: No  FALLS:  Has patient fallen in last 6 months?  No  PLOF: Independent  PATIENT GOALS: Improve balance and walking   OBJECTIVE:  Note: Objective measures were completed at Evaluation unless otherwise noted. PATIENT SURVEYS:  PSFS: 4 (taking lowest number) Walking, no endurance, balance: 1-7 Laundry and grocery shopping, leg pain to neck: 4-8 Loss of strength of right side: 7  COGNITION: Overall cognitive status: Within functional limits for tasks assessed     SENSATION: WFL  POSTURE:   Rounded shoulder posture  LOWER EXTREMITY MMT:  MMT Right eval Left eval  Hip flexion 4- 4-  Hip extension 3+ 3+  Hip abduction 3+ 3+  Hip adduction    Hip internal rotation    Hip external rotation    Knee flexion 4 4  Knee extension 4 4+  Ankle dorsiflexion    Ankle plantarflexion    Ankle inversion    Ankle eversion     (Blank rows = not tested)  FUNCTIONAL TESTS:  30 seconds chair stand test: 16 seconds SLS: approximately 15 seconds each  GAIT: Distance walked: 180 ft Assistive device utilized: None Level of assistance: Complete Independence Comments: Trendelenburg, decreased arm swing/trunk rotation                                                                                                                               TREATMENT  [facility] Adult PT Treatment:                                                DATE: 10/07/2024 SLR x 10 each Bridge 5 x 5 sec Side clamshell x 10 each Sit to stand x 10 Tandem stance x 30 sec each Row with blue 2 x 10  Discussed her current activity level and initiating a walking program using her treadmill or outdoor walking. Discussed possible OT referral to improve fine motor tasks.  PATIENT EDUCATION:  Education details: Exam findings, POC, HEP Person educated: Patient Education method: Explanation, Demonstration, Tactile cues, Verbal cues, and Handouts Education comprehension: verbalized understanding, returned demonstration, verbal cues required, tactile cues required, and needs  further education  HOME EXERCISE PROGRAM: Access Code: [email or web address]    ASSESSMENT: CLINICAL IMPRESSION: Patient is a 78 y.o. female  who was seen today for physical therapy evaluation and treatment for gait and balance difficulties, and weakness. She does exhibit some right > left LE and hip weakness, balance deficits, and gait deviations. She doesn't report any specific neurological symptoms and does not demonstrate any ataxic movements. Unclear etiology of her LE buckling episodes but she has not had these in quite some time. Therapy will focus on progressing her strength, balance training, endurance, and gait to maximize her functional ability.   OBJECTIVE IMPAIRMENTS: Abnormal gait, decreased activity tolerance, decreased balance, decreased strength, postural dysfunction, and pain.   ACTIVITY LIMITATIONS: locomotion level  PARTICIPATION LIMITATIONS: cleaning, laundry, shopping, and community activity  PERSONAL FACTORS: Fitness, Past/current experiences, and Time since onset of injury/illness/exacerbation are also affecting patient's functional outcome.   REHAB POTENTIAL: Good  CLINICAL DECISION MAKING: Stable/uncomplicated  EVALUATION COMPLEXITY: Low   GOALS: Goals reviewed with patient? Yes  SHORT TERM GOALS: Target date: 11/04/2024  Patient will be I with initial HEP in order to progress with therapy. Baseline: HEP provided at eval Goal status: INITIAL  2.  Patient will report pain with shopping and laundry </= 5/10 in order to reduce functional limitations Baseline: 7-8/10 Goal status: INITIAL  LONG TERM GOALS: Target date: 12/02/2024  Patient will be I with final HEP to maintain progress from PT. Baseline: HEP provided at eval Goal status: INITIAL  2.  Patient will report PSFS >/= 7 in order to indicate improvement in their functional ability. Baseline: 4 Goal status: INITIAL  3.  Patient will demonstrate knee strength 5/5 MMT and hip strength >/= 4/5 MMT in order to  improve her walking tolerance and mobility Baseline: see limitations above Goal status: INITIAL  4.  Patient will demonstrate SLS >/= 30 seconds in order to improve balance and stability with walking and household tasks Baseline: 15 seconds Goal status: INITIAL   PLAN: PT FREQUENCY: 1-2x/week  PT DURATION: 8 weeks  PLANNED INTERVENTIONS: 97164- PT Re-evaluation, 97750- Physical Performance Testing, 97110-Therapeutic exercises, 97530- Therapeutic activity, 97112- Neuromuscular re-education, 97535- Self Care, [id number]- Manual therapy, [phone]- Gait training, Patient/Family education, Balance training, Stair training, Joint mobilization, Joint manipulation, Spinal manipulation, and Spinal mobilization  PLAN FOR NEXT SESSION: Review HEP and progress PRN, progress LE/hip/core/posture strengthening, balance training, gait training, progress to more upright and weight bearing exercises   Elaine Daring, PT, DPT, LAT, ATC 10/07/2024  3:10 PM Phone: [phone] Fax: [phone]   "

## 2024-10-11 ENCOUNTER — Encounter: Payer: Self-pay | Admitting: Physical Therapy

## 2024-10-11 ENCOUNTER — Ambulatory Visit: Admitting: Physical Therapy

## 2024-10-11 ENCOUNTER — Other Ambulatory Visit: Payer: Self-pay

## 2024-10-11 DIAGNOSIS — R2689 Other abnormalities of gait and mobility: Secondary | ICD-10-CM | POA: Diagnosis not present

## 2024-10-11 DIAGNOSIS — M6281 Muscle weakness (generalized): Secondary | ICD-10-CM

## 2024-10-11 DIAGNOSIS — M5459 Other low back pain: Secondary | ICD-10-CM

## 2024-10-11 DIAGNOSIS — M542 Cervicalgia: Secondary | ICD-10-CM

## 2024-10-11 NOTE — Therapy (Signed)
 " OUTPATIENT PHYSICAL THERAPY TREATMENT   Patient Name: Regina Marsh MRN: [id number] DOB:[birth year], 79 y.o., female Today's Date: 10/11/2024   END OF SESSION:  PT End of Session - 10/11/24 0808     Visit Number 2    Number of Visits 16    Date for Recertification  12/02/24    Authorization Type BCBS MCR    PT Start Time 0800    PT Stop Time 0845    PT Time Calculation (min) 45 min    Activity Tolerance Patient tolerated treatment well    Behavior During Therapy [facility] for tasks assessed/performed           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   REVERSE SHOULDER ARTHROPLASTY Right 02/09/2022   Procedure: REVERSE SHOULDER ARTHROPLASTY;  Surgeon: Cristy Bonner DASEN, MD;  Location: [facility];  Service: Orthopedics;  Laterality: Right;   SALIVARY GLAND SURGERY Left 1968   TONSILLECTOMY AND ADENOIDECTOMY  1951   Patient Active Problem List   Diagnosis Date Noted   Renal mass 09/25/2024   Lipoma of back 06/26/2024   Flatulence 03/07/2024   Hiatal hernia 01/30/2024   Cervical disc disorder with radiculopathy of cervical region 12/12/2023   Medication intolerance 03/30/2022   Coronary artery calcification seen on CT scan 03/30/2022   Vitamin D  deficiency 12/14/2021   Class 1 obesity due to excess calories without serious comorbidity with body mass index (BMI) of 30.0 to 30.9 in adult 12/14/2021   Sensation of fullness in right ear 10/12/2021   Mixed hyperlipidemia 03/09/2021   Chest pain of  uncertain etiology 03/09/2021   Primary hypertension 01/07/2021   Rash and other nonspecific skin eruption 12/10/2020   Drug reaction 12/10/2020   Sjogren's syndrome 06/23/2020   Essential hypertension, benign 06/23/2020   Episodic lightheadedness 06/23/2020    PCP: Chrystal Lamarr RAMAN, MD   REFERRING PROVIDER: Claudene Arthea HERO, DO  REFERRING DIAG: Balance problem; Coordination problem  THERAPY DIAG:  Other abnormalities of gait and mobility  Muscle weakness (generalized)  Other low back pain  Cervicalgia  Rationale for Evaluation and Treatment: Rehabilitation  ONSET DATE: Chronic   SUBJECTIVE:  SUBJECTIVE STATEMENT: Patient reports she tried walking on the treadmill and around the house for 5 minutes and her hips hurt, but then she did it the next day without shoes and she states her hips felt better. 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   Eval: Patient reports she is having trouble walking. She states in November of 2022 she has surgery for pinched nerve in her neck and then 18 months later she started having episodes where she would get up and her legs would buckle or give out on her. Her most recent episode was back in August, but this was different because she wasn't having buckling but  wasn't walking straight, but her gait has changed and her balance has become an issue. She doesn't feel as steady. She also gets back, neck, and legs pain when she is grocery shopping or laundry, mainly on the left side. She also notes that she gets leg pain that will wake her up at night and standing will help alleviate the pain. She did see a neurologist and her right side is weaker, and she does get some numbness in her face from time to time, but denies any numbness in hands or feet. She also reports worsening of fine motor tasks.  PERTINENT HISTORY: See PMH above  PAIN:  Are you having pain? Yes:  NPRS scale: 0/10 currently,  7-8/10 at worst Pain location: Left back, neck, and leg Pain description: Burning Aggravating factors: Grocery shopping and laundry Relieving factors: Rest  PRECAUTIONS: None  PATIENT GOALS: Improve balance and walking   OBJECTIVE:  Note: Objective measures were completed at Evaluation unless otherwise noted. PATIENT SURVEYS:  PSFS: 4 (taking lowest number) Walking, no endurance, balance: 1-7 Laundry and grocery shopping, leg pain to neck: 4-8 Loss of strength of right side: 7  POSTURE:   Rounded shoulder posture  LOWER EXTREMITY MMT:  MMT Right eval Left eval  Hip flexion 4- 4-  Hip extension 3+ 3+  Hip abduction 3+ 3+  Hip adduction    Hip internal rotation    Hip external rotation    Knee flexion 4 4  Knee extension 4 4+  Ankle dorsiflexion    Ankle plantarflexion    Ankle inversion    Ankle eversion     (Blank rows = not tested)  FUNCTIONAL TESTS:  30 seconds chair stand test: 16 seconds SLS: approximately 15 seconds each  GAIT: Distance walked: 180 ft Assistive device utilized: None Level of assistance: Complete Independence Comments: Trendelenburg, decreased arm swing/trunk rotation                                                                                                                               TREATMENT  [facility] Adult PT Treatment:                                                DATE: 10/11/2024 Recumbent bike L3 x 5 min to improve her endurance and workload capacity Side clamshell with yellow 3 x 10 each SLR x 15 each LAQ 3 x 10 each Sit to stand holding 5# at chest x 10, 8# at chest 2 x 10 Standing hip extension with yellow at knees 3 x 10 each Tandem stance 3 x 30 sec each Standing heel raises 3 x 10  PATIENT EDUCATION:  Education details: HEP update Person educated: Patient Education method: Explanation, Demonstration, Tactile cues, Verbal cues, and Handouts Education comprehension: verbalized understanding, returned demonstration,  verbal cues  required, tactile cues required, and needs further education  HOME EXERCISE PROGRAM: Access Code: [email or web address]    ASSESSMENT: CLINICAL IMPRESSION: Patient tolerated therapy well with no adverse effects. Therapy focused primarily on progressing LE strengthening and balance training. She was able to progress with her strengthening exercises, incorporating bands and increased weight. She does note lateral hip discomfort that is likely greater trochanteric related pain from hip weakness. She still does have difficulty with her tandem stance. Updated her HEP to progress strengthening for home. Patient would benefit from continued skilled PT to progress mobility and strength in order to reduce pain and maximize functional ability.   Eval: Patient is a 79 y.o. female who was seen today for physical therapy evaluation and treatment for gait and balance difficulties, and weakness. She does exhibit some right > left LE and hip weakness, balance deficits, and gait deviations. She doesn't report any specific neurological symptoms and does not demonstrate any ataxic movements. Unclear etiology of her LE buckling episodes but she has not had these in quite some time. Therapy will focus on progressing her strength, balance training, endurance, and gait to maximize her functional ability.   OBJECTIVE IMPAIRMENTS: Abnormal gait, decreased activity tolerance, decreased balance, decreased strength, postural dysfunction, and pain.   ACTIVITY LIMITATIONS: locomotion level  PARTICIPATION LIMITATIONS: cleaning, laundry, shopping, and community activity  PERSONAL FACTORS: Fitness, Past/current experiences, and Time since onset of injury/illness/exacerbation are also affecting patient's functional outcome.    GOALS: Goals reviewed with patient? Yes  SHORT TERM GOALS: Target date: 11/04/2024  Patient will be I with initial HEP in order to progress with therapy. Baseline: HEP provided at eval Goal status:  INITIAL  2.  Patient will report pain with shopping and laundry </= 5/10 in order to reduce functional limitations Baseline: 7-8/10 Goal status: INITIAL  LONG TERM GOALS: Target date: 12/02/2024  Patient will be I with final HEP to maintain progress from PT. Baseline: HEP provided at eval Goal status: INITIAL  2.  Patient will report PSFS >/= 7 in order to indicate improvement in their functional ability. Baseline: 4 Goal status: INITIAL  3.  Patient will demonstrate knee strength 5/5 MMT and hip strength >/= 4/5 MMT in order to improve her walking tolerance and mobility Baseline: see limitations above Goal status: INITIAL  4.  Patient will demonstrate SLS >/= 30 seconds in order to improve balance and stability with walking and household tasks Baseline: 15 seconds Goal status: INITIAL   PLAN: PT FREQUENCY: 1-2x/week  PT DURATION: 8 weeks  PLANNED INTERVENTIONS: 97164- PT Re-evaluation, 97750- Physical Performance Testing, 97110-Therapeutic exercises, 97530- Therapeutic activity, 97112- Neuromuscular re-education, 97535- Self Care, [id number]- Manual therapy, [phone]- Gait training, Patient/Family education, Balance training, Stair training, Joint mobilization, Joint manipulation, Spinal manipulation, and Spinal mobilization  PLAN FOR NEXT SESSION: Review HEP and progress PRN, progress LE/hip/core/posture strengthening, balance training, gait training, progress to more upright and weight bearing exercises   Elaine Daring, PT, DPT, LAT, ATC 10/11/2024  8:50 AM Phone: [phone] Fax: [phone]   "

## 2024-10-11 NOTE — Patient Instructions (Signed)
 Access Code: [email or web address] URL: [email or web address] Date: 10/11/2024 Prepared by: Elaine Daring  Exercises - Active Straight Leg Raise with Quad Set  - 1 x daily - 2 sets - 15 reps - Clam with Resistance  - 1 x daily - 3 sets - 10 reps - Sit to Stand Without Arm Support  - 1 x daily - 3 sets - 10 reps - Standing Hip Extension with Counter Support  - 1 x daily - 3 sets - 10 reps - Heel Raises with Counter Support  - 1 x daily - 3 sets - 10 reps - Standing Tandem Balance with Counter Support  - 1 x daily - 3 reps - 30 seconds hold - Standing Row with Anchored Resistance  - 1 x daily - 3 sets - 10 reps

## 2024-10-15 ENCOUNTER — Ambulatory Visit
Admission: RE | Admit: 2024-10-15 | Discharge: 2024-10-15 | Disposition: A | Source: Ambulatory Visit | Attending: Family Medicine | Admitting: Family Medicine

## 2024-10-15 ENCOUNTER — Encounter: Payer: Self-pay | Admitting: Physical Therapy

## 2024-10-15 ENCOUNTER — Encounter: Admitting: Physical Therapy

## 2024-10-15 ENCOUNTER — Other Ambulatory Visit: Payer: Self-pay

## 2024-10-15 DIAGNOSIS — M5459 Other low back pain: Secondary | ICD-10-CM

## 2024-10-15 DIAGNOSIS — N2889 Other specified disorders of kidney and ureter: Secondary | ICD-10-CM

## 2024-10-15 DIAGNOSIS — R2689 Other abnormalities of gait and mobility: Secondary | ICD-10-CM | POA: Diagnosis not present

## 2024-10-15 DIAGNOSIS — M542 Cervicalgia: Secondary | ICD-10-CM | POA: Diagnosis not present

## 2024-10-15 DIAGNOSIS — M6281 Muscle weakness (generalized): Secondary | ICD-10-CM

## 2024-10-15 MED ORDER — GADOPICLENOL 0.5 MMOL/ML IV SOLN
8.0000 mL | Freq: Once | INTRAVENOUS | Status: AC | PRN
  Administered 2024-10-15: 8 mL via INTRAVENOUS

## 2024-10-15 NOTE — Therapy (Signed)
 " OUTPATIENT PHYSICAL THERAPY TREATMENT   Patient Name: Regina Marsh MRN: [id number] DOB:18-Dec-1945, 79 y.o., female Today's Date: 10/15/2024   END OF SESSION:  PT End of Session - 10/15/24 1011     Visit Number 3    Number of Visits 16    Date for Recertification  12/02/24    Authorization Type BCBS MCR    PT Start Time 1015    PT Stop Time 1100    PT Time Calculation (min) 45 min    Activity Tolerance Patient tolerated treatment well    Behavior During Therapy [facility] for tasks assessed/performed            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   REVERSE SHOULDER ARTHROPLASTY Right 02/09/2022   Procedure: REVERSE SHOULDER ARTHROPLASTY;  Surgeon: Cristy Bonner DASEN, MD;  Location: [facility];  Service: Orthopedics;  Laterality: Right;   SALIVARY GLAND SURGERY Left 1968   TONSILLECTOMY AND ADENOIDECTOMY  1951   Patient Active Problem List   Diagnosis Date Noted   Renal mass 09/25/2024   Lipoma of back 06/26/2024   Flatulence 03/07/2024   Hiatal hernia 01/30/2024   Cervical disc disorder with radiculopathy of cervical region 12/12/2023   Medication intolerance 03/30/2022   Coronary artery calcification seen on CT scan 03/30/2022   Vitamin D  deficiency 12/14/2021   Class 1 obesity due to excess calories without serious comorbidity with body mass index (BMI) of 30.0 to 30.9 in adult 12/14/2021   Sensation of fullness in right ear 10/12/2021   Mixed hyperlipidemia 03/09/2021   Chest pain of  uncertain etiology 03/09/2021   Primary hypertension 01/07/2021   Rash and other nonspecific skin eruption 12/10/2020   Drug reaction 12/10/2020   Sjogren's syndrome 06/23/2020   Essential hypertension, benign 06/23/2020   Episodic lightheadedness 06/23/2020    PCP: Chrystal Lamarr RAMAN, MD   REFERRING PROVIDER: Claudene Arthea HERO, DO  REFERRING DIAG: Balance problem; Coordination problem  THERAPY DIAG:  Other abnormalities of gait and mobility  Muscle weakness (generalized)  Other low back pain  Cervicalgia  Rationale for Evaluation and Treatment: Rehabilitation  ONSET DATE: Chronic   SUBJECTIVE:  SUBJECTIVE STATEMENT: Patient reports her balance has gotten better and stability with walking has improved. Her left hip is very sore. She did have to empty and fill a refrigerator yesterday and has been up/down stairs a lot this morning.   Eval: Patient reports she is having trouble walking. She states in November of 2022 she has surgery for pinched nerve in her neck and then 18 months later she started having episodes where she would get up and her legs would buckle or give out on her. Her most recent episode was back in August, but this was different because she wasn't having buckling but wasn't walking straight, but her gait has changed and her balance has become an issue.  She doesn't feel as steady. She also gets back, neck, and legs pain when she is grocery shopping or laundry, mainly on the left side. She also notes that she gets leg pain that will wake her up at night and standing will help alleviate the pain. She did see a neurologist and her right side is weaker, and she does get some numbness in her face from time to time, but denies any numbness in hands or feet. She also reports worsening of fine motor tasks.  PERTINENT HISTORY: See PMH above  PAIN:  Are you having pain? Yes:  NPRS scale: 7-8/10 Pain location: Left lateral hip Pain description: Soreness Aggravating  factors: Grocery shopping and laundry Relieving factors: Rest  PRECAUTIONS: None  PATIENT GOALS: Improve balance and walking   OBJECTIVE:  Note: Objective measures were completed at Evaluation unless otherwise noted. PATIENT SURVEYS:  PSFS: 4 (taking lowest number) Walking, no endurance, balance: 1-7 Laundry and grocery shopping, leg pain to neck: 4-8 Loss of strength of right side: 7  POSTURE:   Rounded shoulder posture  LOWER EXTREMITY MMT:  MMT Right eval Left eval  Hip flexion 4- 4-  Hip extension 3+ 3+  Hip abduction 3+ 3+  Hip adduction    Hip internal rotation    Hip external rotation    Knee flexion 4 4  Knee extension 4 4+  Ankle dorsiflexion    Ankle plantarflexion    Ankle inversion    Ankle eversion     (Blank rows = not tested)  FUNCTIONAL TESTS:  30 seconds chair stand test: 16 seconds SLS: approximately 15 seconds each  GAIT: Distance walked: 180 ft Assistive device utilized: None Level of assistance: Complete Independence Comments: Trendelenburg, decreased arm swing/trunk rotation                                                                                                                               TREATMENT  [facility] Adult PT Treatment:                                                DATE: 10/15/2024 Recumbent bike L3 x 8 min to improve her endurance and workload capacity Resisted lateral walking with L1 powerband at waist 2 x 20 steps down/back Resisted backward walking with L1 powerband at waist 2 x 45 ft Standing hip extension and abduction with yellow at knees 3 x 10 each Sit to stand holding 8# at chest x 20, with 10# 2 x 10 Row with black 3 x 10 Lateral 4 step-up 2 x 10 each  PATIENT EDUCATION:  Education details: HEP Person educated: Patient Education method: Programmer, Multimedia, Demonstration, Actor cues, Verbal cues Education comprehension: verbalized understanding, returned demonstration, verbal cues required, tactile cues required,  and needs further education  HOME EXERCISE PROGRAM: Access Code: [email or web address]  ASSESSMENT: CLINICAL IMPRESSION: Patient tolerated therapy well with no adverse effects. Therapy focused on improving her endurance, strength, and stability with good tolerance. She did arrive reporting increased left hip soreness that is likely due to addition of banded resistance for hip strengthening exercises and her recent increase in activity level. Patient did report left hip relief with exercise and was able to progress with her hip strengthening and incorporated walking stability exercises with good tolerance. No changes made to her HEP but discuss adding weight to her sit to stand exercise at home. Patient would benefit from continued skilled PT to progress mobility and strength in order to reduce pain and maximize functional ability.   Eval: Patient is a 79 y.o. female who was seen today for physical therapy evaluation and treatment for gait and balance difficulties, and weakness. She does exhibit some right > left LE and hip weakness, balance deficits, and gait deviations. She doesn't report any specific neurological symptoms and does not demonstrate any ataxic movements. Unclear etiology of her LE buckling episodes but she has not had these in quite some time. Therapy will focus on progressing her strength, balance training, endurance, and gait to maximize her functional ability.   OBJECTIVE IMPAIRMENTS: Abnormal gait, decreased activity tolerance, decreased balance, decreased strength, postural dysfunction, and pain.   ACTIVITY LIMITATIONS: locomotion level  PARTICIPATION LIMITATIONS: cleaning, laundry, shopping, and community activity  PERSONAL FACTORS: Fitness, Past/current experiences, and Time since onset of injury/illness/exacerbation are also affecting patient's functional outcome.    GOALS: Goals reviewed with patient? Yes  SHORT TERM GOALS: Target date: 11/04/2024  Patient will be I with  initial HEP in order to progress with therapy. Baseline: HEP provided at eval Goal status: INITIAL  2.  Patient will report pain with shopping and laundry </= 5/10 in order to reduce functional limitations Baseline: 7-8/10 Goal status: INITIAL  LONG TERM GOALS: Target date: 12/02/2024  Patient will be I with final HEP to maintain progress from PT. Baseline: HEP provided at eval Goal status: INITIAL  2.  Patient will report PSFS >/= 7 in order to indicate improvement in their functional ability. Baseline: 4 Goal status: INITIAL  3.  Patient will demonstrate knee strength 5/5 MMT and hip strength >/= 4/5 MMT in order to improve her walking tolerance and mobility Baseline: see limitations above Goal status: INITIAL  4.  Patient will demonstrate SLS >/= 30 seconds in order to improve balance and stability with walking and household tasks Baseline: 15 seconds Goal status: INITIAL   PLAN: PT FREQUENCY: 1-2x/week  PT DURATION: 8 weeks  PLANNED INTERVENTIONS: 97164- PT Re-evaluation, 97750- Physical Performance Testing, 97110-Therapeutic exercises, 97530- Therapeutic activity, 97112- Neuromuscular re-education, 97535- Self Care, 02859- Manual therapy, [phone]- Gait training, Patient/Family education, Balance training, Stair training, Joint mobilization, Joint manipulation, Spinal manipulation, and Spinal mobilization  PLAN FOR NEXT SESSION: Review HEP and progress PRN, progress LE/hip/core/posture strengthening, balance training, gait training, progress to more upright and weight bearing exercises   Elaine Daring, PT, DPT, LAT, ATC 10/15/2024  11:00 AM Phone: [phone] Fax: [phone]   "

## 2024-10-17 ENCOUNTER — Other Ambulatory Visit: Payer: Self-pay

## 2024-10-17 ENCOUNTER — Ambulatory Visit: Admitting: Physical Therapy

## 2024-10-17 ENCOUNTER — Encounter: Payer: Self-pay | Admitting: Physical Therapy

## 2024-10-17 DIAGNOSIS — M5459 Other low back pain: Secondary | ICD-10-CM | POA: Diagnosis not present

## 2024-10-17 DIAGNOSIS — M6281 Muscle weakness (generalized): Secondary | ICD-10-CM

## 2024-10-17 DIAGNOSIS — M542 Cervicalgia: Secondary | ICD-10-CM | POA: Diagnosis not present

## 2024-10-17 DIAGNOSIS — R2689 Other abnormalities of gait and mobility: Secondary | ICD-10-CM

## 2024-10-17 NOTE — Patient Instructions (Signed)
 Access Code: [email or web address] URL: [email or web address] Date: 10/17/2024 Prepared by: Elaine Daring  Exercises - Active Straight Leg Raise with Quad Set  - 1 x daily - 2 sets - 15 reps - Sit to Stand Without Arm Support  - 1 x daily - 3 sets - 10 reps - Standing Hip Extension with Counter Support  - 1 x daily - 3 sets - 10 reps - Side Stepping with Resistance at Thighs  - 1 x daily - 3 sets - 20 reps - Heel Raises with Counter Support  - 1 x daily - 3 sets - 10 reps - Standing Tandem Balance with Counter Support  - 1 x daily - 3 reps - 30 seconds hold - Standing Row with Anchored Resistance  - 1 x daily - 3 sets - 10 reps

## 2024-10-17 NOTE — Therapy (Signed)
 " OUTPATIENT PHYSICAL THERAPY TREATMENT   Patient Name: Regina Marsh MRN: [id number] DOB:1946-07-17, 79 y.o., female Today's Date: 10/17/2024   END OF SESSION:  PT End of Session - 10/17/24 1021     Visit Number 4    Number of Visits 16    Date for Recertification  12/02/24    Authorization Type BCBS MCR    PT Start Time 1018    PT Stop Time 1058    PT Time Calculation (min) 40 min    Activity Tolerance Patient tolerated treatment well    Behavior During Therapy [facility] for tasks assessed/performed             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   REVERSE SHOULDER ARTHROPLASTY Right 02/09/2022   Procedure: REVERSE SHOULDER ARTHROPLASTY;  Surgeon: Cristy Bonner DASEN, MD;  Location: [facility];  Service: Orthopedics;  Laterality: Right;   SALIVARY GLAND SURGERY Left 1968   TONSILLECTOMY AND ADENOIDECTOMY  1951   Patient Active Problem List   Diagnosis Date Noted   Renal mass 09/25/2024   Lipoma of back 06/26/2024   Flatulence 03/07/2024   Hiatal hernia 01/30/2024   Cervical disc disorder with radiculopathy of cervical region 12/12/2023   Medication intolerance 03/30/2022   Coronary artery calcification seen on CT scan 03/30/2022   Vitamin D  deficiency 12/14/2021   Class 1 obesity due to excess calories without serious comorbidity with body mass index (BMI) of 30.0 to 30.9 in adult 12/14/2021   Sensation of fullness in right ear 10/12/2021   Mixed hyperlipidemia 03/09/2021   Chest pain of  uncertain etiology 03/09/2021   Primary hypertension 01/07/2021   Rash and other nonspecific skin eruption 12/10/2020   Drug reaction 12/10/2020   Sjogren's syndrome 06/23/2020   Essential hypertension, benign 06/23/2020   Episodic lightheadedness 06/23/2020    PCP: Chrystal Lamarr RAMAN, MD   REFERRING PROVIDER: Claudene Arthea HERO, DO  REFERRING DIAG: Balance problem; Coordination problem  THERAPY DIAG:  Other abnormalities of gait and mobility  Muscle weakness (generalized)  Other low back pain  Cervicalgia  Rationale for Evaluation and Treatment: Rehabilitation  ONSET DATE: Chronic   SUBJECTIVE:  SUBJECTIVE STATEMENT: Patient reports her balance has gotten better and stability with walking has improved. Her left hip is very sore. She did have to empty and fill a refrigerator yesterday and has been up/down stairs a lot this morning.   Eval: Patient reports she is having trouble walking. She states in November of 2022 she has surgery for pinched nerve in her neck and then 18 months later she started having episodes where she would get up and her legs would buckle or give out on her. Her most recent episode was back in August, but this was different because she wasn't having buckling but wasn't walking straight, but her gait has changed and her balance has become an  issue. She doesn't feel as steady. She also gets back, neck, and legs pain when she is grocery shopping or laundry, mainly on the left side. She also notes that she gets leg pain that will wake her up at night and standing will help alleviate the pain. She did see a neurologist and her right side is weaker, and she does get some numbness in her face from time to time, but denies any numbness in hands or feet. She also reports worsening of fine motor tasks.  PERTINENT HISTORY: See PMH above  PAIN:  Are you having pain? Yes:  NPRS scale: 7-8/10 Pain location: Left lateral hip Pain description: Soreness Aggravating  factors: Grocery shopping and laundry Relieving factors: Rest  PRECAUTIONS: None  PATIENT GOALS: Improve balance and walking   OBJECTIVE:  Note: Objective measures were completed at Evaluation unless otherwise noted. PATIENT SURVEYS:  PSFS: 4 (taking lowest number) Walking, no endurance, balance: 1-7 Laundry and grocery shopping, leg pain to neck: 4-8 Loss of strength of right side: 7  POSTURE:   Rounded shoulder posture  LOWER EXTREMITY MMT:  MMT Right eval Left eval  Hip flexion 4- 4-  Hip extension 3+ 3+  Hip abduction 3+ 3+  Hip adduction    Hip internal rotation    Hip external rotation    Knee flexion 4 4  Knee extension 4 4+  Ankle dorsiflexion    Ankle plantarflexion    Ankle inversion    Ankle eversion     (Blank rows = not tested)  FUNCTIONAL TESTS:  30 seconds chair stand test: 16 seconds SLS: approximately 15 seconds each  GAIT: Distance walked: 180 ft Assistive device utilized: None Level of assistance: Complete Independence Comments: Trendelenburg, decreased arm swing/trunk rotation                                                                                                                               TREATMENT  [facility] Adult PT Treatment:                                                DATE: 10/17/2024 Recumbent bike L3 x 8 min to improve her endurance and workload capacity Lateral band walk with red at knees 3 x 25 down/back Sit to stand holding 10# at chest 3 x 10 Lateral step-up 4 box 2 x 10 each Tandem stance 3 x 30 sec each  PATIENT EDUCATION:  Education details: HEP update Person educated: Patient Education method: Explanation, Demonstration, Tactile cues, Verbal cues, Handout Education comprehension: verbalized understanding, returned demonstration, verbal cues required, tactile cues required, and needs further education  HOME EXERCISE PROGRAM: Access Code: [email or web address]    ASSESSMENT: CLINICAL IMPRESSION: Patient tolerated therapy  well with no adverse effects. Therapy focused on continued progression of her strengthening, endurance, and balance with  good tolerance. She was able to progress with her hip strengthening using increased resistance this visit, and progressed weight with her squatting. She does report feeling the left hip more than the right with her strengthening exercises and did exhibit greater difficulty with her balance training due to muscular fatigue. Updated her HEP to progress hip strengthening for home. Patient would benefit from continued skilled PT to progress mobility and strength in order to reduce pain and maximize functional ability.   Eval: Patient is a 80 y.o. female who was seen today for physical therapy evaluation and treatment for gait and balance difficulties, and weakness. She does exhibit some right > left LE and hip weakness, balance deficits, and gait deviations. She doesn't report any specific neurological symptoms and does not demonstrate any ataxic movements. Unclear etiology of her LE buckling episodes but she has not had these in quite some time. Therapy will focus on progressing her strength, balance training, endurance, and gait to maximize her functional ability.   OBJECTIVE IMPAIRMENTS: Abnormal gait, decreased activity tolerance, decreased balance, decreased strength, postural dysfunction, and pain.   ACTIVITY LIMITATIONS: locomotion level  PARTICIPATION LIMITATIONS: cleaning, laundry, shopping, and community activity  PERSONAL FACTORS: Fitness, Past/current experiences, and Time since onset of injury/illness/exacerbation are also affecting patient's functional outcome.    GOALS: Goals reviewed with patient? Yes  SHORT TERM GOALS: Target date: 11/04/2024  Patient will be I with initial HEP in order to progress with therapy. Baseline: HEP provided at eval Goal status: INITIAL  2.  Patient will report pain with shopping and laundry </= 5/10 in order to reduce functional  limitations Baseline: 7-8/10 Goal status: INITIAL  LONG TERM GOALS: Target date: 12/02/2024  Patient will be I with final HEP to maintain progress from PT. Baseline: HEP provided at eval Goal status: INITIAL  2.  Patient will report PSFS >/= 7 in order to indicate improvement in their functional ability. Baseline: 4 Goal status: INITIAL  3.  Patient will demonstrate knee strength 5/5 MMT and hip strength >/= 4/5 MMT in order to improve her walking tolerance and mobility Baseline: see limitations above Goal status: INITIAL  4.  Patient will demonstrate SLS >/= 30 seconds in order to improve balance and stability with walking and household tasks Baseline: 15 seconds Goal status: INITIAL   PLAN: PT FREQUENCY: 1-2x/week  PT DURATION: 8 weeks  PLANNED INTERVENTIONS: 97164- PT Re-evaluation, 97750- Physical Performance Testing, 97110-Therapeutic exercises, 97530- Therapeutic activity, 97112- Neuromuscular re-education, 97535- Self Care, [id number]- Manual therapy, [phone]- Gait training, Patient/Family education, Balance training, Stair training, Joint mobilization, Joint manipulation, Spinal manipulation, and Spinal mobilization  PLAN FOR NEXT SESSION: Review HEP and progress PRN, progress LE/hip/core/posture strengthening, balance training, gait training, progress to more upright and weight bearing exercises   Elaine Daring, PT, DPT, LAT, ATC 10/17/2024  11:01 AM Phone: [phone] Fax: [phone]   "

## 2024-10-21 ENCOUNTER — Other Ambulatory Visit: Payer: Self-pay

## 2024-10-21 ENCOUNTER — Ambulatory Visit: Admitting: Physical Therapy

## 2024-10-21 ENCOUNTER — Encounter: Payer: Self-pay | Admitting: Physical Therapy

## 2024-10-21 DIAGNOSIS — R2689 Other abnormalities of gait and mobility: Secondary | ICD-10-CM | POA: Diagnosis not present

## 2024-10-21 DIAGNOSIS — M6281 Muscle weakness (generalized): Secondary | ICD-10-CM | POA: Diagnosis not present

## 2024-10-21 NOTE — Therapy (Signed)
 " OUTPATIENT PHYSICAL THERAPY TREATMENT   Patient Name: Regina Marsh MRN: [id number] DOB:Jul 04, 1946, 79 y.o., female Today's Date: 10/21/2024   END OF SESSION:  PT End of Session - 10/21/24 1100     Visit Number 5    Number of Visits 16    Date for Recertification  12/02/24    Authorization Type BCBS MCR    PT Start Time 1056    PT Stop Time 1138    PT Time Calculation (min) 42 min    Activity Tolerance Patient tolerated treatment well    Behavior During Therapy [facility] for tasks assessed/performed              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   REVERSE SHOULDER ARTHROPLASTY Right 02/09/2022   Procedure: REVERSE SHOULDER ARTHROPLASTY;  Surgeon: Cristy Bonner DASEN, MD;  Location: [facility];  Service: Orthopedics;  Laterality: Right;   SALIVARY GLAND SURGERY Left 1968   TONSILLECTOMY AND ADENOIDECTOMY  1951   Patient Active Problem List   Diagnosis Date Noted   Renal mass 09/25/2024   Lipoma of back 06/26/2024   Flatulence 03/07/2024   Hiatal hernia 01/30/2024   Cervical disc disorder with radiculopathy of cervical region 12/12/2023   Medication intolerance 03/30/2022   Coronary artery calcification seen on CT scan 03/30/2022   Vitamin D  deficiency 12/14/2021   Class 1 obesity due to excess calories without serious comorbidity with body mass index (BMI) of 30.0 to 30.9 in adult 12/14/2021   Sensation of fullness in right ear 10/12/2021   Mixed hyperlipidemia 03/09/2021   Chest pain of  uncertain etiology 03/09/2021   Primary hypertension 01/07/2021   Rash and other nonspecific skin eruption 12/10/2020   Drug reaction 12/10/2020   Sjogren's syndrome 06/23/2020   Essential hypertension, benign 06/23/2020   Episodic lightheadedness 06/23/2020    PCP: Chrystal Lamarr RAMAN, MD   REFERRING PROVIDER: Claudene Arthea HERO, DO  REFERRING DIAG: Balance problem; Coordination problem  THERAPY DIAG:  Other abnormalities of gait and mobility  Muscle weakness (generalized)  Rationale for Evaluation and Treatment: Rehabilitation  ONSET DATE: Chronic   SUBJECTIVE:  SUBJECTIVE STATEMENT: Patient reports she did have some leg pain last night, more in her calves. She did do more activity around her home yesterday. Currently she denies any pain.   Eval: Patient reports she is having trouble walking. She states in November of 2022 she has surgery for pinched nerve in her neck and then 18 months later she started having episodes where she would get up and her legs would buckle or give out on her. Her most recent episode was back in August, but this was different because she wasn't having buckling but wasn't walking straight, but her gait has changed and her balance has become an issue. She doesn't feel as steady. She also gets back, neck, and legs pain when she  is grocery shopping or laundry, mainly on the left side. She also notes that she gets leg pain that will wake her up at night and standing will help alleviate the pain. She did see a neurologist and her right side is weaker, and she does get some numbness in her face from time to time, but denies any numbness in hands or feet. She also reports worsening of fine motor tasks.  PERTINENT HISTORY: See PMH above  PAIN:  Are you having pain? Yes:  NPRS scale: 0/10 Pain location: Left lateral hip Pain description: Soreness Aggravating factors: Grocery shopping and laundry Relieving factors: Rest  PRECAUTIONS: None  PATIENT GOALS:  Improve balance and walking   OBJECTIVE:  Note: Objective measures were completed at Evaluation unless otherwise noted. PATIENT SURVEYS:  PSFS: 4 (taking lowest number) Walking, no endurance, balance: 1-7 Laundry and grocery shopping, leg pain to neck: 4-8 Loss of strength of right side: 7  POSTURE:   Rounded shoulder posture  LOWER EXTREMITY MMT:  MMT Right eval Left eval  Hip flexion 4- 4-  Hip extension 3+ 3+  Hip abduction 3+ 3+  Hip adduction    Hip internal rotation    Hip external rotation    Knee flexion 4 4  Knee extension 4 4+  Ankle dorsiflexion    Ankle plantarflexion    Ankle inversion    Ankle eversion     (Blank rows = not tested)  FUNCTIONAL TESTS:  30 seconds chair stand test: 16 seconds SLS: approximately 15 seconds each  GAIT: Distance walked: 180 ft Assistive device utilized: None Level of assistance: Complete Independence Comments: Trendelenburg, decreased arm swing/trunk rotation                                                                                                                               TREATMENT  [facility] Adult PT Treatment:                                                DATE: 10/21/2024 Recumbent bike L3 x 8 min to improve her endurance and workload capacity Sit to stand holding 10# at chest 3 x 12 LAQ with 7.5# 3 x 8 each Forward 6 step-up 2 x 12 each Heel raises at counter 3 x 12  Deadlift with 15# 3 x 8 Seated horizontal abduction with yellow 3 x 8  PATIENT EDUCATION:  Education details: HEP update Person educated: Patient Education method: Explanation, Demonstration, Tactile cues, Verbal cues, Handout Education comprehension: verbalized understanding, returned demonstration, verbal cues required, tactile cues required, and needs further education  HOME EXERCISE PROGRAM: Access Code: [email or web address]    ASSESSMENT: CLINICAL IMPRESSION: Patient tolerated therapy well with no adverse effects. Therapy continues to focus on  progressing strength, endurance, and activity tolerance. She was able to progress with increased  weight and/or reps for LE strengthening and incorporated lifting and more postural strengthening with good tolerance. She does report that her night leg pains do seem to be related to amount of bending activities she does at home. Updated HEP to progress her postural strengthening exercises. Patient would benefit from continued skilled PT to progress mobility and strength in order to reduce pain and maximize functional ability.   Eval: Patient is a 79 y.o. female who was seen today for physical therapy evaluation and treatment for gait and balance difficulties, and weakness. She does exhibit some right > left LE and hip weakness, balance deficits, and gait deviations. She doesn't report any specific neurological symptoms and does not demonstrate any ataxic movements. Unclear etiology of her LE buckling episodes but she has not had these in quite some time. Therapy will focus on progressing her strength, balance training, endurance, and gait to maximize her functional ability.   OBJECTIVE IMPAIRMENTS: Abnormal gait, decreased activity tolerance, decreased balance, decreased strength, postural dysfunction, and pain.   ACTIVITY LIMITATIONS: locomotion level  PARTICIPATION LIMITATIONS: cleaning, laundry, shopping, and community activity  PERSONAL FACTORS: Fitness, Past/current experiences, and Time since onset of injury/illness/exacerbation are also affecting patient's functional outcome.    GOALS: Goals reviewed with patient? Yes  SHORT TERM GOALS: Target date: 11/04/2024  Patient will be I with initial HEP in order to progress with therapy. Baseline: HEP provided at eval Goal status: INITIAL  2.  Patient will report pain with shopping and laundry </= 5/10 in order to reduce functional limitations Baseline: 7-8/10 Goal status: INITIAL  LONG TERM GOALS: Target date: 12/02/2024  Patient will be I  with final HEP to maintain progress from PT. Baseline: HEP provided at eval Goal status: INITIAL  2.  Patient will report PSFS >/= 7 in order to indicate improvement in their functional ability. Baseline: 4 Goal status: INITIAL  3.  Patient will demonstrate knee strength 5/5 MMT and hip strength >/= 4/5 MMT in order to improve her walking tolerance and mobility Baseline: see limitations above Goal status: INITIAL  4.  Patient will demonstrate SLS >/= 30 seconds in order to improve balance and stability with walking and household tasks Baseline: 15 seconds Goal status: INITIAL   PLAN: PT FREQUENCY: 1-2x/week  PT DURATION: 8 weeks  PLANNED INTERVENTIONS: 97164- PT Re-evaluation, 97750- Physical Performance Testing, 97110-Therapeutic exercises, 97530- Therapeutic activity, 97112- Neuromuscular re-education, 97535- Self Care, 02859- Manual therapy, [phone]- Gait training, Patient/Family education, Balance training, Stair training, Joint mobilization, Joint manipulation, Spinal manipulation, and Spinal mobilization  PLAN FOR NEXT SESSION: Review HEP and progress PRN, progress LE/hip/core/posture strengthening, balance training, gait training, progress to more upright and weight bearing exercises   Elaine Daring, PT, DPT, LAT, ATC 10/21/2024  11:41 AM Phone: [phone] Fax: [phone]   "

## 2024-10-23 ENCOUNTER — Other Ambulatory Visit: Payer: Self-pay

## 2024-10-23 ENCOUNTER — Encounter: Payer: Self-pay | Admitting: Physical Therapy

## 2024-10-23 ENCOUNTER — Ambulatory Visit: Admitting: Physical Therapy

## 2024-10-23 DIAGNOSIS — M6281 Muscle weakness (generalized): Secondary | ICD-10-CM

## 2024-10-23 DIAGNOSIS — M5459 Other low back pain: Secondary | ICD-10-CM

## 2024-10-23 DIAGNOSIS — M542 Cervicalgia: Secondary | ICD-10-CM

## 2024-10-23 DIAGNOSIS — R2689 Other abnormalities of gait and mobility: Secondary | ICD-10-CM

## 2024-10-23 NOTE — Therapy (Signed)
 " OUTPATIENT PHYSICAL THERAPY TREATMENT   Patient Name: Regina Marsh MRN: [id number] DOB:15-Dec-1945, 79 y.o., female Today's Date: 10/23/2024   END OF SESSION:  PT End of Session - 10/23/24 1005     Visit Number 6    Number of Visits 16    Date for Recertification  12/02/24    Authorization Type BCBS MCR    PT Start Time 1015    PT Stop Time 1100    PT Time Calculation (min) 45 min    Activity Tolerance Patient tolerated treatment well    Behavior During Therapy [facility] for tasks assessed/performed               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   REVERSE SHOULDER ARTHROPLASTY Right 02/09/2022   Procedure: REVERSE SHOULDER ARTHROPLASTY;  Surgeon: Cristy Bonner DASEN, MD;  Location: [facility];  Service: Orthopedics;  Laterality: Right;   SALIVARY GLAND SURGERY Left 1968   TONSILLECTOMY AND ADENOIDECTOMY  1951   Patient Active Problem List   Diagnosis Date Noted   Renal mass 09/25/2024   Lipoma of back 06/26/2024   Flatulence 03/07/2024   Hiatal hernia 01/30/2024   Cervical disc disorder with radiculopathy of cervical region 12/12/2023   Medication intolerance 03/30/2022   Coronary artery calcification seen on CT scan 03/30/2022   Vitamin D  deficiency 12/14/2021   Class 1 obesity due to excess calories without serious comorbidity with body mass index (BMI) of 30.0 to 30.9 in adult 12/14/2021   Sensation of fullness in right ear 10/12/2021   Mixed hyperlipidemia 03/09/2021   Chest pain  of uncertain etiology 03/09/2021   Primary hypertension 01/07/2021   Rash and other nonspecific skin eruption 12/10/2020   Drug reaction 12/10/2020   Sjogren's syndrome 06/23/2020   Essential hypertension, benign 06/23/2020   Episodic lightheadedness 06/23/2020    PCP: Chrystal Lamarr RAMAN, MD   REFERRING PROVIDER: Claudene Arthea HERO, DO  REFERRING DIAG: Balance problem; Coordination problem  THERAPY DIAG:  Other abnormalities of gait and mobility  Muscle weakness (generalized)  Other low back pain  Cervicalgia  Rationale for Evaluation and Treatment: Rehabilitation  ONSET DATE: Chronic   SUBJECTIVE:  SUBJECTIVE STATEMENT: Patient reports she is doing well. She did have her eyes dilated yesterday so was not able to do as many of the exercises.  Eval: Patient reports she is having trouble walking. She states in November of 2022 she has surgery for pinched nerve in her neck and then 18 months later she started having episodes where she would get up and her legs would buckle or give out on her. Her most recent episode was back in August, but this was different because she wasn't having buckling but wasn't walking straight, but her gait has changed and her balance has become an issue. She doesn't feel as steady. She also gets back, neck, and legs  pain when she is grocery shopping or laundry, mainly on the left side. She also notes that she gets leg pain that will wake her up at night and standing will help alleviate the pain. She did see a neurologist and her right side is weaker, and she does get some numbness in her face from time to time, but denies any numbness in hands or feet. She also reports worsening of fine motor tasks.  PERTINENT HISTORY: See PMH above  PAIN:  Are you having pain? Yes:  NPRS scale: 0/10 Pain location: Left lateral hip Pain description: Soreness Aggravating factors: Grocery shopping and laundry Relieving factors: Rest  PRECAUTIONS: None  PATIENT  GOALS: Improve balance and walking   OBJECTIVE:  Note: Objective measures were completed at Evaluation unless otherwise noted. PATIENT SURVEYS:  PSFS: 4 (taking lowest number) Walking, no endurance, balance: 1-7 Laundry and grocery shopping, leg pain to neck: 4-8 Loss of strength of right side: 7  POSTURE:   Rounded shoulder posture  LOWER EXTREMITY MMT:  MMT Right eval Left eval  Hip flexion 4- 4-  Hip extension 3+ 3+  Hip abduction 3+ 3+  Hip adduction    Hip internal rotation    Hip external rotation    Knee flexion 4 4  Knee extension 4 4+  Ankle dorsiflexion    Ankle plantarflexion    Ankle inversion    Ankle eversion     (Blank rows = not tested)  FUNCTIONAL TESTS:  30 seconds chair stand test: 16 seconds SLS: approximately 15 seconds each  GAIT: Distance walked: 180 ft Assistive device utilized: None Level of assistance: Complete Independence Comments: Trendelenburg, decreased arm swing/trunk rotation                                                                                                                               TREATMENT  [facility] Adult PT Treatment:                                                DATE: 10/23/2024 Recumbent bike L3 x 8 min to improve her endurance and workload capacity Sit to stand holding 10# at chest 3 x 15 Side clamshell with green 2 x 15 each LAQ with 7.5# 3 x 10 each Forward 8 step-up 2 x 10 each Row with blue 3 x 15 Deadlift with 15# 3 x 10  PATIENT EDUCATION:  Education details: HEP Person educated: Patient Education method: Programmer, Multimedia, Demonstration, Actor cues, Verbal cues Education comprehension: verbalized understanding, returned demonstration, verbal cues required, tactile cues required, and needs further education  HOME EXERCISE PROGRAM: Access Code: [email or web address]    ASSESSMENT: CLINICAL IMPRESSION: Patient tolerated therapy well with no adverse effects. Therapy continues to focus on progressing her  strengthening with good tolerance. She was able to increase reps with her  squatting tasks and height of her step-ups without any increase pain. She does report muscle burn primarily in her quads with exercises. No changes made to her HEP this visit. Patient would benefit from continued skilled PT to progress mobility and strength in order to reduce pain and maximize functional ability.   Eval: Patient is a 79 y.o. female who was seen today for physical therapy evaluation and treatment for gait and balance difficulties, and weakness. She does exhibit some right > left LE and hip weakness, balance deficits, and gait deviations. She doesn't report any specific neurological symptoms and does not demonstrate any ataxic movements. Unclear etiology of her LE buckling episodes but she has not had these in quite some time. Therapy will focus on progressing her strength, balance training, endurance, and gait to maximize her functional ability.   OBJECTIVE IMPAIRMENTS: Abnormal gait, decreased activity tolerance, decreased balance, decreased strength, postural dysfunction, and pain.   ACTIVITY LIMITATIONS: locomotion level  PARTICIPATION LIMITATIONS: cleaning, laundry, shopping, and community activity  PERSONAL FACTORS: Fitness, Past/current experiences, and Time since onset of injury/illness/exacerbation are also affecting patient's functional outcome.    GOALS: Goals reviewed with patient? Yes  SHORT TERM GOALS: Target date: 11/04/2024  Patient will be I with initial HEP in order to progress with therapy. Baseline: HEP provided at eval Goal status: INITIAL  2.  Patient will report pain with shopping and laundry </= 5/10 in order to reduce functional limitations Baseline: 7-8/10 Goal status: INITIAL  LONG TERM GOALS: Target date: 12/02/2024  Patient will be I with final HEP to maintain progress from PT. Baseline: HEP provided at eval Goal status: INITIAL  2.  Patient will report PSFS >/= 7 in  order to indicate improvement in their functional ability. Baseline: 4 Goal status: INITIAL  3.  Patient will demonstrate knee strength 5/5 MMT and hip strength >/= 4/5 MMT in order to improve her walking tolerance and mobility Baseline: see limitations above Goal status: INITIAL  4.  Patient will demonstrate SLS >/= 30 seconds in order to improve balance and stability with walking and household tasks Baseline: 15 seconds Goal status: INITIAL   PLAN: PT FREQUENCY: 1-2x/week  PT DURATION: 8 weeks  PLANNED INTERVENTIONS: 97164- PT Re-evaluation, 97750- Physical Performance Testing, 97110-Therapeutic exercises, 97530- Therapeutic activity, 97112- Neuromuscular re-education, 97535- Self Care, [id number]- Manual therapy, [phone]- Gait training, Patient/Family education, Balance training, Stair training, Joint mobilization, Joint manipulation, Spinal manipulation, and Spinal mobilization  PLAN FOR NEXT SESSION: Review HEP and progress PRN, progress LE/hip/core/posture strengthening, balance training, gait training, progress to more upright and weight bearing exercises   Elaine Daring, PT, DPT, LAT, ATC 10/23/2024  11:02 AM Phone: [phone] Fax: [phone]   "

## 2024-10-29 ENCOUNTER — Encounter: Payer: Self-pay | Admitting: Physical Therapy

## 2024-10-29 ENCOUNTER — Ambulatory Visit: Admitting: Physical Therapy

## 2024-10-29 ENCOUNTER — Other Ambulatory Visit: Payer: Self-pay

## 2024-10-29 DIAGNOSIS — M542 Cervicalgia: Secondary | ICD-10-CM

## 2024-10-29 DIAGNOSIS — R2689 Other abnormalities of gait and mobility: Secondary | ICD-10-CM | POA: Diagnosis not present

## 2024-10-29 DIAGNOSIS — M6281 Muscle weakness (generalized): Secondary | ICD-10-CM

## 2024-10-29 DIAGNOSIS — M5459 Other low back pain: Secondary | ICD-10-CM | POA: Diagnosis not present

## 2024-10-29 NOTE — Therapy (Signed)
 " OUTPATIENT PHYSICAL THERAPY TREATMENT   Patient Name: Regina Marsh MRN: [id number] DOB:10/03/1946, 79 y.o., female Today's Date: 10/29/2024   END OF SESSION:  PT End of Session - 10/29/24 1254     Visit Number 7    Number of Visits 16    Date for Recertification  12/02/24    Authorization Type BCBS MCR    PT Start Time 1300    PT Stop Time 1340    PT Time Calculation (min) 40 min    Activity Tolerance Patient tolerated treatment well    Behavior During Therapy [facility] for tasks assessed/performed                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   REVERSE SHOULDER ARTHROPLASTY Right 02/09/2022   Procedure: REVERSE SHOULDER ARTHROPLASTY;  Surgeon: Cristy Bonner DASEN, MD;  Location: [facility];  Service: Orthopedics;  Laterality: Right;   SALIVARY GLAND SURGERY Left 1968   TONSILLECTOMY AND ADENOIDECTOMY  1951   Patient Active Problem List   Diagnosis Date Noted   Renal mass 09/25/2024   Lipoma of back 06/26/2024   Flatulence 03/07/2024   Hiatal hernia 01/30/2024   Cervical disc disorder with radiculopathy of cervical region 12/12/2023   Medication intolerance 03/30/2022   Coronary artery calcification seen on CT scan 03/30/2022   Vitamin D  deficiency 12/14/2021   Class 1 obesity due to excess calories without serious comorbidity with body mass index (BMI) of 30.0 to 30.9 in adult 12/14/2021   Sensation of fullness in right ear 10/12/2021   Mixed hyperlipidemia 03/09/2021   Chest pain  of uncertain etiology 03/09/2021   Primary hypertension 01/07/2021   Rash and other nonspecific skin eruption 12/10/2020   Drug reaction 12/10/2020   Sjogren's syndrome 06/23/2020   Essential hypertension, benign 06/23/2020   Episodic lightheadedness 06/23/2020    PCP: Chrystal Lamarr RAMAN, MD   REFERRING PROVIDER: Claudene Arthea HERO, DO  REFERRING DIAG: Balance problem; Coordination problem  THERAPY DIAG:  Other abnormalities of gait and mobility  Muscle weakness (generalized)  Other low back pain  Cervicalgia  Rationale for Evaluation and Treatment: Rehabilitation  ONSET DATE: Chronic   SUBJECTIVE:  SUBJECTIVE STATEMENT: Patient reports she came down with a stomach bug last week and is still recovering a little bit.  Eval: Patient reports she is having trouble walking. She states in November of 2022 she has surgery for pinched nerve in her neck and then 18 months later she started having episodes where she would get up and her legs would buckle or give out on her. Her most recent episode was back in August, but this was different because she wasn't having buckling but wasn't walking straight, but her gait has changed and her balance has become an issue. She doesn't feel as steady. She also gets back, neck, and legs pain when she is grocery  shopping or laundry, mainly on the left side. She also notes that she gets leg pain that will wake her up at night and standing will help alleviate the pain. She did see a neurologist and her right side is weaker, and she does get some numbness in her face from time to time, but denies any numbness in hands or feet. She also reports worsening of fine motor tasks.  PERTINENT HISTORY: See PMH above  PAIN:  Are you having pain? Yes:  NPRS scale: 0/10 Pain location: Left lateral hip Pain description: Soreness Aggravating factors: Grocery shopping and laundry Relieving factors: Rest  PRECAUTIONS: None  PATIENT GOALS: Improve balance and  walking   OBJECTIVE:  Note: Objective measures were completed at Evaluation unless otherwise noted. PATIENT SURVEYS:  PSFS: 4 (taking lowest number) Walking, no endurance, balance: 1-7 Laundry and grocery shopping, leg pain to neck: 4-8 Loss of strength of right side: 7  POSTURE:   Rounded shoulder posture  LOWER EXTREMITY MMT:  MMT Right eval Left eval  Hip flexion 4- 4-  Hip extension 3+ 3+  Hip abduction 3+ 3+  Hip adduction    Hip internal rotation    Hip external rotation    Knee flexion 4 4  Knee extension 4 4+  Ankle dorsiflexion    Ankle plantarflexion    Ankle inversion    Ankle eversion     (Blank rows = not tested)  FUNCTIONAL TESTS:  30 seconds chair stand test: 16 seconds SLS: approximately 15 seconds each  GAIT: Distance walked: 180 ft Assistive device utilized: None Level of assistance: Complete Independence Comments: Trendelenburg, decreased arm swing/trunk rotation                                                                                                                               TREATMENT  [facility] Adult PT Treatment:                                                DATE: 10/29/2024 Recumbent bike L3 x 5 min to improve her endurance and workload capacity LAQ with 5# 3 x 10 each SLR with 2# 2 x 15 Sidelying hip abduction with 2# 2 x 10 each Sit to stand holding 8# at chest 3 x 10 Forward 6 step-up 2 x 10 each Row with blue 3 x 15 Tandem stance 3 x 30 sec  PATIENT EDUCATION:  Education details: HEP Person educated: Patient Education method: Programmer, Multimedia, Demonstration, Actor cues, Verbal cues Education comprehension: verbalized understanding, returned demonstration, verbal cues required, tactile cues required, and needs further education  HOME EXERCISE PROGRAM: Access Code: [email or web address]    ASSESSMENT: CLINICAL IMPRESSION: Patient tolerated therapy well with no adverse effects. Therapy focused on continued strengthening with good  tolerance. Weights slightly reduced for exercises as patient continues to  recover from recent illness. She did reports difficulty with her hip strengthening and general muscular fatigue following therapy. No changes made to her HEP this visit. Patient would benefit from continued skilled PT to progress mobility and strength in order to reduce pain and maximize functional ability.   Eval: Patient is a 79 y.o. female who was seen today for physical therapy evaluation and treatment for gait and balance difficulties, and weakness. She does exhibit some right > left LE and hip weakness, balance deficits, and gait deviations. She doesn't report any specific neurological symptoms and does not demonstrate any ataxic movements. Unclear etiology of her LE buckling episodes but she has not had these in quite some time. Therapy will focus on progressing her strength, balance training, endurance, and gait to maximize her functional ability.   OBJECTIVE IMPAIRMENTS: Abnormal gait, decreased activity tolerance, decreased balance, decreased strength, postural dysfunction, and pain.   ACTIVITY LIMITATIONS: locomotion level  PARTICIPATION LIMITATIONS: cleaning, laundry, shopping, and community activity  PERSONAL FACTORS: Fitness, Past/current experiences, and Time since onset of injury/illness/exacerbation are also affecting patient's functional outcome.    GOALS: Goals reviewed with patient? Yes  SHORT TERM GOALS: Target date: 11/04/2024  Patient will be I with initial HEP in order to progress with therapy. Baseline: HEP provided at eval Goal status: INITIAL  2.  Patient will report pain with shopping and laundry </= 5/10 in order to reduce functional limitations Baseline: 7-8/10 Goal status: INITIAL  LONG TERM GOALS: Target date: 12/02/2024  Patient will be I with final HEP to maintain progress from PT. Baseline: HEP provided at eval Goal status: INITIAL  2.  Patient will report PSFS >/= 7 in order to  indicate improvement in their functional ability. Baseline: 4 Goal status: INITIAL  3.  Patient will demonstrate knee strength 5/5 MMT and hip strength >/= 4/5 MMT in order to improve her walking tolerance and mobility Baseline: see limitations above Goal status: INITIAL  4.  Patient will demonstrate SLS >/= 30 seconds in order to improve balance and stability with walking and household tasks Baseline: 15 seconds Goal status: INITIAL   PLAN: PT FREQUENCY: 1-2x/week  PT DURATION: 8 weeks  PLANNED INTERVENTIONS: 97164- PT Re-evaluation, 97750- Physical Performance Testing, 97110-Therapeutic exercises, 97530- Therapeutic activity, 97112- Neuromuscular re-education, 97535- Self Care, 02859- Manual therapy, [phone]- Gait training, Patient/Family education, Balance training, Stair training, Joint mobilization, Joint manipulation, Spinal manipulation, and Spinal mobilization  PLAN FOR NEXT SESSION: Review HEP and progress PRN, progress LE/hip/core/posture strengthening, balance training, gait training, progress to more upright and weight bearing exercises   Elaine Daring, PT, DPT, LAT, ATC 10/29/24  1:48 PM Phone: [phone] Fax: [phone]   "

## 2024-10-30 ENCOUNTER — Ambulatory Visit: Payer: Self-pay | Admitting: Family Medicine

## 2024-10-31 ENCOUNTER — Encounter: Payer: Self-pay | Admitting: Physical Therapy

## 2024-10-31 ENCOUNTER — Ambulatory Visit: Admitting: Physical Therapy

## 2024-10-31 ENCOUNTER — Other Ambulatory Visit: Payer: Self-pay

## 2024-10-31 DIAGNOSIS — M5459 Other low back pain: Secondary | ICD-10-CM | POA: Diagnosis not present

## 2024-10-31 DIAGNOSIS — M6281 Muscle weakness (generalized): Secondary | ICD-10-CM | POA: Diagnosis not present

## 2024-10-31 DIAGNOSIS — R2689 Other abnormalities of gait and mobility: Secondary | ICD-10-CM

## 2024-10-31 DIAGNOSIS — M542 Cervicalgia: Secondary | ICD-10-CM

## 2024-10-31 NOTE — Therapy (Signed)
 " OUTPATIENT PHYSICAL THERAPY TREATMENT   Patient Name: Regina Marsh MRN: [id number] DOB:Sep 13, 1946, 79 y.o., female Today's Date: 10/31/2024   END OF SESSION:  PT End of Session - 10/31/24 1032     Visit Number 8    Number of Visits 16    Date for Recertification  12/02/24    Authorization Type BCBS MCR    PT Start Time 1025    PT Stop Time 1105    PT Time Calculation (min) 40 min    Activity Tolerance Patient tolerated treatment well    Behavior During Therapy [facility] for tasks assessed/performed                 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   REVERSE SHOULDER ARTHROPLASTY Right 02/09/2022   Procedure: REVERSE SHOULDER ARTHROPLASTY;  Surgeon: Cristy Bonner DASEN, MD;  Location: [facility];  Service: Orthopedics;  Laterality: Right;   SALIVARY GLAND SURGERY Left 1968   TONSILLECTOMY AND ADENOIDECTOMY  1951   Patient Active Problem List   Diagnosis Date Noted   Renal mass 09/25/2024   Lipoma of back 06/26/2024   Flatulence 03/07/2024   Hiatal hernia 01/30/2024   Cervical disc disorder with radiculopathy of cervical region 12/12/2023   Medication intolerance 03/30/2022   Coronary artery calcification seen on CT scan 03/30/2022   Vitamin D  deficiency 12/14/2021   Class 1 obesity due to excess calories without serious comorbidity with body mass index (BMI) of 30.0 to 30.9 in adult 12/14/2021   Sensation of fullness in right ear 10/12/2021   Mixed hyperlipidemia 03/09/2021   Chest  pain of uncertain etiology 03/09/2021   Primary hypertension 01/07/2021   Rash and other nonspecific skin eruption 12/10/2020   Drug reaction 12/10/2020   Sjogren's syndrome 06/23/2020   Essential hypertension, benign 06/23/2020   Episodic lightheadedness 06/23/2020    PCP: Chrystal Lamarr RAMAN, MD   REFERRING PROVIDER: Claudene Arthea HERO, DO  REFERRING DIAG: Balance problem; Coordination problem  THERAPY DIAG:  Other abnormalities of gait and mobility  Muscle weakness (generalized)  Other low back pain  Cervicalgia  Rationale for Evaluation and Treatment: Rehabilitation  ONSET DATE: Chronic   SUBJECTIVE:  SUBJECTIVE STATEMENT: Patient reports the left thigh and hip have been giving her more trouble since her illness.   Eval: Patient reports she is having trouble walking. She states in November of 2022 she has surgery for pinched nerve in her neck and then 18 months later she started having episodes where she would get up and her legs would buckle or give out on her. Her most recent episode was back in August, but this was different because she wasn't having buckling but wasn't walking straight, but her gait has changed and her balance has become an issue. She doesn't feel as steady. She also gets back, neck, and legs pain when she is grocery  shopping or laundry, mainly on the left side. She also notes that she gets leg pain that will wake her up at night and standing will help alleviate the pain. She did see a neurologist and her right side is weaker, and she does get some numbness in her face from time to time, but denies any numbness in hands or feet. She also reports worsening of fine motor tasks.  PERTINENT HISTORY: See PMH above  PAIN:  Are you having pain? Yes:  NPRS scale: 0/10 Pain location: Left lateral hip Pain description: Soreness Aggravating factors: Grocery shopping and laundry Relieving factors: Rest  PRECAUTIONS: None  PATIENT GOALS: Improve balance and  walking   OBJECTIVE:  Note: Objective measures were completed at Evaluation unless otherwise noted. PATIENT SURVEYS:  PSFS: 4 (taking lowest number) Walking, no endurance, balance: 1-7 Laundry and grocery shopping, leg pain to neck: 4-8 Loss of strength of right side: 7  POSTURE:   Rounded shoulder posture  LOWER EXTREMITY MMT:  MMT Right eval Left eval  Hip flexion 4- 4-  Hip extension 3+ 3+  Hip abduction 3+ 3+  Hip adduction    Hip internal rotation    Hip external rotation    Knee flexion 4 4  Knee extension 4 4+  Ankle dorsiflexion    Ankle plantarflexion    Ankle inversion    Ankle eversion     (Blank rows = not tested)  FUNCTIONAL TESTS:  30 seconds chair stand test: 16 seconds SLS: approximately 15 seconds each  GAIT: Distance walked: 180 ft Assistive device utilized: None Level of assistance: Complete Independence Comments: Trendelenburg, decreased arm swing/trunk rotation                                                                                                                               TREATMENT  [facility] Adult PT Treatment:                                                DATE: 10/31/2024 Recumbent bike L3 x 6 min to improve her endurance and workload capacity Sit to stand holding 10# at chest 3 x 10 LAQ with 7.5# 3 x 10 each Forward heel tap on 4 box 2 x 10 each Forward step-up on 8 box 2 x 10 each Lateral band walk with red at knees 2 x 20 down/back  PATIENT EDUCATION:  Education details: HEP Person educated: Patient Education method: Programmer, Multimedia, Demonstration, Tactile cues, Verbal cues Education comprehension: verbalized understanding, returned demonstration, verbal cues required, tactile cues required, and needs further education  HOME EXERCISE PROGRAM: Access Code: [email or web address]    ASSESSMENT: CLINICAL IMPRESSION: Patient tolerated therapy well with no adverse effects. Therapy focused on continued strengthening for the LE with good  tolerance. She was reporting some right knee pain and left thigh/hip  discomfort with exercises, and she had an episode where she reported her blood pressure dropping and became light headed, but this resolved relatively quickly with rest and water . She is still progressing back to her prior activity level since her illness but was able to tolerate more today. No changes made to her HEP this visit. Patient would benefit from continued skilled PT to progress mobility and strength in order to reduce pain and maximize functional ability.   Eval: Patient is a 79 y.o. female who was seen today for physical therapy evaluation and treatment for gait and balance difficulties, and weakness. She does exhibit some right > left LE and hip weakness, balance deficits, and gait deviations. She doesn't report any specific neurological symptoms and does not demonstrate any ataxic movements. Unclear etiology of her LE buckling episodes but she has not had these in quite some time. Therapy will focus on progressing her strength, balance training, endurance, and gait to maximize her functional ability.   OBJECTIVE IMPAIRMENTS: Abnormal gait, decreased activity tolerance, decreased balance, decreased strength, postural dysfunction, and pain.   ACTIVITY LIMITATIONS: locomotion level  PARTICIPATION LIMITATIONS: cleaning, laundry, shopping, and community activity  PERSONAL FACTORS: Fitness, Past/current experiences, and Time since onset of injury/illness/exacerbation are also affecting patient's functional outcome.    GOALS: Goals reviewed with patient? Yes  SHORT TERM GOALS: Target date: 11/04/2024  Patient will be I with initial HEP in order to progress with therapy. Baseline: HEP provided at eval Goal status: INITIAL  2.  Patient will report pain with shopping and laundry </= 5/10 in order to reduce functional limitations Baseline: 7-8/10 Goal status: INITIAL  LONG TERM GOALS: Target date: 12/02/2024  Patient  will be I with final HEP to maintain progress from PT. Baseline: HEP provided at eval Goal status: INITIAL  2.  Patient will report PSFS >/= 7 in order to indicate improvement in their functional ability. Baseline: 4 Goal status: INITIAL  3.  Patient will demonstrate knee strength 5/5 MMT and hip strength >/= 4/5 MMT in order to improve her walking tolerance and mobility Baseline: see limitations above Goal status: INITIAL  4.  Patient will demonstrate SLS >/= 30 seconds in order to improve balance and stability with walking and household tasks Baseline: 15 seconds Goal status: INITIAL   PLAN: PT FREQUENCY: 1-2x/week  PT DURATION: 8 weeks  PLANNED INTERVENTIONS: 97164- PT Re-evaluation, 97750- Physical Performance Testing, 97110-Therapeutic exercises, 97530- Therapeutic activity, 97112- Neuromuscular re-education, 97535- Self Care, [id number]- Manual therapy, [phone]- Gait training, Patient/Family education, Balance training, Stair training, Joint mobilization, Joint manipulation, Spinal manipulation, and Spinal mobilization  PLAN FOR NEXT SESSION: Review HEP and progress PRN, progress LE/hip/core/posture strengthening, balance training, gait training, progress to more upright and weight bearing exercises   Elaine Daring, PT, DPT, LAT, ATC 10/31/24  11:16 AM Phone: [phone] Fax: [phone]   "

## 2024-11-04 ENCOUNTER — Encounter: Admitting: Physical Therapy

## 2024-11-06 ENCOUNTER — Ambulatory Visit: Admitting: Physical Therapy

## 2024-11-06 ENCOUNTER — Other Ambulatory Visit: Payer: Self-pay

## 2024-11-06 ENCOUNTER — Encounter: Payer: Self-pay | Admitting: Physical Therapy

## 2024-11-06 DIAGNOSIS — M542 Cervicalgia: Secondary | ICD-10-CM | POA: Diagnosis not present

## 2024-11-06 DIAGNOSIS — R2689 Other abnormalities of gait and mobility: Secondary | ICD-10-CM

## 2024-11-06 DIAGNOSIS — M6281 Muscle weakness (generalized): Secondary | ICD-10-CM

## 2024-11-06 DIAGNOSIS — M5459 Other low back pain: Secondary | ICD-10-CM | POA: Diagnosis not present

## 2024-11-06 NOTE — Patient Instructions (Signed)
 Access Code: [email or web address] URL: [email or web address] Date: 11/06/2024 Prepared by: Elaine Daring  Exercises - Active Straight Leg Raise with Quad Set  - 1 x daily - 3 sets - 15 reps - Clam with Resistance  - 1 x daily - 3 sets - 10 reps - Bridge with Resistance  - 1 x daily - 3 sets - 10 reps - Heel Raises with Counter Support  - 1 x daily - 3 sets - 15 reps - Standing Hip Extension with Resistance at Ankles and Counter Support  - 1 x daily - 2 sets - 10 reps - Standing Hip Abduction with Resistance at Ankles and Counter Support  - 1 x daily - 2 sets - 10 reps - Standing Tandem Balance with Counter Support  - 1 x daily - 3 reps - 30 seconds hold - Standing Row with Anchored Resistance  - 1 x daily - 3 sets - 10 reps - Standing Shoulder Horizontal Abduction with Resistance  - 1 x daily - 3 sets - 10 reps

## 2024-11-06 NOTE — Therapy (Signed)
 " OUTPATIENT PHYSICAL THERAPY TREATMENT   Patient Name: Regina Marsh MRN: [id number] DOB:02/19/1946, 79 y.o., female Today's Date: 11/06/2024   END OF SESSION:  PT End of Session - 11/06/24 1047     Visit Number 9    Number of Visits 16    Date for Recertification  12/02/24    Authorization Type BCBS MCR    PT Start Time 1030    PT Stop Time 1110    PT Time Calculation (min) 40 min    Activity Tolerance Patient tolerated treatment well    Behavior During Therapy [facility] for tasks assessed/performed                  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   REVERSE SHOULDER ARTHROPLASTY Right 02/09/2022   Procedure: REVERSE SHOULDER ARTHROPLASTY;  Surgeon: Cristy Bonner DASEN, MD;  Location: [facility];  Service: Orthopedics;  Laterality: Right;   SALIVARY GLAND SURGERY Left 1968   TONSILLECTOMY AND ADENOIDECTOMY  1951   Patient Active Problem List   Diagnosis Date Noted   Renal mass 09/25/2024   Lipoma of back 06/26/2024   Flatulence 03/07/2024   Hiatal hernia 01/30/2024   Cervical disc disorder with radiculopathy of cervical region 12/12/2023   Medication intolerance 03/30/2022   Coronary artery calcification seen on CT scan 03/30/2022   Vitamin D  deficiency 12/14/2021   Class 1 obesity due to excess calories without serious comorbidity with body mass index (BMI) of 30.0 to 30.9 in adult 12/14/2021   Sensation of fullness in right ear 10/12/2021   Mixed hyperlipidemia 03/09/2021   Chest  pain of uncertain etiology 03/09/2021   Primary hypertension 01/07/2021   Rash and other nonspecific skin eruption 12/10/2020   Drug reaction 12/10/2020   Sjogren's syndrome 06/23/2020   Essential hypertension, benign 06/23/2020   Episodic lightheadedness 06/23/2020    PCP: Chrystal Lamarr RAMAN, MD   REFERRING PROVIDER: Claudene Arthea HERO, DO  REFERRING DIAG: Balance problem; Coordination problem  THERAPY DIAG:  Other abnormalities of gait and mobility  Muscle weakness (generalized)  Other low back pain  Cervicalgia  Rationale for Evaluation and Treatment: Rehabilitation  ONSET DATE: Chronic   SUBJECTIVE:  SUBJECTIVE STATEMENT: Patient reports she developed clicking on the outside of her left knee and pain continues at the outside of the left hip and thigh.  Eval: Patient reports she is having trouble walking. She states in November of 2022 she has surgery for pinched nerve in her neck and then 18 months later she started having episodes where she would get up and her legs would buckle or give out on her. Her most recent episode was back in August, but this was different because she wasn't having buckling but wasn't walking straight, but her gait has changed and her balance has become an issue. She doesn't feel as steady. She also gets back,  neck, and legs pain when she is grocery shopping or laundry, mainly on the left side. She also notes that she gets leg pain that will wake her up at night and standing will help alleviate the pain. She did see a neurologist and her right side is weaker, and she does get some numbness in her face from time to time, but denies any numbness in hands or feet. She also reports worsening of fine motor tasks.  PERTINENT HISTORY: See PMH above  PAIN:  Are you having pain? Yes:  NPRS scale: 3/10 Pain location: Left lateral hip Pain description: Soreness Aggravating factors: Grocery shopping and laundry Relieving factors: Rest  PRECAUTIONS:  None  PATIENT GOALS: Improve balance and walking   OBJECTIVE:  Note: Objective measures were completed at Evaluation unless otherwise noted. PATIENT SURVEYS:  PSFS: 4 (taking lowest number) Walking, no endurance, balance: 1-7 Laundry and grocery shopping, leg pain to neck: 4-8 Loss of strength of right side: 7  POSTURE:   Rounded shoulder posture  LOWER EXTREMITY MMT:  MMT Right eval Left eval  Hip flexion 4- 4-  Hip extension 3+ 3+  Hip abduction 3+ 3+  Hip adduction    Hip internal rotation    Hip external rotation    Knee flexion 4 4  Knee extension 4 4+  Ankle dorsiflexion    Ankle plantarflexion    Ankle inversion    Ankle eversion     (Blank rows = not tested)  FUNCTIONAL TESTS:  30 seconds chair stand test: 16 seconds SLS: approximately 15 seconds each  GAIT: Distance walked: 180 ft Assistive device utilized: None Level of assistance: Complete Independence Comments: Trendelenburg, decreased arm swing/trunk rotation                                                                                                                               TREATMENT  [facility] Adult PT Treatment:                                                DATE: 11/06/2024 SLR 2 x 20 each Side clamshell with green 3 x 10 each Bridge 3 x 10 Standing heel raises 3 x 15 Standing hip abduction and extension with yellow at ankles 2 x 10 each  Discussed her left lateral hip and thigh pain, and lateral left knee clicking that seems most consistent with gluteal tendinopathy and ITB syndrome symptoms. Discussed limiting amount of repetitive knee flexion/extension movements with squatting, step-ups, etc and incorporating more hip strengthening.  PATIENT EDUCATION:  Education details: HEP update Person educated: Patient Education method: Explanation, Demonstration, Tactile cues, Verbal cues, Handouts Education comprehension: verbalized understanding, returned demonstration, verbal cues required,  tactile cues required, and needs further education  HOME EXERCISE PROGRAM: Access Code: [email or web address]    ASSESSMENT: CLINICAL IMPRESSION: Patient  tolerated therapy well with no adverse effects. She arrived reporting left lateral knee palpable clicking/snapping and increased left lateral hip and thigh pain since last visit. This seems to be consistent with GTPS vs ITB related pain. Therapy focused on continued strengthening for the hips and LE with reduced repetitive knee flexion and extension movements. She does report increased lateral hip and thigh pain with hip abductor strengthening. Updated her HEP to progress strengthening for home and limit repetitive knee flexion and extension movements. Patient would benefit from continued skilled PT to progress mobility and strength in order to reduce pain and maximize functional ability.   Eval: Patient is a 79 y.o. female who was seen today for physical therapy evaluation and treatment for gait and balance difficulties, and weakness. She does exhibit some right > left LE and hip weakness, balance deficits, and gait deviations. She doesn't report any specific neurological symptoms and does not demonstrate any ataxic movements. Unclear etiology of her LE buckling episodes but she has not had these in quite some time. Therapy will focus on progressing her strength, balance training, endurance, and gait to maximize her functional ability.   OBJECTIVE IMPAIRMENTS: Abnormal gait, decreased activity tolerance, decreased balance, decreased strength, postural dysfunction, and pain.   ACTIVITY LIMITATIONS: locomotion level  PARTICIPATION LIMITATIONS: cleaning, laundry, shopping, and community activity  PERSONAL FACTORS: Fitness, Past/current experiences, and Time since onset of injury/illness/exacerbation are also affecting patient's functional outcome.    GOALS: Goals reviewed with patient? Yes  SHORT TERM GOALS: Target date: 11/04/2024  Patient will be I  with initial HEP in order to progress with therapy. Baseline: HEP provided at eval 11/06/2024: independent with initial HEP Goal status: MET  2.  Patient will report pain with shopping and laundry </= 5/10 in order to reduce functional limitations Baseline: 7-8/10 11/07/2023: 3/10  Goal status: MET  LONG TERM GOALS: Target date: 12/02/2024  Patient will be I with final HEP to maintain progress from PT. Baseline: HEP provided at eval Goal status: INITIAL  2.  Patient will report PSFS >/= 7 in order to indicate improvement in their functional ability. Baseline: 4 Goal status: INITIAL  3.  Patient will demonstrate knee strength 5/5 MMT and hip strength >/= 4/5 MMT in order to improve her walking tolerance and mobility Baseline: see limitations above Goal status: INITIAL  4.  Patient will demonstrate SLS >/= 30 seconds in order to improve balance and stability with walking and household tasks Baseline: 15 seconds Goal status: INITIAL   PLAN: PT FREQUENCY: 1-2x/week  PT DURATION: 8 weeks  PLANNED INTERVENTIONS: 97164- PT Re-evaluation, 97750- Physical Performance Testing, 97110-Therapeutic exercises, 97530- Therapeutic activity, 97112- Neuromuscular re-education, 97535- Self Care, 02859- Manual therapy, [phone]- Gait training, Patient/Family education, Balance training, Stair training, Joint mobilization, Joint manipulation, Spinal manipulation, and Spinal mobilization  PLAN FOR NEXT SESSION: Review HEP and progress PRN, progress LE/hip/core/posture strengthening, balance training, gait training, progress to more upright and weight bearing exercises   Elaine Daring, PT, DPT, LAT, ATC 11/06/24  12:24 PM Phone: [phone] Fax: [phone]   "

## 2024-11-11 ENCOUNTER — Encounter: Admitting: Physical Therapy

## 2024-11-13 ENCOUNTER — Encounter: Payer: Self-pay | Admitting: Physical Therapy

## 2024-11-13 ENCOUNTER — Ambulatory Visit: Admitting: Physical Therapy

## 2024-11-13 ENCOUNTER — Other Ambulatory Visit: Payer: Self-pay

## 2024-11-13 DIAGNOSIS — M5459 Other low back pain: Secondary | ICD-10-CM | POA: Diagnosis not present

## 2024-11-13 DIAGNOSIS — M542 Cervicalgia: Secondary | ICD-10-CM

## 2024-11-13 DIAGNOSIS — M6281 Muscle weakness (generalized): Secondary | ICD-10-CM

## 2024-11-13 DIAGNOSIS — R2689 Other abnormalities of gait and mobility: Secondary | ICD-10-CM

## 2024-11-13 NOTE — Therapy (Signed)
 " OUTPATIENT PHYSICAL THERAPY TREATMENT   Patient Name: Regina Marsh MRN: [id number] DOB:29-Sep-1946, 79 y.o., female Today's Date: 11/13/2024   END OF SESSION:  PT End of Session - 11/13/24 1027     Visit Number 10    Number of Visits 16    Date for Recertification  12/02/24    Authorization Type [employer] MCR    PT Start Time 1017    PT Stop Time 1100    PT Time Calculation (min) 43 min    Activity Tolerance Patient tolerated treatment well    Behavior During Therapy [facility] for tasks assessed/performed                   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   REVERSE SHOULDER ARTHROPLASTY Right 02/09/2022   Procedure: REVERSE SHOULDER ARTHROPLASTY;  Surgeon: Cristy Bonner DASEN, MD;  Location: [facility];  Service: Orthopedics;  Laterality: Right;   SALIVARY GLAND SURGERY Left 1968   TONSILLECTOMY AND ADENOIDECTOMY  1951   Patient Active Problem List   Diagnosis Date Noted   Renal mass 09/25/2024   Lipoma of back 06/26/2024   Flatulence 03/07/2024   Hiatal hernia 01/30/2024   Cervical disc disorder with radiculopathy of cervical region 12/12/2023   Medication intolerance 03/30/2022   Coronary artery calcification seen on CT scan 03/30/2022   Vitamin D  deficiency 12/14/2021   Class 1 obesity due to excess calories without serious comorbidity with body mass index (BMI) of 30.0 to 30.9 in adult 12/14/2021   Sensation of fullness in right ear 10/12/2021   Mixed hyperlipidemia 03/09/2021    Chest pain of uncertain etiology 03/09/2021   Primary hypertension 01/07/2021   Rash and other nonspecific skin eruption 12/10/2020   Drug reaction 12/10/2020   Sjogren's syndrome 06/23/2020   Essential hypertension, benign 06/23/2020   Episodic lightheadedness 06/23/2020    PCP: Chrystal Lamarr RAMAN, MD   REFERRING PROVIDER: Claudene Arthea HERO, DO  REFERRING DIAG: Balance problem; Coordination problem  THERAPY DIAG:  Other abnormalities of gait and mobility  Muscle weakness (generalized)  Other low back pain  Cervicalgia  Rationale for Evaluation and Treatment: Rehabilitation  ONSET DATE: Chronic   SUBJECTIVE:  SUBJECTIVE STATEMENT: Patient reports that she is walking and moving better, and feels stronger, but continues to have the clicking on the outside of the left knee with straightening and bending movements, and stairs can aggravate her left knee. She reports the left hip can not bother her at all, and then sometimes the left hip can bother her quite a bit. She reports that doing a lot of repetitive bending can aggravate her left lower back and hip, and also doing a lot of overhead reaching can aggravate her left neck and shoulder.  Eval: Patient reports she is having trouble walking. She states in November of 2022 she has surgery for pinched nerve  in her neck and then 18 months later she started having episodes where she would get up and her legs would buckle or give out on her. Her most recent episode was back in August, but this was different because she wasn't having buckling but wasn't walking straight, but her gait has changed and her balance has become an issue. She doesn't feel as steady. She also gets back, neck, and legs pain when she is grocery shopping or laundry, mainly on the left side. She also notes that she gets leg pain that will wake her up at night and standing will help alleviate the pain. She did see a neurologist and her right side is weaker, and she does  get some numbness in her face from time to time, but denies any numbness in hands or feet. She also reports worsening of fine motor tasks.  PERTINENT HISTORY: See PMH above  PAIN:  Are you having pain? Yes:  NPRS scale: 3/10 Pain location: Left lateral hip Pain description: Soreness Aggravating factors: Grocery shopping and laundry Relieving factors: Rest  PRECAUTIONS: None  PATIENT GOALS: Improve balance and walking   OBJECTIVE:  Note: Objective measures were completed at Evaluation unless otherwise noted. PATIENT SURVEYS:  PSFS: 4 (taking lowest number) Walking, no endurance, balance: 1-7 Laundry and grocery shopping, leg pain to neck: 4-8 Loss of strength of right side: 7  POSTURE:   Rounded shoulder posture  LOWER EXTREMITY MMT:  MMT Right eval Left eval  Hip flexion 4- 4-  Hip extension 3+ 3+  Hip abduction 3+ 3+  Hip adduction    Hip internal rotation    Hip external rotation    Knee flexion 4 4  Knee extension 4 4+  Ankle dorsiflexion    Ankle plantarflexion    Ankle inversion    Ankle eversion     (Blank rows = not tested)  FUNCTIONAL TESTS:  30 seconds chair stand test: 16 seconds SLS: approximately 15 seconds each  GAIT: Distance walked: 180 ft Assistive device utilized: None Level of assistance: Complete Independence Comments: Trendelenburg, decreased arm swing/trunk rotation                                                                                                                               TREATMENT  [facility] Adult PT Treatment:                                                DATE: 11/13/2024 UBE L2 x 5 min (fwd/bwd) to improve endurance and workload capacity Standing wall slide for shoulder elevation 2 x 10 Supine dowel shoulder flexion 2 x 10 Supine shoulder horizontal abduction with yellow 2 x 10 SLR with 1# 2 x 15 each Bridge with green at knees 3 x 10 Side clamshell with green 3 x 10 each  Discussed continuing to avoid repetitive  knee bending and straightening, reducing squats and stairs as able.   PATIENT EDUCATION:  Education details: HEP update Person educated: Patient Education method: Explanation, Demonstration, Tactile cues, Verbal cues, Handouts Education comprehension: verbalized understanding, returned demonstration, verbal cues required, tactile cues required, and needs further education  HOME EXERCISE PROGRAM: Access Code: [email or web address]    ASSESSMENT: CLINICAL IMPRESSION: Patient tolerated therapy well with no adverse effects. She continues to exhibit palpable clicking on the lateral aspect of the left knee, unclear whether this seems to be joint related vs ITB. Therapy continued to focus on strengthening for the hips and LE with minimal repetitive knee flexion/extension movements, and incorporated more shoulder and postural exercises as she reports repetitive reaching can aggravate her symptoms as well. She did well in therapy this visit but does report greater difficulty with hip strengthening on the left. Updated her HEP to progress shoulder exercise. Patient would benefit from continued skilled PT to progress mobility and strength in order to reduce pain and maximize functional ability.   Eval: Patient is a 79 y.o. female who was seen today for physical therapy evaluation and treatment for gait and balance difficulties, and weakness. She does exhibit some right > left LE and hip weakness, balance deficits, and gait deviations. She doesn't report any specific neurological symptoms and does not demonstrate any ataxic movements. Unclear etiology of her LE buckling episodes but she has not had these in quite some time. Therapy will focus on progressing her strength, balance training, endurance, and gait to maximize her functional ability.   OBJECTIVE IMPAIRMENTS: Abnormal gait, decreased activity tolerance, decreased balance, decreased strength, postural dysfunction, and pain.   ACTIVITY LIMITATIONS: locomotion  level  PARTICIPATION LIMITATIONS: cleaning, laundry, shopping, and community activity  PERSONAL FACTORS: Fitness, Past/current experiences, and Time since onset of injury/illness/exacerbation are also affecting patient's functional outcome.    GOALS: Goals reviewed with patient? Yes  SHORT TERM GOALS: Target date: 11/04/2024  Patient will be I with initial HEP in order to progress with therapy. Baseline: HEP provided at eval 11/06/2024: independent with initial HEP Goal status: MET  2.  Patient will report pain with shopping and laundry </= 5/10 in order to reduce functional limitations Baseline: 7-8/10 11/07/2023: 3/10  Goal status: MET  LONG TERM GOALS: Target date: 12/02/2024  Patient will be I with final HEP to maintain progress from PT. Baseline: HEP provided at eval Goal status: INITIAL  2.  Patient will report PSFS >/= 7 in order to indicate improvement in their functional ability. Baseline: 4 Goal status: INITIAL  3.  Patient will demonstrate knee strength 5/5 MMT and hip strength >/= 4/5 MMT in order to improve her walking tolerance and mobility Baseline: see limitations above Goal status: INITIAL  4.  Patient will demonstrate SLS >/= 30 seconds in order to improve balance and stability with walking and household tasks Baseline: 15 seconds Goal status: INITIAL   PLAN: PT FREQUENCY: 1-2x/week  PT DURATION: 8 weeks  PLANNED INTERVENTIONS: 97164- PT Re-evaluation, 97750- Physical Performance Testing, 97110-Therapeutic exercises, 97530- Therapeutic activity, 97112- Neuromuscular re-education, 97535- Self Care, [id number]- Manual therapy, [id number]- Gait training, Patient/Family education, Balance training, Stair training, Joint mobilization, Joint manipulation, Spinal manipulation, and Spinal mobilization  PLAN FOR NEXT SESSION: Review HEP and progress PRN, progress LE/hip/core/posture strengthening, balance training, gait training, progress to more upright and weight bearing  exercises   Elaine Daring, PT, DPT, LAT, ATC 11/13/24  11:42 AM Phone: [phone] Fax: [phone]   "

## 2024-11-13 NOTE — Patient Instructions (Signed)
 Access Code: [email or web address] URL: [email or web address] Date: 11/13/2024 Prepared by: Elaine Daring  Exercises - Active Straight Leg Raise with Quad Set  - 1 x daily - 3 sets - 15 reps - Clam with Resistance  - 1 x daily - 3 sets - 10 reps - Bridge with Resistance  - 1 x daily - 3 sets - 10 reps - Heel Raises with Counter Support  - 1 x daily - 3 sets - 15 reps - Standing Hip Extension with Resistance at Ankles and Counter Support  - 1 x daily - 2 sets - 10 reps - Standing Hip Abduction with Resistance at Ankles and Counter Support  - 1 x daily - 2 sets - 10 reps - Standing Tandem Balance with Counter Support  - 1 x daily - 3 reps - 30 seconds hold - Standing Row with Anchored Resistance  - 1 x daily - 3 sets - 10 reps - Standing Shoulder Horizontal Abduction with Resistance  - 1 x daily - 3 sets - 10 reps - Standing shoulder flexion wall slides  - 1 x daily - 3 sets - 10 reps

## 2024-11-19 ENCOUNTER — Encounter: Admitting: Physical Therapy

## 2024-11-21 ENCOUNTER — Encounter: Admitting: Physical Therapy

## 2024-11-25 ENCOUNTER — Encounter: Admitting: Physical Therapy

## 2024-11-26 ENCOUNTER — Ambulatory Visit: Admitting: Family Medicine

## 2024-11-27 ENCOUNTER — Encounter: Admitting: Physical Therapy

## 2024-12-02 ENCOUNTER — Encounter: Admitting: Physical Therapy

## 2024-12-04 ENCOUNTER — Encounter: Admitting: Physical Therapy
